# Patient Record
Sex: Female | Born: 1950
Health system: Southern US, Community
[De-identification: ages and names within clinical notes are randomized; demographics above are authoritative.]

## PROBLEM LIST (undated history)

## (undated) DIAGNOSIS — J302 Other seasonal allergic rhinitis: Secondary | ICD-10-CM

## (undated) DIAGNOSIS — H9190 Unspecified hearing loss, unspecified ear: Secondary | ICD-10-CM

## (undated) DIAGNOSIS — Z803 Family history of malignant neoplasm of breast: Secondary | ICD-10-CM

## (undated) DIAGNOSIS — K469 Unspecified abdominal hernia without obstruction or gangrene: Secondary | ICD-10-CM

## (undated) DIAGNOSIS — C50919 Malignant neoplasm of unspecified site of unspecified female breast: Secondary | ICD-10-CM

## (undated) DIAGNOSIS — T7840XA Allergy, unspecified, initial encounter: Secondary | ICD-10-CM

## (undated) DIAGNOSIS — C801 Malignant (primary) neoplasm, unspecified: Secondary | ICD-10-CM

## (undated) DIAGNOSIS — Z8619 Personal history of other infectious and parasitic diseases: Secondary | ICD-10-CM

## (undated) DIAGNOSIS — L570 Actinic keratosis: Secondary | ICD-10-CM

## (undated) DIAGNOSIS — Z801 Family history of malignant neoplasm of trachea, bronchus and lung: Secondary | ICD-10-CM

## (undated) HISTORY — PX: COLONOSCOPY: SHX174

## (undated) HISTORY — DX: Malignant neoplasm of unspecified site of unspecified female breast: C50.919

## (undated) HISTORY — PX: TUBAL LIGATION: SHX77

## (undated) HISTORY — DX: Allergy, unspecified, initial encounter: T78.40XA

## (undated) HISTORY — DX: Other seasonal allergic rhinitis: J30.2

## (undated) HISTORY — DX: Unspecified hearing loss, unspecified ear: H91.90

## (undated) HISTORY — DX: Family history of malignant neoplasm of breast: Z80.3

## (undated) HISTORY — DX: Family history of malignant neoplasm of trachea, bronchus and lung: Z80.1

## (undated) HISTORY — DX: Personal history of other infectious and parasitic diseases: Z86.19

## (undated) HISTORY — DX: Actinic keratosis: L57.0

## (undated) HISTORY — PX: EYE SURGERY: SHX253

---

## 1995-07-13 DIAGNOSIS — E669 Obesity, unspecified: Secondary | ICD-10-CM | POA: Insufficient documentation

## 1997-07-03 DIAGNOSIS — K219 Gastro-esophageal reflux disease without esophagitis: Secondary | ICD-10-CM | POA: Insufficient documentation

## 1999-08-04 DIAGNOSIS — E782 Mixed hyperlipidemia: Secondary | ICD-10-CM | POA: Insufficient documentation

## 2000-06-15 DIAGNOSIS — D539 Nutritional anemia, unspecified: Secondary | ICD-10-CM | POA: Insufficient documentation

## 2000-07-18 DIAGNOSIS — Z86010 Personal history of colonic polyps: Secondary | ICD-10-CM | POA: Insufficient documentation

## 2000-09-07 ENCOUNTER — Other Ambulatory Visit: Admission: RE | Admit: 2000-09-07 | Discharge: 2000-09-07 | Payer: Self-pay | Admitting: Family Medicine

## 2001-01-25 DIAGNOSIS — J302 Other seasonal allergic rhinitis: Secondary | ICD-10-CM | POA: Insufficient documentation

## 2003-06-07 LAB — HM DEXA SCAN: HM DEXA SCAN: NORMAL

## 2004-06-18 ENCOUNTER — Ambulatory Visit: Payer: Self-pay | Admitting: Family Medicine

## 2005-08-05 ENCOUNTER — Ambulatory Visit: Payer: Self-pay | Admitting: Family Medicine

## 2006-07-28 ENCOUNTER — Ambulatory Visit: Payer: Self-pay | Admitting: Gastroenterology

## 2006-08-17 ENCOUNTER — Ambulatory Visit: Payer: Self-pay | Admitting: Family Medicine

## 2006-08-22 ENCOUNTER — Ambulatory Visit: Payer: Self-pay | Admitting: Family Medicine

## 2007-08-23 ENCOUNTER — Ambulatory Visit: Payer: Self-pay | Admitting: Family Medicine

## 2007-09-20 DIAGNOSIS — G43909 Migraine, unspecified, not intractable, without status migrainosus: Secondary | ICD-10-CM | POA: Insufficient documentation

## 2007-10-02 ENCOUNTER — Emergency Department: Payer: Self-pay | Admitting: Unknown Physician Specialty

## 2007-10-02 ENCOUNTER — Other Ambulatory Visit: Payer: Self-pay

## 2008-09-03 DIAGNOSIS — R030 Elevated blood-pressure reading, without diagnosis of hypertension: Secondary | ICD-10-CM | POA: Insufficient documentation

## 2008-09-03 DIAGNOSIS — I10 Essential (primary) hypertension: Secondary | ICD-10-CM | POA: Insufficient documentation

## 2008-09-05 ENCOUNTER — Ambulatory Visit: Payer: Self-pay | Admitting: Family Medicine

## 2009-10-28 ENCOUNTER — Ambulatory Visit: Payer: Self-pay | Admitting: Family Medicine

## 2010-11-06 ENCOUNTER — Ambulatory Visit: Payer: Self-pay | Admitting: Family Medicine

## 2011-04-21 LAB — HM DEXA SCAN: HM DEXA SCAN: NORMAL

## 2011-11-23 ENCOUNTER — Ambulatory Visit: Payer: Self-pay | Admitting: Family Medicine

## 2012-11-13 ENCOUNTER — Ambulatory Visit: Payer: Self-pay | Admitting: Podiatry

## 2012-11-20 ENCOUNTER — Encounter: Payer: Self-pay | Admitting: Podiatry

## 2012-11-20 ENCOUNTER — Ambulatory Visit (INDEPENDENT_AMBULATORY_CARE_PROVIDER_SITE_OTHER): Payer: BC Managed Care – PPO | Admitting: Podiatry

## 2012-11-20 VITALS — BP 123/74 | HR 69 | Resp 16 | Ht 63.0 in | Wt 175.0 lb

## 2012-11-20 DIAGNOSIS — M722 Plantar fascial fibromatosis: Secondary | ICD-10-CM

## 2012-11-20 NOTE — Progress Notes (Signed)
Berdina presents today for followup of her plantar fasciitis right foot. She states that sometimes is good and sometimes as bad. She did fine walking at the fair but the next day she knew it and her feet were hurting.  Objective: Pulses are palpable bilateral. Pain on palpation medial calcaneal tubercle right.  Assessment: Plantar fasciitis right.  Plan: Skin her today for a pair orthotics she will continue her anti-inflammatories and all other conservative therapies followup with her once her orthotics come in.

## 2012-11-20 NOTE — Patient Instructions (Signed)
Plantar Fasciitis (Heel Spur Syndrome) with Rehab The plantar fascia is a fibrous, ligament-like, soft-tissue structure that spans the bottom of the foot. Plantar fasciitis is a condition that causes pain in the foot due to inflammation of the tissue. SYMPTOMS   Pain and tenderness on the underneath side of the foot.  Pain that worsens with standing or walking. CAUSES  Plantar fasciitis is caused by irritation and injury to the plantar fascia on the underneath side of the foot. Common mechanisms of injury include:  Direct trauma to bottom of the foot.  Damage to a small nerve that runs under the foot where the main fascia attaches to the heel bone.  Stress placed on the plantar fascia due to bone spurs. RISK INCREASES WITH:   Activities that place stress on the plantar fascia (running, jumping, pivoting, or cutting).  Poor strength and flexibility.  Improperly fitted shoes.  Tight calf muscles.  Flat feet.  Failure to warm-up properly before activity.  Obesity. PREVENTION  Warm up and stretch properly before activity.  Allow for adequate recovery between workouts.  Maintain physical fitness:  Strength, flexibility, and endurance.  Cardiovascular fitness.  Maintain a health body weight.  Avoid stress on the plantar fascia.  Wear properly fitted shoes, including arch supports for individuals who have flat feet. PROGNOSIS  If treated properly, then the symptoms of plantar fasciitis usually resolve without surgery. However, occasionally surgery is necessary. RELATED COMPLICATIONS   Recurrent symptoms that may result in a chronic condition.  Problems of the lower back that are caused by compensating for the injury, such as limping.  Pain or weakness of the foot during push-off following surgery.  Chronic inflammation, scarring, and partial or complete fascia tear, occurring more often from repeated injections. TREATMENT  Treatment initially involves the use of  ice and medication to help reduce pain and inflammation. The use of strengthening and stretching exercises may help reduce pain with activity, especially stretches of the Achilles tendon. These exercises may be performed at home or with a therapist. Your caregiver may recommend that you use heel cups of arch supports to help reduce stress on the plantar fascia. Occasionally, corticosteroid injections are given to reduce inflammation. If symptoms persist for greater than 6 months despite non-surgical (conservative), then surgery may be recommended.  MEDICATION   If pain medication is necessary, then nonsteroidal anti-inflammatory medications, such as aspirin and ibuprofen, or other minor pain relievers, such as acetaminophen, are often recommended.  Do not take pain medication within 7 days before surgery.  Prescription pain relievers may be given if deemed necessary by your caregiver. Use only as directed and only as much as you need.  Corticosteroid injections may be given by your caregiver. These injections should be reserved for the most serious cases, because they may only be given a certain number of times. HEAT AND COLD  Cold treatment (icing) relieves pain and reduces inflammation. Cold treatment should be applied for 10 to 15 minutes every 2 to 3 hours for inflammation and pain and immediately after any activity that aggravates your symptoms. Use ice packs or massage the area with a piece of ice (ice massage).  Heat treatment may be used prior to performing the stretching and strengthening activities prescribed by your caregiver, physical therapist, or athletic trainer. Use a heat pack or soak the injury in warm water. SEEK IMMEDIATE MEDICAL CARE IF:  Treatment seems to offer no benefit, or the condition worsens.  Any medications produce adverse side effects. EXERCISES RANGE   OF MOTION (ROM) AND STRETCHING EXERCISES - Plantar Fasciitis (Heel Spur Syndrome) These exercises may help you  when beginning to rehabilitate your injury. Your symptoms may resolve with or without further involvement from your physician, physical therapist or athletic trainer. While completing these exercises, remember:   Restoring tissue flexibility helps normal motion to return to the joints. This allows healthier, less painful movement and activity.  An effective stretch should be held for at least 30 seconds.  A stretch should never be painful. You should only feel a gentle lengthening or release in the stretched tissue. RANGE OF MOTION - Toe Extension, Flexion  Sit with your right / left leg crossed over your opposite knee.  Grasp your toes and gently pull them back toward the top of your foot. You should feel a stretch on the bottom of your toes and/or foot.  Hold this stretch for __________ seconds.  Now, gently pull your toes toward the bottom of your foot. You should feel a stretch on the top of your toes and or foot.  Hold this stretch for __________ seconds. Repeat __________ times. Complete this stretch __________ times per day.  RANGE OF MOTION - Ankle Dorsiflexion, Active Assisted  Remove shoes and sit on a chair that is preferably not on a carpeted surface.  Place right / left foot under knee. Extend your opposite leg for support.  Keeping your heel down, slide your right / left foot back toward the chair until you feel a stretch at your ankle or calf. If you do not feel a stretch, slide your bottom forward to the edge of the chair, while still keeping your heel down.  Hold this stretch for __________ seconds. Repeat __________ times. Complete this stretch __________ times per day.  STRETCH  Gastroc, Standing  Place hands on wall.  Extend right / left leg, keeping the front knee somewhat bent.  Slightly point your toes inward on your back foot.  Keeping your right / left heel on the floor and your knee straight, shift your weight toward the wall, not allowing your back to  arch.  You should feel a gentle stretch in the right / left calf. Hold this position for __________ seconds. Repeat __________ times. Complete this stretch __________ times per day. STRETCH  Soleus, Standing  Place hands on wall.  Extend right / left leg, keeping the other knee somewhat bent.  Slightly point your toes inward on your back foot.  Keep your right / left heel on the floor, bend your back knee, and slightly shift your weight over the back leg so that you feel a gentle stretch deep in your back calf.  Hold this position for __________ seconds. Repeat __________ times. Complete this stretch __________ times per day. STRETCH  Gastrocsoleus, Standing  Note: This exercise can place a lot of stress on your foot and ankle. Please complete this exercise only if specifically instructed by your caregiver.   Place the ball of your right / left foot on a step, keeping your other foot firmly on the same step.  Hold on to the wall or a rail for balance.  Slowly lift your other foot, allowing your body weight to press your heel down over the edge of the step.  You should feel a stretch in your right / left calf.  Hold this position for __________ seconds.  Repeat this exercise with a slight bend in your right / left knee. Repeat __________ times. Complete this stretch __________ times per day.    STRENGTHENING EXERCISES - Plantar Fasciitis (Heel Spur Syndrome)  These exercises may help you when beginning to rehabilitate your injury. They may resolve your symptoms with or without further involvement from your physician, physical therapist or athletic trainer. While completing these exercises, remember:   Muscles can gain both the endurance and the strength needed for everyday activities through controlled exercises.  Complete these exercises as instructed by your physician, physical therapist or athletic trainer. Progress the resistance and repetitions only as guided. STRENGTH - Towel  Curls  Sit in a chair positioned on a non-carpeted surface.  Place your foot on a towel, keeping your heel on the floor.  Pull the towel toward your heel by only curling your toes. Keep your heel on the floor.  If instructed by your physician, physical therapist or athletic trainer, add ____________________ at the end of the towel. Repeat __________ times. Complete this exercise __________ times per day. STRENGTH - Ankle Inversion  Secure one end of a rubber exercise band/tubing to a fixed object (table, pole). Loop the other end around your foot just before your toes.  Place your fists between your knees. This will focus your strengthening at your ankle.  Slowly, pull your big toe up and in, making sure the band/tubing is positioned to resist the entire motion.  Hold this position for __________ seconds.  Have your muscles resist the band/tubing as it slowly pulls your foot back to the starting position. Repeat __________ times. Complete this exercises __________ times per day.  Document Released: 01/04/2005 Document Revised: 03/29/2011 Document Reviewed: 04/18/2008 [employer] Patient Information 2014 [employer]. Plantar Fasciitis Plantar fasciitis is a common condition that causes foot pain. It is soreness (inflammation) of the band of tough fibrous tissue on the bottom of the foot that runs from the heel bone (calcaneus) to the ball of the foot. The cause of this soreness may be from excessive standing, poor fitting shoes, running on hard surfaces, being overweight, having an abnormal walk, or overuse (this is common in runners) of the painful foot or feet. It is also common in aerobic exercise dancers and ballet dancers. SYMPTOMS  Most people with plantar fasciitis complain of:  Severe pain in the morning on the bottom of their foot especially when taking the first steps out of bed. This pain recedes after a few minutes of walking.  Severe pain is experienced also during walking  following a long period of inactivity.  Pain is worse when walking barefoot or up stairs DIAGNOSIS   Your caregiver will diagnose this condition by examining and feeling your foot.  Special tests such as X-rays of your foot, are usually not needed. PREVENTION   Consult a sports medicine professional before beginning a new exercise program.  Walking programs offer a good workout. With walking there is a lower chance of overuse injuries common to runners. There is less impact and less jarring of the joints.  Begin all new exercise programs slowly. If problems or pain develop, decrease the amount of time or distance until you are at a comfortable level.  Wear good shoes and replace them regularly.  Stretch your foot and the heel cords at the back of the ankle (Achilles tendon) both before and after exercise.  Run or exercise on even surfaces that are not hard. For example, asphalt is better than pavement.  Do not run barefoot on hard surfaces.  If using a treadmill, vary the incline.  Do not continue to workout if you have foot or joint   problems. Seek professional help if they do not improve. HOME CARE INSTRUCTIONS   Avoid activities that cause you pain until you recover.  Use ice or cold packs on the problem or painful areas after working out.  Only take over-the-counter or prescription medicines for pain, discomfort, or fever as directed by your caregiver.  Soft shoe inserts or athletic shoes with air or gel sole cushions may be helpful.  If problems continue or become more severe, consult a sports medicine caregiver or your own health care provider. Cortisone is a potent anti-inflammatory medication that may be injected into the painful area. You can discuss this treatment with your caregiver. MAKE SURE YOU:   Understand these instructions.  Will watch your condition.  Will get help right away if you are not doing well or get worse. Document Released: 09/29/2000 Document  Revised: 03/29/2011 Document Reviewed: 11/29/2007 Exit[employer] Patient Information 2014 [employer].  

## 2012-11-23 ENCOUNTER — Ambulatory Visit: Payer: Self-pay | Admitting: Family Medicine

## 2012-12-18 ENCOUNTER — Ambulatory Visit (INDEPENDENT_AMBULATORY_CARE_PROVIDER_SITE_OTHER): Payer: BC Managed Care – PPO | Admitting: Podiatry

## 2012-12-18 DIAGNOSIS — M778 Other enthesopathies, not elsewhere classified: Secondary | ICD-10-CM

## 2012-12-18 DIAGNOSIS — M7750 Other enthesopathy of unspecified foot: Secondary | ICD-10-CM

## 2012-12-18 NOTE — Patient Instructions (Signed)

## 2012-12-18 NOTE — Progress Notes (Signed)
Pt presents for orthotic pick up , verbal and written instructions given 

## 2013-01-15 ENCOUNTER — Ambulatory Visit: Payer: BC Managed Care – PPO | Admitting: Podiatry

## 2014-01-29 LAB — LIPID PANEL
Cholesterol: 249 mg/dL — AB (ref 0–200)
HDL: 66 mg/dL (ref 35–70)
LDL Cholesterol: 155 mg/dL
LDl/HDL Ratio: 2.3
Triglycerides: 140 mg/dL (ref 40–160)

## 2014-01-29 LAB — CBC AND DIFFERENTIAL
HCT: 40 % (ref 36–46)
HEMOGLOBIN: 13.5 g/dL (ref 12.0–16.0)
NEUTROS ABS: 1 /uL
PLATELETS: 244 10*3/uL (ref 150–399)
WBC: 6.1 10^3/mL

## 2014-01-29 LAB — BASIC METABOLIC PANEL
BUN: 15 mg/dL (ref 4–21)
Creatinine: 0.7 mg/dL (ref 0.5–1.1)

## 2014-01-29 LAB — TSH: TSH: 0.9 u[IU]/mL (ref 0.41–5.90)

## 2014-01-29 LAB — HEPATIC FUNCTION PANEL
ALT: 23 U/L (ref 7–35)
AST: 21 U/L (ref 13–35)
Alkaline Phosphatase: 93 U/L (ref 25–125)
Bilirubin, Total: 0.4 mg/dL

## 2014-02-15 ENCOUNTER — Ambulatory Visit: Payer: Self-pay | Admitting: Family Medicine

## 2014-12-26 LAB — HM COLONOSCOPY

## 2015-01-27 ENCOUNTER — Encounter: Payer: Self-pay | Admitting: Family Medicine

## 2015-01-29 ENCOUNTER — Encounter: Payer: Self-pay | Admitting: Family Medicine

## 2015-01-29 ENCOUNTER — Ambulatory Visit (INDEPENDENT_AMBULATORY_CARE_PROVIDER_SITE_OTHER): Payer: BLUE CROSS/BLUE SHIELD | Admitting: Family Medicine

## 2015-01-29 VITALS — BP 110/62 | HR 62 | Temp 98.4°F | Resp 16 | Ht 62.5 in | Wt 165.0 lb

## 2015-01-29 DIAGNOSIS — Z1239 Encounter for other screening for malignant neoplasm of breast: Secondary | ICD-10-CM

## 2015-01-29 DIAGNOSIS — Z0000 Encounter for general adult medical examination without abnormal findings: Secondary | ICD-10-CM | POA: Diagnosis not present

## 2015-01-29 NOTE — Progress Notes (Signed)
Patient ID: Elizabeth Morgan, female   DOB: November 20, 1950, 65 y.o.   MRN: [id number]       Patient: Elizabeth Morgan, Female    DOB: 03/06/1950, 65 y.o.   MRN: [id number] Visit Date: 01/29/2015  Today's Provider: Wilhemena Durie, MD   Chief Complaint  Patient presents with  . Annual Exam   Subjective:    Annual physical exam Elizabeth Morgan is a 65 y.o. female who presents today for health maintenance and complete physical. She feels fairly well. She reports she is exercising when she can, she has been sick with a URI on and off since December. She reports she is sleeping well.  ----------------------------------------------------------------- Mammogram- 02/15/14- WNL BMD- 04/21/11-Normal Colonoscopy- 01/01/10  Pap- 04/13/11- Normal  Pt reports that she has been battling a URI since about Christmas. She is coughing up clear/yellow sputum. She has nasal congestion runny nose. She does not have any sinus pain for pressure right now but she has been having it. She is hoarse today and she says that comes and goes.  Review of Systems  Constitutional: Positive for fatigue.  HENT: Positive for congestion and rhinorrhea.   Eyes: Negative.   Respiratory: Positive for cough.   Cardiovascular: Negative.   Gastrointestinal: Negative.   Endocrine: Negative.   Genitourinary: Negative.   Musculoskeletal: Negative.   Skin: Negative.   Allergic/Immunologic: Negative.   Neurological: Negative.   Hematological: Negative.   Psychiatric/Behavioral: Negative.     Social History      She  reports that she has quit smoking. She has never used smokeless tobacco. She reports that she drinks alcohol. She reports that she does not use illicit drugs.       Social History   Social History  . Marital Status: Married    Spouse Name: N/A  . Number of Children: N/A  . Years of Education: N/A   Social History Main Topics  . Smoking status: Former Smoker -- 10 years  . Smokeless tobacco: Never  Used     Comment: Quit about 30 years ago  . Alcohol Use: Yes     Comment: occasionally  . Drug Use: No  . Sexual Activity: Not Asked   Other Topics Concern  . None   Social History Narrative    Past Medical History  Diagnosis Date  . Hearing loss   . Seasonal allergies      Patient Active Problem List   Diagnosis Date Noted  . Essential (primary) hypertension 09/03/2008  . Headache, migraine 09/20/2007  . Allergic rhinitis, seasonal 01/25/2001  . History of colon polyps 07/18/2000  . Deficiency anemia 06/15/2000  . Combined fat and carbohydrate induced hyperlipemia 08/04/1999  . Acid reflux 07/03/1997  . Adiposity 07/13/1995    Past Surgical History  Procedure Laterality Date  . Cesarean section    . Tubal ligation    . Eye surgery Bilateral     X 2    Family History        Family Status  Relation Status Death Age  . Mother Alive   . Father Deceased 59  . Sister Deceased   . Son Alive         Her family history includes Breast cancer in her sister; Dementia in her father and mother; Heart disease in her father; Hypertension in her father; Lung cancer in her father.    Allergies  Allergen Reactions  . Aleve [Naproxen Sodium] Other (See Comments)    BURN STOMACH  . Aspirin  Other (See Comments)    BURN STOMACH  . Chicken Protein   . Codeine Nausea And Vomiting    Previous Medications   CELECOXIB (CELEBREX) 200 MG CAPSULE    Take 200 mg by mouth daily. PRN   CETIRIZINE HCL (ZYRTEC ALLERGY PO)    Take by mouth daily.    Patient Care Team: Jerrol Banana., MD as PCP - General (Family Medicine)     Objective:   Vitals: BP 110/62 mmHg  Pulse 62  Temp(Src) 98.4 F (36.9 C) (Oral)  Resp 16  Ht 5' 2.5" (1.588 m)  Wt 165 lb (74.844 kg)  BMI 29.68 kg/m2  SpO2 97%   Physical Exam  Constitutional: She is oriented to person, place, and time. She appears well-developed and well-nourished.  HENT:  Head: Normocephalic and atraumatic.  Right  Ear: External ear normal.  Left Ear: External ear normal.  Nose: Nose normal.  Mouth/Throat: Oropharynx is clear and moist.  Eyes: Conjunctivae and EOM are normal. Pupils are equal, round, and reactive to light.  Neck: Normal range of motion. Neck supple.  Cardiovascular: Normal rate, regular rhythm, normal heart sounds and intact distal pulses.   Breasts are dense but no masses.  Pulmonary/Chest: Effort normal and breath sounds normal.  Abdominal: Soft. Bowel sounds are normal.  Genitourinary:  Deferred until next year. She had pap in 2013.   Musculoskeletal: Normal range of motion.  Neurological: She is alert and oriented to person, place, and time. She has normal reflexes.  Skin: Skin is warm and dry.  Psychiatric: She has a normal mood and affect. Her behavior is normal. Judgment and thought content normal.     Depression Screen No flowsheet data found.    Assessment & Plan:     Routine Health Maintenance and Physical Exam- Pt needs Tdap and Zoster vaccines, will wait on these until she gets over URI or in the spring.  Exercise Activities and Dietary recommendations Goals    None      Immunization History  Administered Date(s) Administered  . Td 05/27/2003    Health Maintenance  Topic Date Due  . Hepatitis C Screening  [birth year]  . HIV Screening  09/25/1967  . PAP SMEAR  09/24/1973  . MAMMOGRAM  09/25/2002  . COLONOSCOPY  09/25/2002  . ZOSTAVAX  09/24/2012  . TETANUS/TDAP  05/26/2013  . INFLUENZA VACCINE  12/29/2015 (Originally 08/19/2014)     colonoscopy December 2016 by Dr. Gustavo Lah. Repeat 2021 for  polyp Discussed health benefits of physical activity, and encouraged her to engage in regular exercise appropriate for her age and condition.   URI --since Christmas Z-Pak if she does not improve. I have done the exam and reviewed the above chart and it is accurate to the best of my  knowledge.  --------------------------------------------------------------------

## 2015-01-30 ENCOUNTER — Telehealth: Payer: Self-pay

## 2015-01-30 LAB — LIPID PANEL WITH LDL/HDL RATIO
Cholesterol, Total: 225 mg/dL — ABNORMAL HIGH (ref 100–199)
HDL: 61 mg/dL (ref >39)
LDL CALC: 143 mg/dL — AB (ref 0–99)
LDl/HDL Ratio: 2.3 ratio units (ref 0.0–3.2)
Triglycerides: 105 mg/dL (ref 0–149)
VLDL CHOLESTEROL CAL: 21 mg/dL (ref 5–40)

## 2015-01-30 LAB — CBC WITH DIFFERENTIAL/PLATELET
BASOS ABS: 0 10*3/uL (ref 0.0–0.2)
BASOS: 0 %
EOS (ABSOLUTE): 0.1 10*3/uL (ref 0.0–0.4)
Eos: 1 %
Hematocrit: 39.7 % (ref 34.0–46.6)
Hemoglobin: 13.5 g/dL (ref 11.1–15.9)
IMMATURE GRANS (ABS): 0 10*3/uL (ref 0.0–0.1)
Immature Granulocytes: 0 %
LYMPHS: 28 %
Lymphocytes Absolute: 2.1 10*3/uL (ref 0.7–3.1)
MCH: 30 pg (ref 26.6–33.0)
MCHC: 34 g/dL (ref 31.5–35.7)
MCV: 88 fL (ref 79–97)
Monocytes Absolute: 0.7 10*3/uL (ref 0.1–0.9)
Monocytes: 9 %
NEUTROS ABS: 4.7 10*3/uL (ref 1.4–7.0)
Neutrophils: 62 %
Platelets: 283 10*3/uL (ref 150–379)
RBC: 4.5 x10E6/uL (ref 3.77–5.28)
RDW: 12.3 % (ref 12.3–15.4)
WBC: 7.6 10*3/uL (ref 3.4–10.8)

## 2015-01-30 LAB — COMPREHENSIVE METABOLIC PANEL
ALBUMIN: 4.4 g/dL (ref 3.6–4.8)
ALK PHOS: 85 IU/L (ref 39–117)
ALT: 16 IU/L (ref 0–32)
AST: 16 IU/L (ref 0–40)
Albumin/Globulin Ratio: 1.8 (ref 1.1–2.5)
BUN / CREAT RATIO: 24 (ref 11–26)
BUN: 15 mg/dL (ref 8–27)
Bilirubin Total: 0.4 mg/dL (ref 0.0–1.2)
CO2: 23 mmol/L (ref 18–29)
CREATININE: 0.63 mg/dL (ref 0.57–1.00)
Calcium: 9.9 mg/dL (ref 8.7–10.3)
Chloride: 98 mmol/L (ref 96–106)
GFR calc Af Amer: 111 mL/min/{1.73_m2} (ref >59)
GFR calc non Af Amer: 96 mL/min/{1.73_m2} (ref >59)
GLUCOSE: 89 mg/dL (ref 65–99)
Globulin, Total: 2.5 g/dL (ref 1.5–4.5)
Potassium: 4.7 mmol/L (ref 3.5–5.2)
Sodium: 138 mmol/L (ref 134–144)
TOTAL PROTEIN: 6.9 g/dL (ref 6.0–8.5)

## 2015-01-30 LAB — TSH: TSH: 0.695 u[IU]/mL (ref 0.450–4.500)

## 2015-01-30 NOTE — Telephone Encounter (Signed)
lmcb-aa

## 2015-01-30 NOTE — Telephone Encounter (Signed)
-----   Message from Jerrol Banana., MD sent at 01/30/2015  7:57 AM EST ----- Labs stable. Diet and exercise for mild elevation of cholesterol

## 2015-01-30 NOTE — Telephone Encounter (Signed)
Pt advised-aa 

## 2015-02-17 ENCOUNTER — Ambulatory Visit: Payer: Self-pay

## 2015-02-17 ENCOUNTER — Ambulatory Visit
Admission: RE | Admit: 2015-02-17 | Discharge: 2015-02-17 | Disposition: A | Discharge status: Home / Self Care | Payer: BLUE CROSS/BLUE SHIELD | Source: Ambulatory Visit | Attending: Family Medicine | Admitting: Family Medicine

## 2015-02-17 DIAGNOSIS — Z1231 Encounter for screening mammogram for malignant neoplasm of breast: Secondary | ICD-10-CM | POA: Diagnosis present

## 2015-02-17 DIAGNOSIS — Z1239 Encounter for other screening for malignant neoplasm of breast: Secondary | ICD-10-CM

## 2015-05-29 ENCOUNTER — Ambulatory Visit: Payer: BLUE CROSS/BLUE SHIELD | Admitting: Family Medicine

## 2015-11-05 DIAGNOSIS — Z8619 Personal history of other infectious and parasitic diseases: Secondary | ICD-10-CM

## 2015-11-05 HISTORY — DX: Personal history of other infectious and parasitic diseases: Z86.19

## 2016-02-03 ENCOUNTER — Other Ambulatory Visit: Payer: Self-pay | Admitting: Family Medicine

## 2016-02-03 ENCOUNTER — Ambulatory Visit (INDEPENDENT_AMBULATORY_CARE_PROVIDER_SITE_OTHER): Payer: BLUE CROSS/BLUE SHIELD | Admitting: Family Medicine

## 2016-02-03 VITALS — BP 116/70 | HR 60 | Temp 98.1°F | Resp 16 | Ht 62.5 in | Wt 148.0 lb

## 2016-02-03 DIAGNOSIS — Z124 Encounter for screening for malignant neoplasm of cervix: Secondary | ICD-10-CM | POA: Diagnosis not present

## 2016-02-03 DIAGNOSIS — Z0000 Encounter for general adult medical examination without abnormal findings: Secondary | ICD-10-CM | POA: Diagnosis not present

## 2016-02-03 DIAGNOSIS — Z1211 Encounter for screening for malignant neoplasm of colon: Secondary | ICD-10-CM

## 2016-02-03 DIAGNOSIS — Z1231 Encounter for screening mammogram for malignant neoplasm of breast: Secondary | ICD-10-CM

## 2016-02-03 LAB — IFOBT (OCCULT BLOOD): IMMUNOLOGICAL FECAL OCCULT BLOOD TEST: NEGATIVE

## 2016-02-03 NOTE — Progress Notes (Signed)
Patient: Elizabeth Morgan, Female    DOB: 1950-03-23, 66 y.o.   MRN: [id number] Visit Date: 02/03/2016  Today's Provider: Wilhemena Durie, MD   Chief Complaint  Patient presents with  . Annual Exam   Subjective:  Elizabeth Morgan is a 66 y.o. female who presents today for health maintenance and complete physical. She feels well. She reports exercising three times per week. She reports she is sleeping well.  Immunization History  Administered Date(s) Administered  . Td 05/27/2003   02/17/15 Mamm 04/21/11 BMD 04/13/11 Pap w/HPV-normal 01/01/10 Colonoscoy-tubular adenoma, patient reports having one with Dr Gustavo Lah in [facility] 12/2014, she will try to obtain records for [place].   Review of Systems  Constitutional: Negative.   HENT: Negative.   Eyes: Negative.   Respiratory: Negative.   Cardiovascular: Negative.   Gastrointestinal: Negative.   Endocrine: Negative.   Genitourinary: Negative.   Musculoskeletal: Negative.   Skin: Negative.   Allergic/Immunologic: Negative.   Neurological: Negative.   Hematological: Negative.   Psychiatric/Behavioral: Negative.     Social History   Social History  . Marital status: Married    Spouse name: N/A  . Number of children: N/A  . Years of education: N/A   Occupational History  . Not on file.   Social History Main Topics  . Smoking status: Former Smoker    Years: 10.00  . Smokeless tobacco: Never Used     Comment: Quit about 30 years ago  . Alcohol use Yes     Comment: occasionally  . Drug use: No  . Sexual activity: Not on file   Other Topics Concern  . Not on file   Social History Narrative  . No narrative on file    Patient Active Problem List   Diagnosis Date Noted  . Essential (primary) hypertension 09/03/2008  . Headache, migraine 09/20/2007  . Allergic rhinitis, seasonal 01/25/2001  . History of colon polyps 07/18/2000  . Deficiency anemia 06/15/2000  . Combined fat and carbohydrate induced hyperlipemia  08/04/1999  . Acid reflux 07/03/1997  . Adiposity 07/13/1995    Past Surgical History:  Procedure Laterality Date  . CESAREAN SECTION    . EYE SURGERY Bilateral    X 2  . TUBAL LIGATION      Her family history includes Breast cancer (age of onset: 55) in her sister; Dementia in her father and mother; Heart disease in her father; Hypertension in her father; Lung cancer in her father.     Outpatient Encounter Prescriptions as of 02/03/2016  Medication Sig  . Cetirizine HCl (ZYRTEC ALLERGY PO) Take by mouth daily.  . [DISCONTINUED] celecoxib (CELEBREX) 200 MG capsule Take 200 mg by mouth daily. PRN   No facility-administered encounter medications on file as of 02/03/2016.     Patient Care Team: Jerrol Banana., MD as PCP - General (Family Medicine)      Objective:   Vitals:  Vitals:   02/03/16 0854  BP: 116/70  Pulse: 60  Resp: 16  Temp: 98.1 F (36.7 C)  TempSrc: Oral  Weight: 148 lb (67.1 kg)  Height: 5' 2.5" (1.588 m)    Physical Exam  Constitutional: She is oriented to person, place, and time. She appears well-developed and well-nourished.  HENT:  Head: Normocephalic and atraumatic.  Right Ear: External ear normal.  Left Ear: External ear normal.  Nose: Nose normal.  Mouth/Throat: Oropharynx is clear and moist.  Eyes: Conjunctivae and EOM are normal. Pupils are equal, round, and reactive to  light.  Neck: Normal range of motion. Neck supple.  Cardiovascular: Normal rate, regular rhythm, normal heart sounds and intact distal pulses.   Pulmonary/Chest: Effort normal and breath sounds normal.  Abdominal: Soft. Bowel sounds are normal.  Genitourinary: Vagina normal and uterus normal.  Musculoskeletal: Normal range of motion.  Neurological: She is alert and oriented to person, place, and time.  Skin: Skin is warm and dry.  Psychiatric: She has a normal mood and affect. Her behavior is normal. Judgment and thought content normal.     Depression Screen No  flowsheet data found.    Assessment & Plan:     Routine Health Maintenance and Physical Exam  Exercise Activities and Dietary recommendations Goals    None      Immunization History  Administered Date(s) Administered  . Td 05/27/2003    Health Maintenance  Topic Date Due  . HIV Screening  09/25/1967  . ZOSTAVAX  09/24/2012  . TETANUS/TDAP  05/26/2013  . PAP SMEAR  04/13/2014  . INFLUENZA VACCINE  08/19/2015  . MAMMOGRAM  02/16/2017  . COLONOSCOPY  01/02/2020  . Hepatitis C Screening  Completed     Discussed health benefits of physical activity, and encouraged her to engage in regular exercise appropriate for her age and condition.    1. Annual physical exam  - CBC with Differential/Platelet - Comprehensive metabolic panel - Lipid Panel With LDL/HDL Ratio - TSH  2. Cervical cancer screening  - Pap IG and HPV (high risk) DNA detection  3. Colon cancer screening  - IFOBT POC (occult bld, rslt in office)   HPI, Exam and A&P Transcribed under the direction and in the presence of Miguel Aschoff, Brooke Bonito., MD. Electronically Signed: Althea Charon, RMA I have done the exam and reviewed the chart and it is accurate to the best of my knowledge. Development worker, community has been used and  any errors in dictation or transcription are unintentional. Miguel Aschoff M.D. [facility]

## 2016-02-04 LAB — COMPREHENSIVE METABOLIC PANEL
ALBUMIN: 4.5 g/dL (ref 3.6–4.8)
ALK PHOS: 82 IU/L (ref 39–117)
ALT: 25 IU/L (ref 0–32)
AST: 28 IU/L (ref 0–40)
Albumin/Globulin Ratio: 2 (ref 1.2–2.2)
BUN / CREAT RATIO: 29 — AB (ref 12–28)
BUN: 19 mg/dL (ref 8–27)
Bilirubin Total: 0.4 mg/dL (ref 0.0–1.2)
CALCIUM: 9.6 mg/dL (ref 8.7–10.3)
CO2: 24 mmol/L (ref 18–29)
CREATININE: 0.66 mg/dL (ref 0.57–1.00)
Chloride: 99 mmol/L (ref 96–106)
GFR calc Af Amer: 109 mL/min/{1.73_m2} (ref >59)
GFR, EST NON AFRICAN AMERICAN: 94 mL/min/{1.73_m2} (ref >59)
GLOBULIN, TOTAL: 2.2 g/dL (ref 1.5–4.5)
GLUCOSE: 92 mg/dL (ref 65–99)
Potassium: 4.8 mmol/L (ref 3.5–5.2)
SODIUM: 141 mmol/L (ref 134–144)
Total Protein: 6.7 g/dL (ref 6.0–8.5)

## 2016-02-04 LAB — CBC WITH DIFFERENTIAL/PLATELET
BASOS ABS: 0 10*3/uL (ref 0.0–0.2)
Basos: 1 %
EOS (ABSOLUTE): 0.1 10*3/uL (ref 0.0–0.4)
EOS: 2 %
HEMATOCRIT: 40.7 % (ref 34.0–46.6)
HEMOGLOBIN: 13.7 g/dL (ref 11.1–15.9)
IMMATURE GRANULOCYTES: 0 %
Immature Grans (Abs): 0 10*3/uL (ref 0.0–0.1)
LYMPHS: 32 %
Lymphocytes Absolute: 2.1 10*3/uL (ref 0.7–3.1)
MCH: 30 pg (ref 26.6–33.0)
MCHC: 33.7 g/dL (ref 31.5–35.7)
MCV: 89 fL (ref 79–97)
MONOCYTES: 12 %
Monocytes Absolute: 0.8 10*3/uL (ref 0.1–0.9)
NEUTROS PCT: 53 %
Neutrophils Absolute: 3.4 10*3/uL (ref 1.4–7.0)
Platelets: 272 10*3/uL (ref 150–379)
RBC: 4.57 x10E6/uL (ref 3.77–5.28)
RDW: 12.8 % (ref 12.3–15.4)
WBC: 6.4 10*3/uL (ref 3.4–10.8)

## 2016-02-04 LAB — LIPID PANEL WITH LDL/HDL RATIO
CHOLESTEROL TOTAL: 225 mg/dL — AB (ref 100–199)
HDL: 76 mg/dL (ref >39)
LDL CALC: 135 mg/dL — AB (ref 0–99)
LDL/HDL RATIO: 1.8 ratio (ref 0.0–3.2)
TRIGLYCERIDES: 69 mg/dL (ref 0–149)
VLDL CHOLESTEROL CAL: 14 mg/dL (ref 5–40)

## 2016-02-04 LAB — TSH: TSH: 1.06 u[IU]/mL (ref 0.450–4.500)

## 2016-02-06 LAB — PAP IG AND HPV HIGH-RISK
HPV, high-risk: NEGATIVE
PAP SMEAR COMMENT: 0

## 2016-02-09 NOTE — Progress Notes (Signed)
Advised  ED 

## 2016-02-18 ENCOUNTER — Encounter: Payer: Self-pay | Admitting: Family Medicine

## 2016-02-27 ENCOUNTER — Ambulatory Visit
Admission: RE | Admit: 2016-02-27 | Discharge: 2016-02-27 | Disposition: A | Discharge status: Home / Self Care | Payer: BLUE CROSS/BLUE SHIELD | Source: Ambulatory Visit | Attending: Family Medicine | Admitting: Family Medicine

## 2016-02-27 DIAGNOSIS — Z1231 Encounter for screening mammogram for malignant neoplasm of breast: Secondary | ICD-10-CM | POA: Insufficient documentation

## 2016-04-20 ENCOUNTER — Other Ambulatory Visit: Payer: Self-pay | Admitting: Podiatry

## 2016-04-21 NOTE — Addendum Note (Signed)
Addended by: Perry Mount on: 04/21/2016 04:26 PM   Modules accepted: Orders

## 2016-05-06 ENCOUNTER — Ambulatory Visit (INDEPENDENT_AMBULATORY_CARE_PROVIDER_SITE_OTHER): Payer: BLUE CROSS/BLUE SHIELD | Admitting: Physician Assistant

## 2016-05-06 ENCOUNTER — Encounter: Payer: Self-pay | Admitting: Physician Assistant

## 2016-05-06 VITALS — BP 110/70 | HR 72 | Temp 98.3°F | Resp 16 | Wt 147.8 lb

## 2016-05-06 DIAGNOSIS — K4090 Unilateral inguinal hernia, without obstruction or gangrene, not specified as recurrent: Secondary | ICD-10-CM | POA: Diagnosis not present

## 2016-05-06 NOTE — Progress Notes (Signed)
       Patient: Elizabeth Morgan Female    DOB: 1950/08/31   66 y.o.   MRN: [id number] Visit Date: 05/06/2016  Today's Provider: Mar Daring, PA-C   Chief Complaint  Patient presents with  . Cyst   Subjective:    HPI Patient here today C/O of "knot" in lower abdominal area pt reports she noticed knot 2 nights ago. Patient denies pain, redness or discharge. Patient reports that when she lays down the knot disappears. Patient reports that she has not tried any OTC medication for this.  She has lost 42 pounds recently with weight watchers.    Allergies  Allergen Reactions  . Aleve [Naproxen Sodium] Other (See Comments)    BURN STOMACH  . Aspirin Other (See Comments)    BURN STOMACH  . Chicken Protein   . Codeine Nausea And Vomiting     Current Outpatient Prescriptions:  .  Cetirizine HCl (ZYRTEC ALLERGY PO), Take by mouth daily., Disp: , Rfl:   Review of Systems  Constitutional: Negative.   Respiratory: Negative.   Cardiovascular: Negative.   Gastrointestinal: Negative.        Lump in left lower abdomen  Musculoskeletal: Negative.     Social History  Substance Use Topics  . Smoking status: Former Smoker    Years: 10.00  . Smokeless tobacco: Never Used     Comment: Quit about 30 years ago  . Alcohol use Yes     Comment: occasionally   Objective:   BP 110/70 (BP Location: Left Arm, Patient Position: Sitting, Cuff Size: Large)   Pulse 72   Temp 98.3 F (36.8 C) (Oral)   Resp 16   Wt 147 lb 12.8 oz (67 kg)   SpO2 98%   BMI 26.60 kg/m  Vitals:   05/06/16 1336  BP: 110/70  Pulse: 72  Resp: 16  Temp: 98.3 F (36.8 C)  TempSrc: Oral  SpO2: 98%  Weight: 147 lb 12.8 oz (67 kg)     Physical Exam  Constitutional: She appears well-developed and well-nourished. No distress.  Cardiovascular: Normal rate, regular rhythm and normal heart sounds.  Exam reveals no gallop and no friction rub.   No murmur heard. Pulmonary/Chest: Effort normal and breath  sounds normal. No respiratory distress. She has no wheezes. She has no rales.  Abdominal: Soft. Bowel sounds are normal. She exhibits no distension. There is no tenderness. There is no rebound and no guarding. A hernia is present. Hernia confirmed positive in the left inguinal area.  Skin: She is not diaphoretic.  Vitals reviewed.      Assessment & Plan:     1. Unilateral inguinal hernia without obstruction or gangrene, recurrence not specified Patient is not having any pain and hernia is reducible. Discussed may just be more noticeable now since she has lost weight. Discussed trying to avoid heavy lifting and straining. Will watch and she is to call if hernia does not reduce or causes pain.        Mar Daring, PA-C  [facility]

## 2016-05-06 NOTE — Patient Instructions (Signed)
Hernia A hernia happens when an organ or tissue inside your body pushes out through a weak spot in the belly (abdomen). Follow these instructions at home:  Avoid stretching or overusing (straining) the muscles near the hernia.  Do not lift anything heavier than 10 lb (4.5 kg).  Use the muscles in your leg when you lift something up. Do not use the muscles in your back.  When you cough, try to cough gently.  Eat a diet that has a lot of fiber. Eat lots of fruits and vegetables.  Drink enough fluids to keep your pee (urine) clear or pale yellow. Try to drink 6-8 glasses of water a day.  Take medicines to make your poop soft (stool softeners) as told by your doctor.  Lose weight, if you are overweight.  Do not use any tobacco products, including cigarettes, chewing tobacco, or electronic cigarettes. If you need help quitting, ask your doctor.  Keep all follow-up visits as told by your doctor. This is important. Contact a doctor if:  The skin by the hernia gets puffy (swollen) or red.  The hernia is painful. Get help right away if:  You have a fever.  You have belly pain that is getting worse.  You feel sick to your stomach (nauseous) or you throw up (vomit).  You cannot push the hernia back in place by gently pressing on it while you are lying down.  The hernia:  Changes in shape or size.  Is stuck outside your belly.  Changes color.  Feels hard or tender. This information is not intended to replace advice given to you by your health care provider. Make sure you discuss any questions you have with your health care provider. Document Released: 06/24/2009 Document Revised: 06/12/2015 Document Reviewed: 11/14/2013 Elsevier Interactive Patient Education  2017 [employer].

## 2016-05-26 ENCOUNTER — Encounter: Payer: Self-pay | Admitting: *Deleted

## 2016-06-03 ENCOUNTER — Ambulatory Visit: Payer: BLUE CROSS/BLUE SHIELD | Admitting: Anesthesiology

## 2016-06-03 ENCOUNTER — Ambulatory Visit
Admission: RE | Admit: 2016-06-03 | Discharge: 2016-06-03 | Disposition: A | Discharge status: Home / Self Care | Payer: BLUE CROSS/BLUE SHIELD | Source: Ambulatory Visit | Attending: Podiatry | Admitting: Podiatry

## 2016-06-03 ENCOUNTER — Encounter
Admission: RE | Disposition: A | Discharge status: Home / Self Care | Payer: Self-pay | Source: Ambulatory Visit | Attending: Podiatry

## 2016-06-03 DIAGNOSIS — Z87891 Personal history of nicotine dependence: Secondary | ICD-10-CM | POA: Diagnosis not present

## 2016-06-03 DIAGNOSIS — M2022 Hallux rigidus, left foot: Secondary | ICD-10-CM | POA: Diagnosis not present

## 2016-06-03 DIAGNOSIS — M2012 Hallux valgus (acquired), left foot: Secondary | ICD-10-CM | POA: Insufficient documentation

## 2016-06-03 DIAGNOSIS — M2042 Other hammer toe(s) (acquired), left foot: Secondary | ICD-10-CM | POA: Diagnosis not present

## 2016-06-03 DIAGNOSIS — I10 Essential (primary) hypertension: Secondary | ICD-10-CM | POA: Diagnosis not present

## 2016-06-03 HISTORY — DX: Unspecified abdominal hernia without obstruction or gangrene: K46.9

## 2016-06-03 HISTORY — PX: WEIL OSTEOTOMY: SHX5044

## 2016-06-03 HISTORY — PX: ARTHRODESIS METATARSALPHALANGEAL JOINT (MTPJ): SHX6566

## 2016-06-03 HISTORY — PX: HAMMER TOE SURGERY: SHX385

## 2016-06-03 SURGERY — FUSION, JOINT, GREAT TOE
Anesthesia: General | Site: Foot | Laterality: Left | Wound class: Clean

## 2016-06-03 MED ORDER — FENTANYL CITRATE (PF) 100 MCG/2ML IJ SOLN
INTRAMUSCULAR | Status: DC | PRN
  Administered 2016-06-03: 25 ug via INTRAVENOUS
  Administered 2016-06-03: 50 ug via INTRAVENOUS

## 2016-06-03 MED ORDER — LIDOCAINE HCL (CARDIAC) 20 MG/ML IV SOLN
INTRAVENOUS | Status: DC | PRN
  Administered 2016-06-03: 50 mg via INTRATRACHEAL

## 2016-06-03 MED ORDER — BUPIVACAINE LIPOSOME 1.3 % IJ SUSP
INTRAMUSCULAR | Status: DC | PRN
  Administered 2016-06-03: 10 mL

## 2016-06-03 MED ORDER — PROMETHAZINE HCL 12.5 MG PO TABS: 12.5000 mg | ORAL_TABLET | Freq: Four times a day (QID) | ORAL | 0 refills | Status: DC | PRN

## 2016-06-03 MED ORDER — OXYCODONE HCL 5 MG/5ML PO SOLN
5.0000 mg | Freq: Once | ORAL | Status: DC | PRN
Start: 2016-06-03 — End: 2016-06-03

## 2016-06-03 MED ORDER — LACTATED RINGERS IV SOLN
INTRAVENOUS | Status: DC
  Administered 2016-06-03: 08:00:00 via INTRAVENOUS

## 2016-06-03 MED ORDER — OXYCODONE HCL 5 MG PO TABS: 5.0000 mg | ORAL_TABLET | Freq: Once | ORAL | Status: DC | PRN

## 2016-06-03 MED ORDER — PROPOFOL 10 MG/ML IV BOLUS
INTRAVENOUS | Status: DC | PRN
Start: 2016-06-03 — End: 2016-06-03
  Administered 2016-06-03: 110 mg via INTRAVENOUS

## 2016-06-03 MED ORDER — BUPIVACAINE HCL (PF) 0.5 % IJ SOLN
INTRAMUSCULAR | Status: DC | PRN
  Administered 2016-06-03: 5 mL

## 2016-06-03 MED ORDER — HYDROCODONE-ACETAMINOPHEN 7.5-325 MG PO TABS: 1.0000 | ORAL_TABLET | Freq: Four times a day (QID) | ORAL | 0 refills | Status: DC | PRN

## 2016-06-03 MED ORDER — LIDOCAINE HCL (PF) 1 % IJ SOLN
INTRAMUSCULAR | Status: DC | PRN
  Administered 2016-06-03: 5 mL

## 2016-06-03 MED ORDER — ONDANSETRON HCL 4 MG/2ML IJ SOLN
INTRAMUSCULAR | Status: DC | PRN
  Administered 2016-06-03: 4 mg via INTRAVENOUS

## 2016-06-03 MED ORDER — GLYCOPYRROLATE 0.2 MG/ML IJ SOLN
INTRAMUSCULAR | Status: DC | PRN
  Administered 2016-06-03: 0.1 mg via INTRAVENOUS

## 2016-06-03 MED ORDER — CEFAZOLIN SODIUM-DEXTROSE 2-4 GM/100ML-% IV SOLN
2.0000 g | INTRAVENOUS | Status: AC
  Administered 2016-06-03: 2 g via INTRAVENOUS

## 2016-06-03 MED ORDER — MIDAZOLAM HCL 5 MG/5ML IJ SOLN
INTRAMUSCULAR | Status: DC | PRN
  Administered 2016-06-03: 2 mg via INTRAVENOUS

## 2016-06-03 MED ORDER — EPHEDRINE SULFATE 50 MG/ML IJ SOLN
INTRAMUSCULAR | Status: DC | PRN
  Administered 2016-06-03 (×4): 5 mg via INTRAVENOUS

## 2016-06-03 MED ORDER — FENTANYL CITRATE (PF) 100 MCG/2ML IJ SOLN: 25.0000 ug | INTRAMUSCULAR | Status: DC | PRN

## 2016-06-03 MED ORDER — DEXAMETHASONE SODIUM PHOSPHATE 4 MG/ML IJ SOLN
INTRAMUSCULAR | Status: DC | PRN
  Administered 2016-06-03: 4 mg via INTRAVENOUS

## 2016-06-03 MED ORDER — CHLORHEXIDINE GLUCONATE 4 % EX LIQD
60.0000 mL | Freq: Once | CUTANEOUS | Status: AC
  Administered 2016-06-03: 4 via TOPICAL

## 2016-06-03 SURGICAL SUPPLY — 89 items
3.0 CANNULATED TWIST DRILL 1.8 ×1 IMPLANT
APL SKNCLS STERI-STRIP NONHPOA (GAUZE/BANDAGES/DRESSINGS) ×1
BANDAGE ELASTIC 4 LF NS (GAUZE/BANDAGES/DRESSINGS) ×2 IMPLANT
BENZOIN TINCTURE PRP APPL 2/3 (GAUZE/BANDAGES/DRESSINGS) ×2 IMPLANT
BIT DRILL 2 FENESTRATED (MISCELLANEOUS) IMPLANT
BIT DRILL CANNULTD 2.6 X 130MM (MISCELLANEOUS) IMPLANT
BIT DRILL SOLID 2.0 X 110MM (DRILL) IMPLANT
BIT DRILLL 2 FENESTRATED (MISCELLANEOUS) ×1
BLADE MED AGGRESSIVE (BLADE) ×1 IMPLANT
BLADE MINI RND TIP GREEN BEAV (BLADE) ×1 IMPLANT
BLADE OSC/SAGITTAL MD 5.5X18 (BLADE) ×1 IMPLANT
BNDG CMPR 75X41 PLY HI ABS (GAUZE/BANDAGES/DRESSINGS) ×1
BNDG CMPR MED 5X4 ELC HKLP NS (GAUZE/BANDAGES/DRESSINGS) ×1
BNDG ESMARK 4X12 TAN STRL LF (GAUZE/BANDAGES/DRESSINGS) ×2 IMPLANT
BNDG GAUZE 4.5X4.1 6PLY STRL (MISCELLANEOUS) ×2 IMPLANT
BNDG STRETCH 4X75 STRL LF (GAUZE/BANDAGES/DRESSINGS) ×2 IMPLANT
BUR SURG 4X8 MED (BURR) IMPLANT
BURR SURG 4X8 MED (BURR) ×2
CANISTER SUCT 1200ML W/VALVE (MISCELLANEOUS) ×2 IMPLANT
CAST PADDING 3X4FT ST 30246 (SOFTGOODS) ×2
CONE REAMER 21 (MISCELLANEOUS) ×1 IMPLANT
COUNTERSICK 4.0 HEADED (MISCELLANEOUS) ×2
COVER LIGHT HANDLE UNIVERSAL (MISCELLANEOUS) ×4 IMPLANT
COVER PIN YLW 0.028-062 (MISCELLANEOUS) ×1 IMPLANT
CUFF TOURN SGL QUICK 18 (TOURNIQUET CUFF) IMPLANT
CUP REAMER 21 (MISCELLANEOUS) ×1 IMPLANT
DRAPE FLUOR MINI C-ARM 54X84 (DRAPES) ×2 IMPLANT
DRILL CANNULATED 2.6 X 130MM (MISCELLANEOUS) ×2
DRILL SOLID 2.0 X 110MM (DRILL) ×2
DRILL WIRE PASS (DRILL) IMPLANT
DURAPREP 26ML APPLICATOR (WOUND CARE) ×2 IMPLANT
GAUZE PETRO XEROFOAM 1X8 (MISCELLANEOUS) ×2 IMPLANT
GAUZE SPONGE 4X4 12PLY STRL (GAUZE/BANDAGES/DRESSINGS) ×2 IMPLANT
GLOVE BIO SURGEON STRL SZ8 (GLOVE) ×3 IMPLANT
GOWN STRL REUS W/ TWL LRG LVL3 (GOWN DISPOSABLE) ×1 IMPLANT
GOWN STRL REUS W/ TWL XL LVL3 (GOWN DISPOSABLE) ×1 IMPLANT
GOWN STRL REUS W/TWL LRG LVL3 (GOWN DISPOSABLE) ×2
GOWN STRL REUS W/TWL XL LVL3 (GOWN DISPOSABLE) ×2
K-WIRE DBL END TROCAR 6X.045 (WIRE)
K-WIRE DBL END TROCAR 6X.062 (WIRE)
K-WIRE DBL TROCAR .045X4 ×2 IMPLANT
K-WIRE SMOOTH 1.6X150MM (WIRE) ×4
K-WIRE SNGL END 1.2X150 (MISCELLANEOUS) ×4
KIT ROOM TURNOVER OR (KITS) ×2 IMPLANT
KWIRE DBL END TROCAR 6X.045 (WIRE) IMPLANT
KWIRE DBL END TROCAR 6X.062 (WIRE) IMPLANT
KWIRE DBL TROCAR .045X4 IMPLANT
KWIRE SMOOTH 1.6X150MM (WIRE) IMPLANT
KWIRE SNGL END 1.2X150 (MISCELLANEOUS) IMPLANT
MEDARTIS K-WIRE ×2 IMPLANT
NDL HYPO 18GX1.5 BLUNT FILL (NEEDLE) IMPLANT
NDL HYPO 25GX1X1/2 BEV (NEEDLE) IMPLANT
NEEDLE HYPO 18GX1.5 BLUNT FILL (NEEDLE) ×2 IMPLANT
NEEDLE HYPO 25GX1X1/2 BEV (NEEDLE) ×2 IMPLANT
NS IRRIG 500ML POUR BTL (IV SOLUTION) ×2 IMPLANT
PACK EXTREMITY ARMC (MISCELLANEOUS) ×2 IMPLANT
PAD CAST CTTN 3X4 STRL (SOFTGOODS) IMPLANT
PAD GROUND ADULT SPLIT (MISCELLANEOUS) ×2 IMPLANT
PADDING CAST COTTON 3X4 STRL (SOFTGOODS) ×2
PLATE MTP MED L AERO DEGREE (Plate) ×1 IMPLANT
RASP SM TEAR CROSS CUT (RASP) ×1 IMPLANT
SCREW 2.7 X 15 NON LOCK (Screw) ×1 IMPLANT
SCREW 4.0X22ST (Screw) ×1 IMPLANT
SCREW CANN 2.2X12 (Screw) ×2 IMPLANT
SCREW COUNTERSINK 4.0 HEADED (MISCELLANEOUS) IMPLANT
SCREW LOCK PLATE R3 2.7X11 (Screw) ×1 IMPLANT
SCREW LOCK PLATE R3 2.7X12 (Screw) ×2 IMPLANT
SCREW LOCK PLATE R3 2.7X16 ×2 IMPLANT
SPLINT CAST 1 STEP 4X30 (MISCELLANEOUS) ×2 IMPLANT
SPLINT FAST PLASTER 5X30 (CAST SUPPLIES) ×1
SPLINT PLASTER CAST FAST 5X30 (CAST SUPPLIES) IMPLANT
STOCKINETTE STRL 6IN 960660 (GAUZE/BANDAGES/DRESSINGS) ×2 IMPLANT
STRAP BODY AND KNEE 60X3 (MISCELLANEOUS) ×2 IMPLANT
STRIP CLOSURE SKIN 1/4X4 (GAUZE/BANDAGES/DRESSINGS) ×2 IMPLANT
SUT ETHILON 4-0 (SUTURE)
SUT ETHILON 4-0 FS2 18XMFL BLK (SUTURE)
SUT ETHILON 5-0 FS-2 18 BLK (SUTURE) ×1 IMPLANT
SUT VIC AB 1 CT1 36 (SUTURE) IMPLANT
SUT VIC AB 2-0 CT1 27 (SUTURE)
SUT VIC AB 2-0 CT1 TAPERPNT 27 (SUTURE) IMPLANT
SUT VIC AB 2-0 SH 27 (SUTURE)
SUT VIC AB 2-0 SH 27XBRD (SUTURE) IMPLANT
SUT VIC AB 3-0 SH 27 (SUTURE)
SUT VIC AB 3-0 SH 27X BRD (SUTURE) IMPLANT
SUT VIC AB 4-0 FS2 27 (SUTURE) ×2 IMPLANT
SUT VICRYL AB 3-0 FS1 BRD 27IN (SUTURE) IMPLANT
SUTURE ETHLN 4-0 FS2 18XMF BLK (SUTURE) IMPLANT
SYRINGE 10CC LL (SYRINGE) ×1 IMPLANT
WIRE OLIVE SMOOTH 1.4MMX60MM (WIRE) ×2 IMPLANT

## 2016-06-03 NOTE — Anesthesia Procedure Notes (Addendum)
Procedure Name: LMA Insertion Date/Time: 06/03/2016 10:18 AM Performed by: Mayme Genta Pre-anesthesia Checklist: Patient identified, Emergency Drugs available, Suction available, Timeout performed and Patient being monitored Patient Re-evaluated:Patient Re-evaluated prior to inductionOxygen Delivery Method: Circle system utilized Preoxygenation: Pre-oxygenation with 100% oxygen Intubation Type: IV induction LMA: LMA inserted LMA Size: 4.0 Number of attempts: 1 Placement Confirmation: positive ETCO2 and breath sounds checked- equal and bilateral Tube secured with: Tape

## 2016-06-03 NOTE — Anesthesia Preprocedure Evaluation (Signed)
Anesthesia Evaluation  Patient identified by MRN, date of birth, ID band Patient awake    Reviewed: Allergy & Precautions, H&P , NPO status , Patient's Chart, lab work & pertinent test results  Airway Mallampati: II  TM Distance: >3 FB Neck ROM: full    Dental no notable dental hx.    Pulmonary former smoker,    Pulmonary exam normal        Cardiovascular hypertension, Normal cardiovascular exam     Neuro/Psych    GI/Hepatic Neg liver ROS, Medicated,  Endo/Other  negative endocrine ROS  Renal/GU negative Renal ROS     Musculoskeletal   Abdominal   Peds  Hematology negative hematology ROS (+)   Anesthesia Other Findings   Reproductive/Obstetrics                             Anesthesia Physical Anesthesia Plan  ASA: II  Anesthesia Plan: General LMA   Post-op Pain Management:    Induction:   Airway Management Planned:   Additional Equipment:   Intra-op Plan:   Post-operative Plan:   Informed Consent: I have reviewed the patients History and Physical, chart, labs and discussed the procedure including the risks, benefits and alternatives for the proposed anesthesia with the patient or authorized representative who has indicated his/her understanding and acceptance.     Plan Discussed with:   Anesthesia Plan Comments:         Anesthesia Quick Evaluation

## 2016-06-03 NOTE — Op Note (Signed)
Operative note   Surgeon: Dr. Albertine Patricia, DPM.    Assistant: None    Preop diagnosis: 1 hallux abductovalgus left foot . #2 hallux rigidus left foot. #3 plantar displaced second metatarsal left foot #4 hammertoe deformity second toe left foot    Postop diagnosis: Same    Procedure:   1. Arthrodesis first metatarsal phalangeal joint left foot   2. Second metatarsal osteotomy left foot   3. Hammertoe correction with K wire fixation second toe left foot     EBL: Less than 10 cc    Anesthesia:general delivered by the anesthesia team and local anesthetic preoperatively delivered by me consisting of 10 cc of 0.5% Marcaine plain and 1% lidocaine plain. The end of the case Exprell was injected for longer-lasting anesthetic 10 cc of this was used.    Hemostasis: Ankle tourniquet 225 a little mercury pressure    Specimen: None    Complications: None    Operative indications: Chronic pain and deformity to the left foot resistant to conservative care    Procedure:  Patient was brought into the OR and placed on the operating table in thesupine position. After anesthesia was obtained theleft lower extremity was prepped and draped in usual sterile fashion.  Operative Report: At this time attention was directed to the dorsum of the right foot where a 6 cm layers incision was made over the dorsal aspect of the first MTP joint. Incision was deepened sharp blunt dissection bleeders clamped and bovied as required. Tissue was then 5 incised longitudinally and reflected away from the medial dorsal and dorsal lateral aspect of the metatarsal metatarsophalangeal joint clearly the proximal phalanx. This time a medial and dorsal medial prominence of bone was removed from the joint. The first metatarsal head was identified and a K wire was drilled through the central portion of serve as a guide for the cup and cone system for denuding the articular cartilage. The cup was used first and this was on the  metatarsal head was used to do a nice job of denuding the cartilage and removing in toto down to subchondral bleeding bone. The cup portion was then used on the proximal phalanx accomplished the same goal. Once cartilage was adequately removed the areas almost of the joint were fenestrated with a 1.5 drill bit. Once this was done multiple areas the toe was positioned and placed in a good corrected position for functionality with the arthrodesis. Once those in good position and a K wire was used across the region in accordance with the [employer] first MTPJ fusion plate. The areas checked FluoroScan plate was stabilized with 2 olive pins. Once this was complete and in good position and checked with FluoroScan the screw was then run across the MTPJ fusion site from distal medial to proximal lateral. A good fixation was noted. Good compression was noted across areas well. This point a 6 hole plate was placed across the area and fixated with 6 screws the distal 5 screws were locking screws the proximal screw was nonlocking screw. There is checked FluoroScan good position and correction were noted the screws and plate seen to be a good position the great toe was in excellent alignment with the other digits and good reduction of the structural deformity was noted. Prior to cartilage removal of is noted there was a large swath of articular cartilage damage to the first metatarsal head where at Central portion was completely denuded of articular cartilage. At this point there was copiously irrigated and capsule  tissue was enclosed with 4 Vicryl in continuous stitch and this was also used to close deep then superficial fascial layers. Skin was closed with 4 Vicryl subcuticular stitch.  Procedure #2 this time to his directed the second metatarsal phalangeal joint region where a linear incision made over this region. It was extended over the PIP joint of the toe also but initial concentration was at the metatarsal head and neck  area. A and extensor hood tendon were hood release was performed and the tendon was retracted laterally periosteum Tissue identified and incised longitudinally freeing the metatarsal head and distal shaft medially laterally and dorsally. An osteotomy was then performed at the osteochondral junction distally and cut was made from dorsal distal plantar proximal in oblique fashion. Once is through and through cut second cut was made to allow for elevation of the metatarsal. This was a little portion was removed and the metatarsal head was placed in a corrected position fixated with 2 of the small wires from the met Artis screw set. This checked FluoroScan good position correction and metatarsal parabola were noted. 2 screws were placed across the area holding the osteotomy stable. There is checked FluoroScan good position correction were noted. This point there was copiously irrigated at this point.  Procedure #3 this time to his directed the PIP joint of the second toe with extension was identified and incised transversely reflected proximally and distally off of the proximal phalanx head and middle phalanx base the articular cartilage and resected with power equipment. A 0.5 K wire was drilled through the middle distal phalanges G's distally and then retrograded in the proximal phalanx while holding the toe in a corrected downward position the pin was then run across the metatarsal phalangeal joint and up the metatarsal shaft. There is checked FluoroScan good position and correction were noted. This point there was copiously irrigated and capsular tissue was closed with 4 Vicryl continuous stitch as were deep superficial fascial layers skin closed with 4 Vicryl with a continuous stitch. At the toe the extensor tendon was sutured with 4-0 Vicryl simple interrupted sutures. 3 separate sutures were utilized. Skin was closed with 5-0 nylon horizontal mattress suture. This 0.10 cc of Exprell was injected at the base the  operative sites. Sterile compressive dressings in placed across wound consisting of Steri-Strips Xeroform gauze 4 x 4's Kling Kerlix the tourniquet was released and prompt complete vascularity seen to return all digits of the left foot. A posterior splint was placed on left foot leg in the operating room. Patient is remain nonweightbearing with this.    Patient tolerated the procedure and anesthesia well.  Was transported from the OR to the PACU with all vital signs stable and vascular status intact. To be discharged per routine protocol.  Will follow up in approximately 1 week in the outpatient clinic.

## 2016-06-03 NOTE — Anesthesia Postprocedure Evaluation (Signed)
Anesthesia Post Note  Patient: Elizabeth Morgan  Procedure(s) Performed: Procedure(s) (LRB): ARTHRODESIS METATARSALPHALANGEAL JOINT (MTPJ) (Left) HAMMER TOE CORRECTION - 2nd left toe (Left) WEIL OSTEOTOMY  SECOND TOE (Left)  Patient location during evaluation: PACU Anesthesia Type: General Level of consciousness: awake and alert Pain management: pain level controlled Vital Signs Assessment: post-procedure vital signs reviewed and stable Respiratory status: spontaneous breathing Cardiovascular status: blood pressure returned to baseline Postop Assessment: no headache Anesthetic complications: no    [name], III,  [name]

## 2016-06-03 NOTE — H&P (Signed)
H and P has been reviewed and no changes are noted.  

## 2016-06-03 NOTE — Discharge Instructions (Signed)
[address] [address] [place]  Please remain nonweightbearing on this foot until seen in the office next week. 1. Take your medication as prescribed.  Pain medication should be taken only as needed.  2. Keep the dressing clean, dry and intact.  3. Keep your foot elevated above the heart level for the first 48 hours.  4. Walking to the bathroom and brief periods of walking are acceptable, unless we have instructed you to be non-weight bearing.  5. Always wear your post-op shoe when walking.  Always use your crutches if you are to be non-weight bearing.  6. Do not take a shower. Baths are permissible as long as the foot is kept out of the water.   7. Every hour you are awake:  - Bend your knee 15 times. - Flex foot 15 times - Massage calf 15 times  8. Call [facility] [phone]) if any of the following problems occur: - You develop a temperature or fever. - The bandage becomes saturated with blood. - Medication does not stop your pain. - Injury of the foot occurs. - Any symptoms of infection including redness, odor, or red streaks running from wound.    General Anesthesia, Adult, Care After These instructions provide you with information about caring for yourself after your procedure. Your health care provider may also give you more specific instructions. Your treatment has been planned according to current medical practices, but problems sometimes occur. Call your health care provider if you have any problems or questions after your procedure. What can I expect after the procedure? After the procedure, it is common to have:  Vomiting.  A sore throat.  Mental slowness. It is common to feel:  Nauseous.  Cold or shivery.  Sleepy.  Tired.  Sore or achy, even in parts of your body where you did not have surgery. Follow these instructions at  home: For at least 24 hours after the procedure:   Do not:  Participate in activities where you could fall or become injured.  Drive.  Use heavy machinery.  Drink alcohol.  Take sleeping pills or medicines that cause drowsiness.  Make important decisions or sign legal documents.  Take care of children on your own.  Rest. Eating and drinking   If you vomit, drink water, juice, or soup when you can drink without vomiting.  Drink enough fluid to keep your urine clear or pale yellow.  Make sure you have little or no nausea before eating solid foods.  Follow the diet recommended by your health care provider. General instructions   Have a responsible adult stay with you until you are awake and alert.  Return to your normal activities as told by your health care provider. Ask your health care provider what activities are safe for you.  Take over-the-counter and prescription medicines only as told by your health care provider.  If you smoke, do not smoke without supervision.  Keep all follow-up visits as told by your health care provider. This is important. Contact a health care provider if:  You continue to have nausea or vomiting at home, and medicines are not helpful.  You cannot drink fluids or start eating again.  You cannot urinate after 8-12 hours.  You develop a skin rash.  You have fever.  You have increasing redness at the site of your procedure. Get help right away if:  You have difficulty breathing.  You have chest pain.  You have unexpected bleeding.  You feel that you are having a life-threatening or urgent problem. This information is not intended to replace advice given to you by your health care provider. Make sure you discuss any questions you have with your health care provider. Document Released: 04/12/2000 Document Revised: 06/09/2015 Document Reviewed: 12/19/2014 Elsevier Interactive Patient Education  2017 [employer].

## 2016-06-03 NOTE — Transfer of Care (Signed)
Immediate Anesthesia Transfer of Care Note  Patient: Elizabeth Morgan  Procedure(s) Performed: Procedure(s): ARTHRODESIS METATARSALPHALANGEAL JOINT (MTPJ) (Left) HAMMER TOE CORRECTION - 2nd left toe (Left) WEIL OSTEOTOMY  SECOND TOE (Left)  Patient Location: PACU  Anesthesia Type: General LMA  Level of Consciousness: awake, alert  and patient cooperative  Airway and Oxygen Therapy: Patient Spontanous Breathing and Patient connected to supplemental oxygen  Post-op Assessment: Post-op Vital signs reviewed, Patient's Cardiovascular Status Stable, Respiratory Function Stable, Patent Airway and No signs of Nausea or vomiting  Post-op Vital Signs: Reviewed and stable  Complications: No apparent anesthesia complications

## 2016-06-04 ENCOUNTER — Encounter: Payer: Self-pay | Admitting: Podiatry

## 2017-02-03 ENCOUNTER — Ambulatory Visit (INDEPENDENT_AMBULATORY_CARE_PROVIDER_SITE_OTHER): Payer: BLUE CROSS/BLUE SHIELD | Admitting: Family Medicine

## 2017-02-03 ENCOUNTER — Other Ambulatory Visit: Payer: Self-pay

## 2017-02-03 ENCOUNTER — Encounter: Payer: Self-pay | Admitting: Family Medicine

## 2017-02-03 VITALS — BP 118/72 | HR 58 | Temp 98.4°F | Resp 12 | Ht 62.0 in | Wt 153.0 lb

## 2017-02-03 DIAGNOSIS — Z0000 Encounter for general adult medical examination without abnormal findings: Secondary | ICD-10-CM

## 2017-02-03 DIAGNOSIS — Z23 Encounter for immunization: Secondary | ICD-10-CM | POA: Diagnosis not present

## 2017-02-03 DIAGNOSIS — Z1211 Encounter for screening for malignant neoplasm of colon: Secondary | ICD-10-CM

## 2017-02-03 NOTE — Progress Notes (Signed)
Patient: Elizabeth Morgan, Female    DOB: 03/03/1950, 67 y.o.   MRN: [id number] Visit Date: 02/03/2017  Today's Provider: Wilhemena Durie, MD   Chief Complaint  Patient presents with  . Annual Exam   Subjective:  Elizabeth Morgan is a 67 y.o. female who presents today for health maintenance and complete physical. She feels well. She reports exercising -deep water aerobics to 2 to 3 times a week, and walking. She reports she is sleeping well.  Last colonoscopy 12/26/14 showed diverticulosis, colonic mucosa with focal glandular hypertrophy-Dr Gustavo Lah. Repeat in 2021. Mammogram 03/01/16 negative. Pap 02/03/16-normal, HPV negative-no vaginal issues today Routine lab work done on 02/03/16 BMD 04/21/11 normal-care everywhere section.  Review of Systems  Constitutional: Negative.   HENT: Negative.   Eyes: Negative.   Respiratory: Negative.   Cardiovascular: Negative.   Gastrointestinal: Negative.   Endocrine: Negative.   Genitourinary: Negative.   Musculoskeletal: Negative.   Skin: Negative.   Allergic/Immunologic: Positive for food allergies.  Neurological: Negative.   Hematological: Negative.   Psychiatric/Behavioral: Negative.     Social History   Socioeconomic History  . Marital status: Married    Spouse name: Not on file  . Number of children: Not on file  . Years of education: Not on file  . Highest education level: Not on file  Social Needs  . Financial resource strain: Not on file  . Food insecurity - worry: Not on file  . Food insecurity - inability: Not on file  . Transportation needs - medical: Not on file  . Transportation needs - non-medical: Not on file  Occupational History  . Not on file  Tobacco Use  . Smoking status: Former Smoker    Years: 10.00    Last attempt to quit: 1998    Years since quitting: 21.0  . Smokeless tobacco: Never Used  . Tobacco comment: Quit about 30 years ago  Substance and Sexual Activity  . Alcohol use: Yes    Comment:  occasionally, 1x/month  . Drug use: No  . Sexual activity: No  Other Topics Concern  . Not on file  Social History Narrative  . Not on file    Patient Active Problem List   Diagnosis Date Noted  . Essential (primary) hypertension 09/03/2008  . Headache, migraine 09/20/2007  . Allergic rhinitis, seasonal 01/25/2001  . History of colon polyps 07/18/2000  . Deficiency anemia 06/15/2000  . Combined fat and carbohydrate induced hyperlipemia 08/04/1999  . Acid reflux 07/03/1997  . Adiposity 07/13/1995    Past Surgical History:  Procedure Laterality Date  . ARTHRODESIS METATARSALPHALANGEAL JOINT (MTPJ) Left 06/03/2016   Procedure: ARTHRODESIS METATARSALPHALANGEAL JOINT (MTPJ);  Surgeon: Albertine Patricia, DPM;  Location: [place];  Service: Podiatry;  Laterality: Left;  . CESAREAN SECTION    . COLONOSCOPY    . EYE SURGERY Bilateral    X 2  . HAMMER TOE SURGERY Left 06/03/2016   Procedure: HAMMER TOE CORRECTION - 2nd left toe;  Surgeon: Albertine Patricia, DPM;  Location: [place] [facility];  Service: Podiatry;  Laterality: Left;  . TUBAL LIGATION    . WEIL OSTEOTOMY Left 06/03/2016   Procedure: WEIL OSTEOTOMY  SECOND TOE;  Surgeon: Albertine Patricia, DPM;  Location: [place];  Service: Podiatry;  Laterality: Left;    Her family history includes Breast cancer (age of onset: 52) in her sister; Dementia in her father and mother; Heart disease in her father; Hypertension in her father; Lung cancer in her father.  Outpatient Encounter Medications as of 02/03/2017  Medication Sig  . Cetirizine HCl (ZYRTEC ALLERGY PO) Take by mouth daily.  . [DISCONTINUED] HYDROcodone-acetaminophen (NORCO) 7.5-325 MG tablet Take 1 tablet by mouth every 6 (six) hours as needed for moderate pain.  . [DISCONTINUED] promethazine (PHENERGAN) 12.5 MG tablet Take 1 tablet (12.5 mg total) by mouth every 6 (six) hours as needed for nausea or vomiting.   No facility-administered encounter  medications on file as of 02/03/2017.     Patient Care Team: Jerrol Banana., MD as PCP - General (Family Medicine)      Objective:   Vitals:  Vitals:   02/03/17 0847  BP: 118/72  Pulse: (!) 58  Resp: 12  Temp: 98.4 F (36.9 C)  Weight: 153 lb (69.4 kg)  Height: 5\' 2"  (1.575 m)    Physical Exam  Constitutional: She is oriented to person, place, and time. She appears well-developed and well-nourished.  HENT:  Head: Normocephalic and atraumatic.  Right Ear: External ear normal.  Left Ear: External ear normal.  Nose: Nose normal.  Mouth/Throat: Oropharynx is clear and moist.  Eyes: Conjunctivae and EOM are normal. Pupils are equal, round, and reactive to light.  Neck: Normal range of motion. Neck supple.  Cardiovascular: Normal rate, regular rhythm, normal heart sounds and intact distal pulses.  Pulmonary/Chest: Effort normal and breath sounds normal.  Abdominal: Soft. Bowel sounds are normal.  Normal exam today. Left inguinal hernia   Musculoskeletal: Normal range of motion.  Neurological: She is alert and oriented to person, place, and time. She has normal reflexes.  Skin: Skin is warm and dry.  Psychiatric: She has a normal mood and affect. Her behavior is normal. Judgment and thought content normal.     Depression Screen PHQ 2/9 Scores 02/03/2017 02/03/2016  PHQ - 2 Score 0 0  PHQ- 9 Score 0 -      Assessment & Plan:   1. Annual physical exam  - CBC with Differential/Platelet - Lipid panel - TSH - Comprehensive metabolic panel  2. Screening for colon cancer  - IFOBT POC (occult bld, rslt in office); Future  3. Need for Tdap vaccination 4.Left Inguinal hernia vs incisional hernia

## 2017-02-04 LAB — TSH: TSH: 1.27 u[IU]/mL (ref 0.450–4.500)

## 2017-02-04 LAB — COMPREHENSIVE METABOLIC PANEL
ALBUMIN: 4.8 g/dL (ref 3.6–4.8)
ALT: 19 IU/L (ref 0–32)
AST: 18 IU/L (ref 0–40)
Albumin/Globulin Ratio: 2.1 (ref 1.2–2.2)
Alkaline Phosphatase: 77 IU/L (ref 39–117)
BUN / CREAT RATIO: 24 (ref 12–28)
BUN: 16 mg/dL (ref 8–27)
Bilirubin Total: 0.4 mg/dL (ref 0.0–1.2)
CALCIUM: 9.7 mg/dL (ref 8.7–10.3)
CO2: 24 mmol/L (ref 20–29)
CREATININE: 0.66 mg/dL (ref 0.57–1.00)
Chloride: 104 mmol/L (ref 96–106)
GFR calc Af Amer: 108 mL/min/{1.73_m2} (ref >59)
GFR, EST NON AFRICAN AMERICAN: 94 mL/min/{1.73_m2} (ref >59)
GLOBULIN, TOTAL: 2.3 g/dL (ref 1.5–4.5)
Glucose: 91 mg/dL (ref 65–99)
Potassium: 4.4 mmol/L (ref 3.5–5.2)
SODIUM: 143 mmol/L (ref 134–144)
TOTAL PROTEIN: 7.1 g/dL (ref 6.0–8.5)

## 2017-02-04 LAB — CBC WITH DIFFERENTIAL/PLATELET
Basophils Absolute: 0 10*3/uL (ref 0.0–0.2)
Basos: 1 %
EOS (ABSOLUTE): 0.1 10*3/uL (ref 0.0–0.4)
Eos: 2 %
HEMOGLOBIN: 13.2 g/dL (ref 11.1–15.9)
Hematocrit: 38.4 % (ref 34.0–46.6)
Immature Grans (Abs): 0 10*3/uL (ref 0.0–0.1)
Immature Granulocytes: 0 %
LYMPHS ABS: 1.6 10*3/uL (ref 0.7–3.1)
Lymphs: 28 %
MCH: 30.4 pg (ref 26.6–33.0)
MCHC: 34.4 g/dL (ref 31.5–35.7)
MCV: 89 fL (ref 79–97)
MONOCYTES: 12 %
Monocytes Absolute: 0.7 10*3/uL (ref 0.1–0.9)
Neutrophils Absolute: 3.4 10*3/uL (ref 1.4–7.0)
Neutrophils: 57 %
Platelets: 239 10*3/uL (ref 150–379)
RBC: 4.34 x10E6/uL (ref 3.77–5.28)
RDW: 13.2 % (ref 12.3–15.4)
WBC: 5.9 10*3/uL (ref 3.4–10.8)

## 2017-02-04 LAB — LIPID PANEL
CHOLESTEROL TOTAL: 237 mg/dL — AB (ref 100–199)
Chol/HDL Ratio: 3 ratio (ref 0.0–4.4)
HDL: 78 mg/dL (ref >39)
LDL CALC: 136 mg/dL — AB (ref 0–99)
Triglycerides: 114 mg/dL (ref 0–149)
VLDL Cholesterol Cal: 23 mg/dL (ref 5–40)

## 2017-02-07 NOTE — Progress Notes (Signed)
Advised  ED 

## 2017-02-15 ENCOUNTER — Other Ambulatory Visit: Payer: Self-pay | Admitting: Family Medicine

## 2017-02-15 DIAGNOSIS — Z1231 Encounter for screening mammogram for malignant neoplasm of breast: Secondary | ICD-10-CM

## 2017-03-04 ENCOUNTER — Ambulatory Visit
Admission: RE | Admit: 2017-03-04 | Discharge: 2017-03-04 | Disposition: A | Discharge status: Home / Self Care | Payer: BLUE CROSS/BLUE SHIELD | Source: Ambulatory Visit | Attending: Family Medicine | Admitting: Family Medicine

## 2017-03-04 DIAGNOSIS — Z1231 Encounter for screening mammogram for malignant neoplasm of breast: Secondary | ICD-10-CM | POA: Diagnosis not present

## 2017-08-14 IMAGING — MG MM DIGITAL SCREENING BILAT W/ CAD
4 series · 4 of 4 positions shown · non-contrast
Comparison: Previous exam(s).

CLINICAL DATA: Screening.

EXAM:
DIGITAL SCREENING BILATERAL MAMMOGRAM WITH CAD

[L MLO]
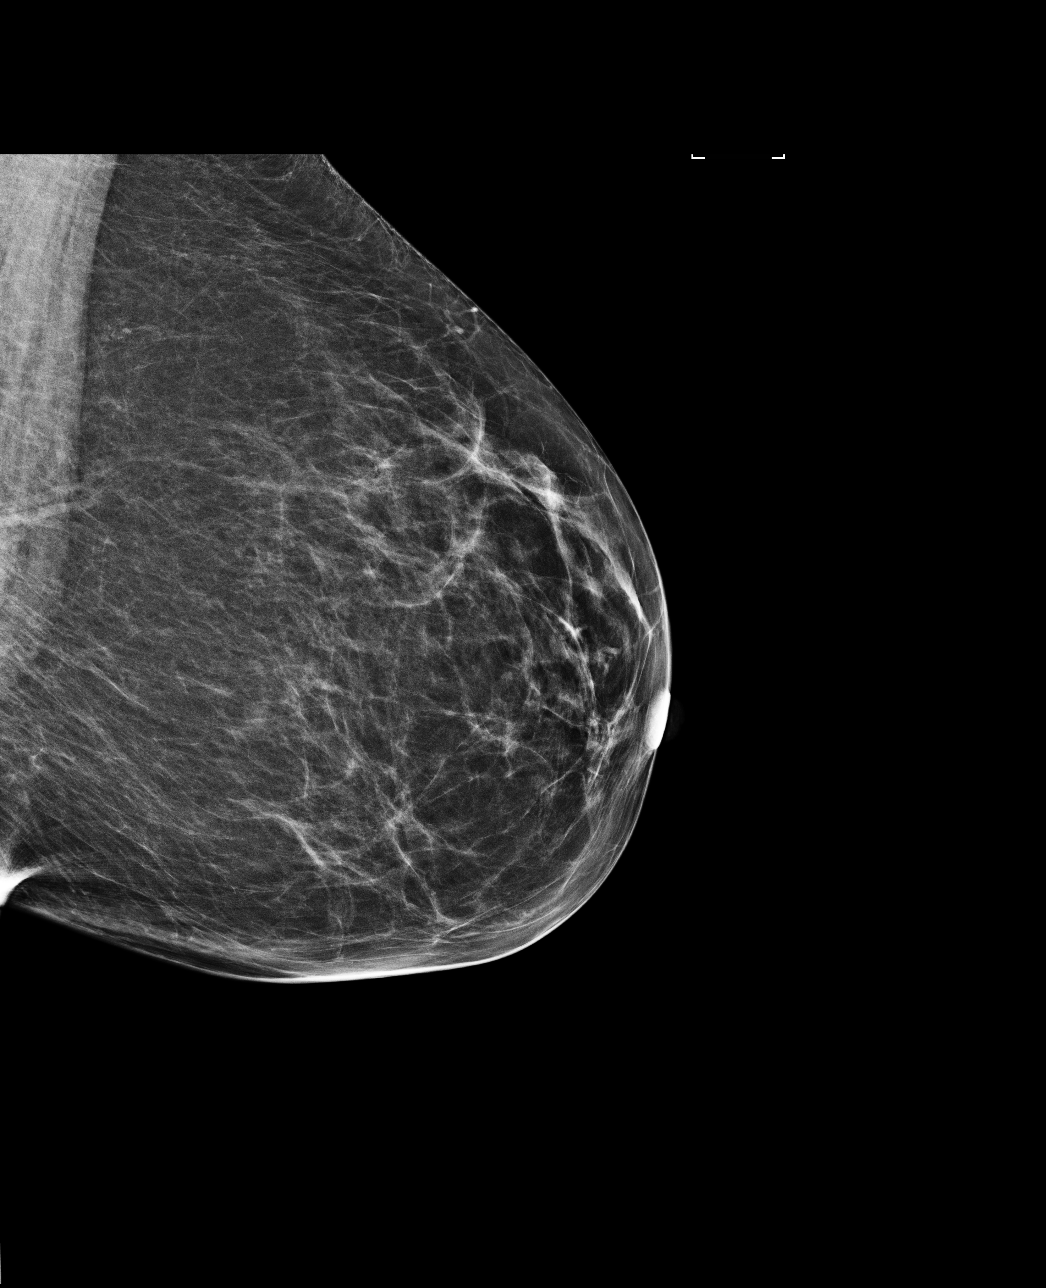

[L CC]
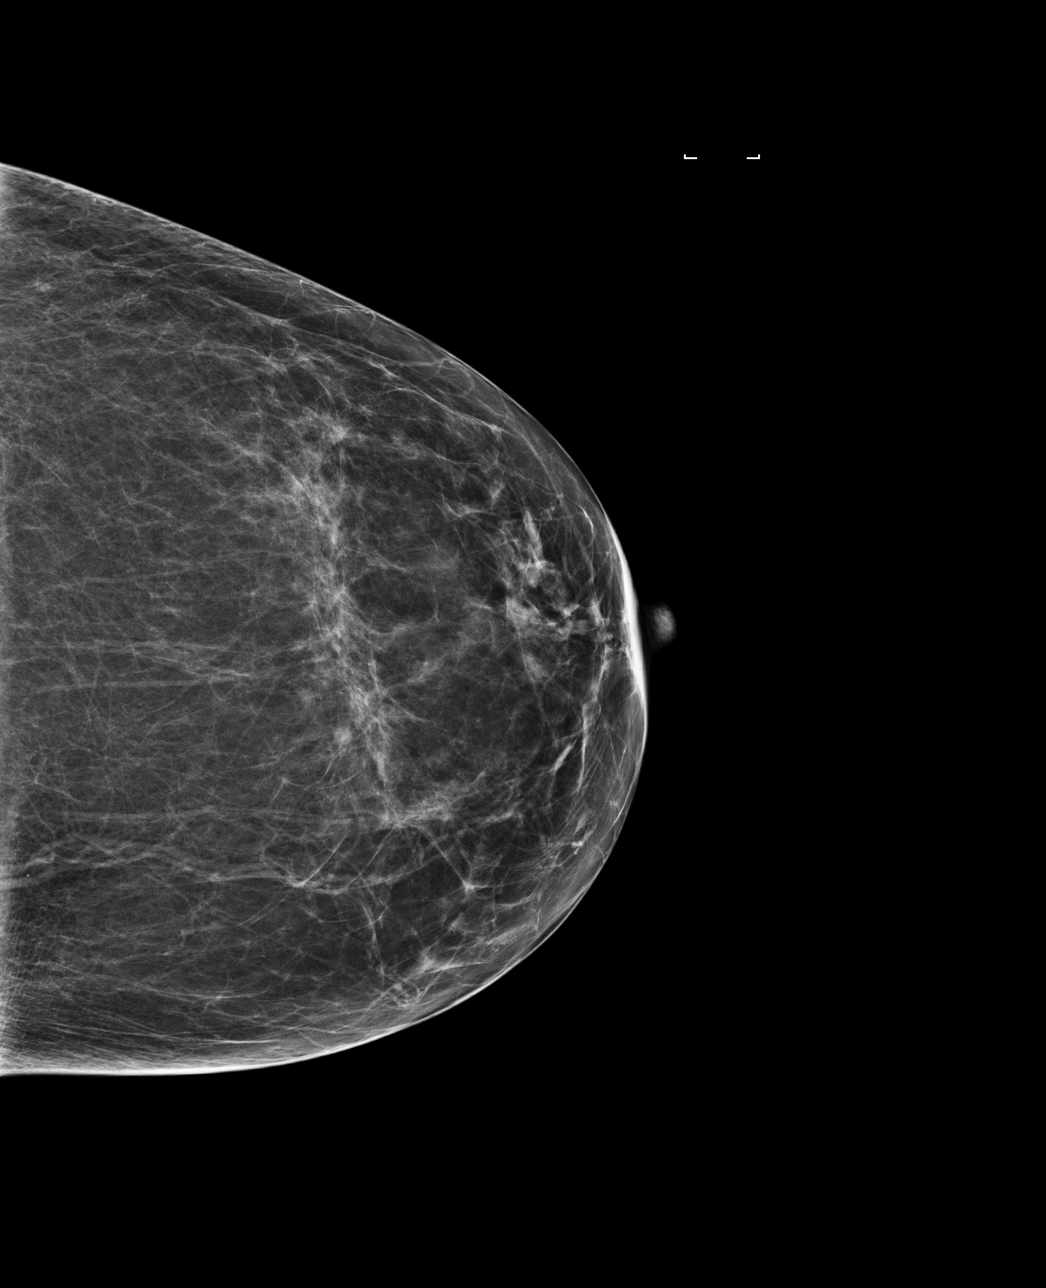

[R MLO]
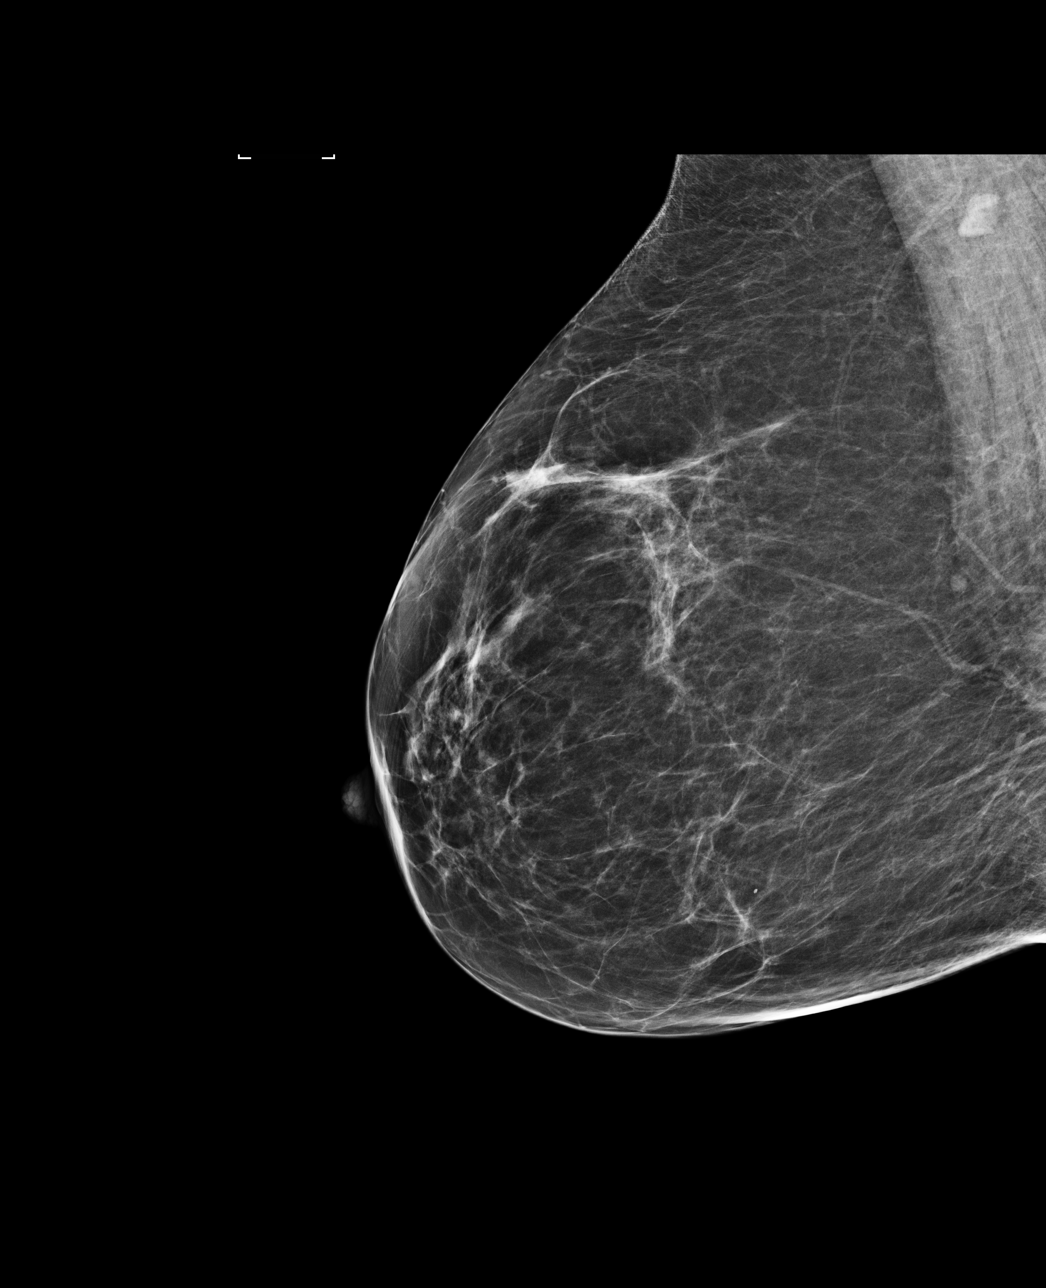

[R CC]
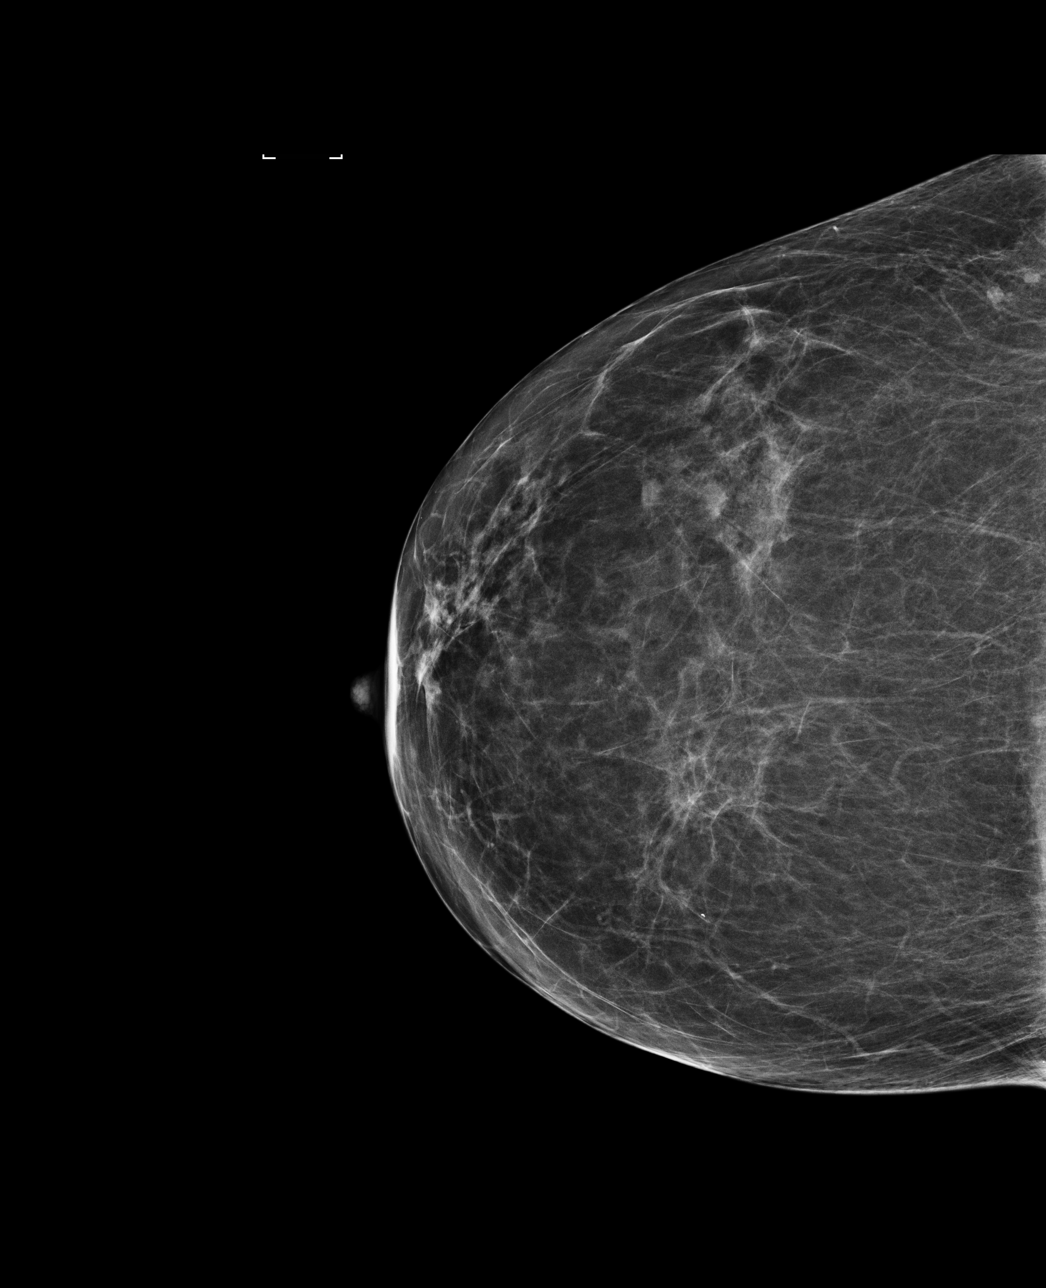

[4 of 4 positions shown; findings below may reference images not displayed]

ACR Breast Density Category b: There are scattered areas of
fibroglandular density.
FINDINGS: There are no findings suspicious for malignancy. Images were
processed with CAD.
IMPRESSION: No mammographic evidence of malignancy. A result letter of this
screening mammogram will be mailed directly to the patient.

RECOMMENDATION:
Screening mammogram in one year. (Code:AS-G-LCT)

BI-RADS CATEGORY  1: Negative.

## 2017-10-13 DIAGNOSIS — L821 Other seborrheic keratosis: Secondary | ICD-10-CM | POA: Diagnosis not present

## 2017-10-13 DIAGNOSIS — L814 Other melanin hyperpigmentation: Secondary | ICD-10-CM | POA: Diagnosis not present

## 2017-10-13 DIAGNOSIS — D229 Melanocytic nevi, unspecified: Secondary | ICD-10-CM | POA: Diagnosis not present

## 2017-10-13 DIAGNOSIS — D1800 Hemangioma unspecified site: Secondary | ICD-10-CM | POA: Diagnosis not present

## 2017-10-13 DIAGNOSIS — Z1283 Encounter for screening for malignant neoplasm of skin: Secondary | ICD-10-CM | POA: Diagnosis not present

## 2018-02-07 ENCOUNTER — Ambulatory Visit (INDEPENDENT_AMBULATORY_CARE_PROVIDER_SITE_OTHER): Payer: Medicare Other | Admitting: Family Medicine

## 2018-02-07 ENCOUNTER — Other Ambulatory Visit: Payer: Self-pay | Admitting: Family Medicine

## 2018-02-07 ENCOUNTER — Encounter: Payer: Self-pay | Admitting: Family Medicine

## 2018-02-07 VITALS — BP 140/84 | HR 62 | Temp 97.8°F | Wt 156.0 lb

## 2018-02-07 DIAGNOSIS — Z0000 Encounter for general adult medical examination without abnormal findings: Secondary | ICD-10-CM | POA: Diagnosis not present

## 2018-02-07 DIAGNOSIS — Z1231 Encounter for screening mammogram for malignant neoplasm of breast: Secondary | ICD-10-CM

## 2018-02-07 DIAGNOSIS — Z1239 Encounter for other screening for malignant neoplasm of breast: Secondary | ICD-10-CM

## 2018-02-07 NOTE — Progress Notes (Signed)
Patient: Elizabeth Morgan, Female    DOB: 03/07/1950, 68 y.o.   MRN: [id number] Visit Date: 02/07/2018  Today's Provider: Wilhemena Durie, MD   Chief Complaint  Patient presents with  . Annual Exam   Subjective:   Initial preventative physical exam Elizabeth Morgan is a 68 y.o. female who presents today for her Initial Preventative Physical Exam. She feels well. She reports exercising includes deep water aerobicsand walking. She reports she is sleeping well.   Review of Systems  Constitutional: Negative.   HENT: Positive for tinnitus.   Eyes: Negative.   Respiratory: Negative.   Cardiovascular: Negative.   Gastrointestinal: Negative.   Endocrine: Negative.   Genitourinary: Negative.   Musculoskeletal: Negative.   Skin: Negative.   Allergic/Immunologic: Positive for food allergies.  Neurological: Negative.   Hematological: Negative.   Psychiatric/Behavioral: Negative.     Social History   Socioeconomic History  . Marital status: Married    Spouse name: Not on file  . Number of children: Not on file  . Years of education: Not on file  . Highest education level: Not on file  Occupational History  . Not on file  Social Needs  . Financial resource strain: Not on file  . Food insecurity:    Worry: Not on file    Inability: Not on file  . Transportation needs:    Medical: Not on file    Non-medical: Not on file  Tobacco Use  . Smoking status: Former Smoker    Years: 10.00    Last attempt to quit: 1998    Years since quitting: 22.0  . Smokeless tobacco: Never Used  . Tobacco comment: Quit about 30 years ago  Substance and Sexual Activity  . Alcohol use: Yes    Comment: occasionally, 1x/month  . Drug use: No  . Sexual activity: Never  Lifestyle  . Physical activity:    Days per week: Not on file    Minutes per session: Not on file  . Stress: Not on file  Relationships  . Social connections:    Talks on phone: Not on file    Gets together:  Not on file    Attends religious service: Not on file    Active member of club or organization: Not on file    Attends meetings of clubs or organizations: Not on file    Relationship status: Not on file  . Intimate partner violence:    Fear of current or ex partner: Not on file    Emotionally abused: Not on file    Physically abused: Not on file    Forced sexual activity: Not on file  Other Topics Concern  . Not on file  Social History Narrative  . Not on file    Past Medical History:  Diagnosis Date  . Hearing loss   . Hernia, abdominal    showed up end of April 2018  . Seasonal allergies      Patient Active Problem List   Diagnosis Date Noted  . Essential (primary) hypertension 09/03/2008  . Headache, migraine 09/20/2007  . Allergic rhinitis, seasonal 01/25/2001  . History of colon polyps 07/18/2000  . Deficiency anemia 06/15/2000  . Combined fat and carbohydrate induced hyperlipemia 08/04/1999  . Acid reflux 07/03/1997  . Adiposity 07/13/1995    Past Surgical History:  Procedure Laterality Date  . ARTHRODESIS METATARSALPHALANGEAL JOINT (MTPJ) Left 06/03/2016   Procedure: ARTHRODESIS METATARSALPHALANGEAL JOINT (MTPJ);  Surgeon: Albertine Patricia, DPM;  Location: [place];  Service: Podiatry;  Laterality: Left;  . CESAREAN SECTION    . COLONOSCOPY    . EYE SURGERY Bilateral    X 2  . HAMMER TOE SURGERY Left 06/03/2016   Procedure: HAMMER TOE CORRECTION - 2nd left toe;  Surgeon: Albertine Patricia, DPM;  Location: [place];  Service: Podiatry;  Laterality: Left;  . TUBAL LIGATION    . WEIL OSTEOTOMY Left 06/03/2016   Procedure: WEIL OSTEOTOMY  SECOND TOE;  Surgeon: Albertine Patricia, DPM;  Location: [place];  Service: Podiatry;  Laterality: Left;    Her family history includes Breast cancer (age of onset: 38) in her sister; Dementia in her father and mother; Heart disease in her father; Hypertension in her father; Lung cancer in her  father.      Current Outpatient Medications:  .  Cetirizine HCl (ZYRTEC ALLERGY PO), Take by mouth daily., Disp: , Rfl:    Patient Care Team: Jerrol Banana., MD as PCP - General (Family Medicine)      Objective:   Vitals: BP 140/84 (BP Location: Left Arm, Patient Position: Sitting, Cuff Size: Normal)   Pulse 62   Temp 97.8 F (36.6 C) (Oral)   Wt 156 lb (70.8 kg)   SpO2 100%   BMI 28.53 kg/m   Physical Exam Constitutional:      Appearance: She is well-developed.  HENT:     Head: Normocephalic and atraumatic.     Right Ear: External ear normal.     Left Ear: External ear normal.     Nose: Nose normal.  Eyes:     Conjunctiva/sclera: Conjunctivae normal.     Pupils: Pupils are equal, round, and reactive to light.  Neck:     Musculoskeletal: Normal range of motion and neck supple.  Cardiovascular:     Rate and Rhythm: Normal rate and regular rhythm.     Heart sounds: Normal heart sounds.  Pulmonary:     Effort: Pulmonary effort is normal.     Breath sounds: Normal breath sounds.  Abdominal:     General: Bowel sounds are normal.     Palpations: Abdomen is soft.  Musculoskeletal: Normal range of motion.  Skin:    General: Skin is warm and dry.  Neurological:     Mental Status: She is alert and oriented to person, place, and time.     Deep Tendon Reflexes: Reflexes are normal and symmetric.  Psychiatric:        Behavior: Behavior normal.        Thought Content: Thought content normal.        Judgment: Judgment normal.      No exam data present  Activities of Daily Living In your present state of health, do you have any difficulty performing the following activities: 02/07/2018  Hearing? N  Vision? N  Difficulty concentrating or making decisions? N  Walking or climbing stairs? N  Dressing or bathing? N  Doing errands, shopping? N  Some recent data might be hidden    Fall Risk Assessment Fall Risk  02/07/2018 02/03/2016  Falls in the past year? 0  Yes  Number falls in past yr: 0 1  Injury with Fall? 0 Yes  Comment - slight cut on chin     Depression Screen PHQ 2/9 Scores 02/07/2018 02/03/2017 02/03/2016  PHQ - 2 Score 0 0 0  PHQ- 9 Score 0 0 -    No flowsheet data found.    Assessment &  Plan:     Initial Preventative Physical Exam  Reviewed patient's Family Medical History Reviewed and updated list of patient's medical providers Assessment of cognitive impairment was done Assessed patient's functional ability Established a written schedule for health screening [place] Completed and Reviewed  Exercise Activities and Dietary recommendations Goals   None     Immunization History  Administered Date(s) Administered  . Td 05/27/2003  . Tdap 02/03/2017    Health Maintenance  Topic Date Due  . HIV Screening  09/25/1967  . INFLUENZA VACCINE  08/18/2017  . PNA vac Low Risk Adult (1 of 2 - PCV13) 09/24/2017  . PAP SMEAR-Modifier  02/03/2019  . MAMMOGRAM  03/05/2019  . COLONOSCOPY  12/26/2019  . TETANUS/TDAP  02/04/2027  . DEXA SCAN  Completed  . Hepatitis C Screening  Completed     Discussed health benefits of physical activity, and encouraged her to engage in regular exercise appropriate for her age and condition.  GERD   ------------------------------------------------------------------------------------------------------------   I have done the exam and reviewed the above chart and it is accurate to the best of my knowledge. Development worker, community has been used in this note in any air is in the dictation or transcription are unintentional.  Wilhemena Durie, MD  [facility]

## 2018-03-07 ENCOUNTER — Ambulatory Visit
Admission: RE | Admit: 2018-03-07 | Discharge: 2018-03-07 | Disposition: A | Discharge status: Home / Self Care | Payer: Medicare Other | Source: Ambulatory Visit | Attending: Family Medicine | Admitting: Family Medicine

## 2018-03-07 DIAGNOSIS — Z1231 Encounter for screening mammogram for malignant neoplasm of breast: Secondary | ICD-10-CM | POA: Insufficient documentation

## 2018-05-04 DIAGNOSIS — R21 Rash and other nonspecific skin eruption: Secondary | ICD-10-CM | POA: Diagnosis not present

## 2018-05-04 DIAGNOSIS — L509 Urticaria, unspecified: Secondary | ICD-10-CM | POA: Diagnosis not present

## 2018-05-04 DIAGNOSIS — L299 Pruritus, unspecified: Secondary | ICD-10-CM | POA: Diagnosis not present

## 2018-05-04 DIAGNOSIS — L503 Dermatographic urticaria: Secondary | ICD-10-CM | POA: Diagnosis not present

## 2018-05-04 DIAGNOSIS — L309 Dermatitis, unspecified: Secondary | ICD-10-CM | POA: Diagnosis not present

## 2018-06-07 ENCOUNTER — Ambulatory Visit: Payer: Self-pay | Admitting: Family Medicine

## 2018-07-19 ENCOUNTER — Encounter: Payer: Self-pay | Admitting: Family Medicine

## 2018-07-19 ENCOUNTER — Other Ambulatory Visit: Payer: Self-pay

## 2018-07-19 ENCOUNTER — Ambulatory Visit (INDEPENDENT_AMBULATORY_CARE_PROVIDER_SITE_OTHER): Payer: Medicare Other | Admitting: Family Medicine

## 2018-07-19 VITALS — BP 130/72 | HR 63 | Temp 98.0°F | Resp 16 | Wt 158.0 lb

## 2018-07-19 DIAGNOSIS — G43909 Migraine, unspecified, not intractable, without status migrainosus: Secondary | ICD-10-CM

## 2018-07-19 DIAGNOSIS — E785 Hyperlipidemia, unspecified: Secondary | ICD-10-CM

## 2018-07-19 DIAGNOSIS — J302 Other seasonal allergic rhinitis: Secondary | ICD-10-CM

## 2018-07-19 DIAGNOSIS — D539 Nutritional anemia, unspecified: Secondary | ICD-10-CM

## 2018-07-19 DIAGNOSIS — I10 Essential (primary) hypertension: Secondary | ICD-10-CM | POA: Diagnosis not present

## 2018-07-19 DIAGNOSIS — E1169 Type 2 diabetes mellitus with other specified complication: Secondary | ICD-10-CM | POA: Diagnosis not present

## 2018-07-19 NOTE — Progress Notes (Signed)
Patient: Elizabeth Morgan Female    DOB: January 11, 1951   68 y.o.   MRN: [id number] Visit Date: 07/19/2018  Today's Provider: Wilhemena Durie, MD   Chief Complaint  Patient presents with  . Follow-up   Subjective:     HPI  4 Month Follow Up. She has no complaints. She feels well.  Allergies  Allergen Reactions  . Aleve [Naproxen Sodium] Other (See Comments)    BURN STOMACH  . Aspirin Other (See Comments)    BURN STOMACH  . Chicken Protein     Dizziness  . Codeine Nausea And Vomiting     Current Outpatient Medications:  .  Cetirizine HCl (ZYRTEC ALLERGY PO), Take by mouth daily., Disp: , Rfl:  .  fexofenadine (ALLEGRA) 180 MG tablet, Take 180 mg by mouth daily., Disp: , Rfl:   Review of Systems  Constitutional: Negative.   HENT: Negative.   Eyes: Negative.   Respiratory: Negative.   Cardiovascular: Negative.   Gastrointestinal: Negative.   Endocrine: Negative.   Genitourinary: Negative.   Musculoskeletal: Negative.   Skin: Negative.   Allergic/Immunologic: Negative.   Neurological: Negative.   Hematological: Negative.   Psychiatric/Behavioral: Negative.   All other systems reviewed and are negative.   Social History   Tobacco Use  . Smoking status: Former Smoker    Years: 10.00    Quit date: 1998    Years since quitting: 22.5  . Smokeless tobacco: Never Used  . Tobacco comment: Quit about 30 years ago  Substance Use Topics  . Alcohol use: Yes    Comment: occasionally, 1x/month      Objective:   BP 130/72 (BP Location: Left Arm, Patient Position: Sitting, Cuff Size: Normal)   Pulse 63   Temp 98 F (36.7 C) (Oral)   Resp 16   Wt 158 lb (71.7 kg)   SpO2 97%   BMI 28.90 kg/m  Vitals:   07/19/18 0813  BP: 130/72  Pulse: 63  Resp: 16  Temp: 98 F (36.7 C)  TempSrc: Oral  SpO2: 97%  Weight: 158 lb (71.7 kg)     Physical Exam Vitals signs reviewed.  Constitutional:      Appearance: She is well-developed.  HENT:     Head:  Normocephalic and atraumatic.     Right Ear: External ear normal.     Left Ear: External ear normal.     Nose: Nose normal.  Eyes:     Conjunctiva/sclera: Conjunctivae normal.     Pupils: Pupils are equal, round, and reactive to light.  Neck:     Musculoskeletal: Normal range of motion and neck supple.  Cardiovascular:     Rate and Rhythm: Normal rate and regular rhythm.     Heart sounds: Normal heart sounds.  Pulmonary:     Effort: Pulmonary effort is normal.     Breath sounds: Normal breath sounds.  Abdominal:     General: Bowel sounds are normal.     Palpations: Abdomen is soft.  Musculoskeletal: Normal range of motion.  Skin:    General: Skin is warm and dry.  Neurological:     Mental Status: She is alert and oriented to person, place, and time.     Deep Tendon Reflexes: Reflexes are normal and symmetric.  Psychiatric:        Behavior: Behavior normal.        Thought Content: Thought content normal.        Judgment: Judgment normal.  No results found for any visits on 07/19/18.     Assessment & Plan    1. Deficiency anemia  - CBC w/Diff/Platelet  2. Hyperlipidemia associated with type 2 diabetes mellitus ([place])  - Comp. Metabolic Panel (12) - Lipid Profile  3. Seasonal allergies   4. Migraine without status migrainosus, not intractable, unspecified migraine type   5. Hypertension, unspecified type  - TSH      Cranford Mon, MD  [facility]

## 2018-07-20 ENCOUNTER — Telehealth: Payer: Self-pay

## 2018-07-20 LAB — COMP. METABOLIC PANEL (12)
AST: 23 IU/L (ref 0–40)
Albumin/Globulin Ratio: 2.1 (ref 1.2–2.2)
Albumin: 4.8 g/dL (ref 3.8–4.8)
Alkaline Phosphatase: 81 IU/L (ref 39–117)
BUN/Creatinine Ratio: 25 (ref 12–28)
BUN: 18 mg/dL (ref 8–27)
Bilirubin Total: 0.4 mg/dL (ref 0.0–1.2)
Calcium: 9.9 mg/dL (ref 8.7–10.3)
Chloride: 100 mmol/L (ref 96–106)
Creatinine, Ser: 0.71 mg/dL (ref 0.57–1.00)
GFR calc Af Amer: 103 mL/min/{1.73_m2} (ref >59)
GFR calc non Af Amer: 90 mL/min/{1.73_m2} (ref >59)
Globulin, Total: 2.3 g/dL (ref 1.5–4.5)
Glucose: 86 mg/dL (ref 65–99)
Potassium: 4.4 mmol/L (ref 3.5–5.2)
Sodium: 136 mmol/L (ref 134–144)
Total Protein: 7.1 g/dL (ref 6.0–8.5)

## 2018-07-20 LAB — CBC WITH DIFFERENTIAL/PLATELET
Basophils Absolute: 0 10*3/uL (ref 0.0–0.2)
Basos: 1 %
EOS (ABSOLUTE): 0.2 10*3/uL (ref 0.0–0.4)
Eos: 3 %
Hematocrit: 40.9 % (ref 34.0–46.6)
Hemoglobin: 13.8 g/dL (ref 11.1–15.9)
Immature Grans (Abs): 0 10*3/uL (ref 0.0–0.1)
Immature Granulocytes: 0 %
Lymphocytes Absolute: 2 10*3/uL (ref 0.7–3.1)
Lymphs: 30 %
MCH: 30.4 pg (ref 26.6–33.0)
MCHC: 33.7 g/dL (ref 31.5–35.7)
MCV: 90 fL (ref 79–97)
Monocytes Absolute: 0.6 10*3/uL (ref 0.1–0.9)
Monocytes: 10 %
Neutrophils Absolute: 3.8 10*3/uL (ref 1.4–7.0)
Neutrophils: 56 %
Platelets: 242 10*3/uL (ref 150–450)
RBC: 4.54 x10E6/uL (ref 3.77–5.28)
RDW: 12 % (ref 11.7–15.4)
WBC: 6.7 10*3/uL (ref 3.4–10.8)

## 2018-07-20 LAB — TSH: TSH: 1.24 u[IU]/mL (ref 0.450–4.500)

## 2018-07-20 LAB — LIPID PANEL
Chol/HDL Ratio: 3.3 ratio (ref 0.0–4.4)
Cholesterol, Total: 246 mg/dL — ABNORMAL HIGH (ref 100–199)
HDL: 74 mg/dL (ref >39)
LDL Calculated: 148 mg/dL — ABNORMAL HIGH (ref 0–99)
Triglycerides: 121 mg/dL (ref 0–149)
VLDL Cholesterol Cal: 24 mg/dL (ref 5–40)

## 2018-07-20 NOTE — Telephone Encounter (Signed)
Patient notified about lab results.

## 2018-07-20 NOTE — Telephone Encounter (Signed)
-----   Message from Jerrol Banana., MD sent at 07/20/2018  3:01 PM EDT ----- Labs OK

## 2018-11-29 DIAGNOSIS — L570 Actinic keratosis: Secondary | ICD-10-CM | POA: Diagnosis not present

## 2018-11-29 DIAGNOSIS — Z1283 Encounter for screening for malignant neoplasm of skin: Secondary | ICD-10-CM | POA: Diagnosis not present

## 2018-11-29 DIAGNOSIS — D2230 Melanocytic nevi of unspecified part of face: Secondary | ICD-10-CM | POA: Diagnosis not present

## 2018-11-29 DIAGNOSIS — L814 Other melanin hyperpigmentation: Secondary | ICD-10-CM | POA: Diagnosis not present

## 2018-11-29 DIAGNOSIS — D1800 Hemangioma unspecified site: Secondary | ICD-10-CM | POA: Diagnosis not present

## 2018-11-29 DIAGNOSIS — D225 Melanocytic nevi of trunk: Secondary | ICD-10-CM | POA: Diagnosis not present

## 2019-01-31 DIAGNOSIS — L814 Other melanin hyperpigmentation: Secondary | ICD-10-CM | POA: Diagnosis not present

## 2019-01-31 DIAGNOSIS — L570 Actinic keratosis: Secondary | ICD-10-CM | POA: Diagnosis not present

## 2019-02-07 ENCOUNTER — Telehealth: Payer: Self-pay

## 2019-02-07 NOTE — Telephone Encounter (Signed)
Copied from [place] [id number]. Topic: Appointment Scheduling - Scheduling Inquiry for Clinic >> Feb 07, 2019 12:59 PM Virl Axe D wrote: Reason for CRM: Pt called to reschedule CPE and AWV originally scheduled on 02/14/19. Unable to reschedule due to error that states pt has to be scheduled for initial AWV. Epic would not allow agent to schedule CPE at all. Please reach out to pt to reschedule

## 2019-02-07 NOTE — Telephone Encounter (Signed)
Patient scheduled for appt.

## 2019-02-14 ENCOUNTER — Ambulatory Visit: Payer: Medicare Other

## 2019-02-14 ENCOUNTER — Encounter: Payer: Medicare Other | Admitting: Family Medicine

## 2019-02-27 NOTE — Progress Notes (Signed)
Subjective:   Elizabeth Morgan is a 69 y.o. female who presents for an Initial Medicare Annual Wellness Visit.    This visit is being conducted through telemedicine due to the COVID-19 pandemic. This patient has given me verbal consent via doximity to conduct this visit, patient states they are participating from their home address. Some vital signs may be absent or patient reported.    Patient identification: identified by name, DOB, and current address  Review of Systems    N/A   Cardiac Risk Factors include: advanced age (>45men, >66 women)     Objective:    Today's Vitals   02/28/19 0955  PainSc: 0-No pain   There is no height or weight on file to calculate BMI. Unable to obtain vitals due to visit being conducted via telephonically.   Advanced Directives 02/28/2019 06/03/2016 02/03/2016  Does Patient Have a Medical Advance Directive? No No No  Does patient want to make changes to medical advance directive? - No - Patient declined -  Would patient like information on creating a medical advance directive? No - Patient declined - -    Current Medications (verified) Outpatient Encounter Medications as of 02/28/2019  Medication Sig  . Cetirizine HCl (ZYRTEC ALLERGY PO) Take by mouth daily.  . fexofenadine (ALLEGRA) 180 MG tablet Take 180 mg by mouth daily.   No facility-administered encounter medications on file as of 02/28/2019.    Allergies (verified) Aleve [naproxen sodium], Aspirin, Chicken protein, and Codeine   History: Past Medical History:  Diagnosis Date  . Hearing loss   . Hernia, abdominal    showed up end of April 2018  . History of herpes zoster 11/05/2015  . Seasonal allergies    Past Surgical History:  Procedure Laterality Date  . ARTHRODESIS METATARSALPHALANGEAL JOINT (MTPJ) Left 06/03/2016   Procedure: ARTHRODESIS METATARSALPHALANGEAL JOINT (MTPJ);  Surgeon: Albertine Patricia, DPM;  Location: [place];  Service: Podiatry;  Laterality:  Left;  . CESAREAN SECTION    . COLONOSCOPY    . EYE SURGERY Bilateral    X 2  . HAMMER TOE SURGERY Left 06/03/2016   Procedure: HAMMER TOE CORRECTION - 2nd left toe;  Surgeon: Albertine Patricia, DPM;  Location: [place];  Service: Podiatry;  Laterality: Left;  . TUBAL LIGATION    . WEIL OSTEOTOMY Left 06/03/2016   Procedure: WEIL OSTEOTOMY  SECOND TOE;  Surgeon: Albertine Patricia, DPM;  Location: [place];  Service: Podiatry;  Laterality: Left;   Family History  Problem Relation Age of Onset  . Dementia Mother   . Dementia Father   . Hypertension Father   . Heart disease Father   . Lung cancer Father   . Breast cancer Sister 48   Social History   Socioeconomic History  . Marital status: Married    Spouse name: Not on file  . Number of children: 1  . Years of education: Not on file  . Highest education level: Bachelor's degree (e.g., BA, AB, BS)  Occupational History  . Occupation: retired  Tobacco Use  . Smoking status: Former Smoker    Years: 10.00    Quit date: 1998    Years since quitting: 23.1  . Smokeless tobacco: Never Used  . Tobacco comment: Quit about 30 years ago  Substance and Sexual Activity  . Alcohol use: Yes    Comment: occasionally, 1x/month  . Drug use: No  . Sexual activity: Never  Other Topics Concern  . Not on file  Social  History Narrative  . Not on file   Social Determinants of Health   Financial Resource Strain: Low Risk   . Difficulty of Paying Living Expenses: Not hard at all  Food Insecurity: No Food Insecurity  . Worried About Charity fundraiser in the Last Year: Never true  . Ran Out of Food in the Last Year: Never true  Transportation Needs: No Transportation Needs  . Lack of Transportation (Medical): No  . Lack of Transportation (Non-Medical): No  Physical Activity: Inactive  . Days of Exercise per Week: 0 days  . Minutes of Exercise per Session: 0 min  Stress: No Stress Concern Present  . Feeling of Stress  : Not at all  Social Connections: Slightly Isolated  . Frequency of Communication with Friends and Family: More than three times a week  . Frequency of Social Gatherings with Friends and Family: Never  . Attends Religious Services: More than 4 times per year  . Active Member of Clubs or Organizations: No  . Attends Archivist Meetings: Never  . Marital Status: Married    Tobacco Counseling Counseling given: Not Answered Comment: Quit about 30 years ago   Clinical Intake:  Pre-visit preparation completed: Yes  Pain : No/denies pain Pain Score: 0-No pain     Nutritional Risks: None Diabetes: No  How often do you need to have someone help you when you read instructions, pamphlets, or other written materials from your doctor or pharmacy?: 1 - Never  Interpreter Needed?: No  Information entered by :: [facility], LPN   Activities of Daily Living In your present state of health, do you have any difficulty performing the following activities: 02/28/2019  Hearing? N  Vision? N  Difficulty concentrating or making decisions? N  Walking or climbing stairs? N  Dressing or bathing? N  Doing errands, shopping? N  Preparing Food and eating ? N  Using the Toilet? N  In the past six months, have you accidently leaked urine? N  Do you have problems with loss of bowel control? N  Managing your Medications? N  Managing your Finances? N  Housekeeping or managing your Housekeeping? N  Some recent data might be hidden     Immunizations and Health Maintenance Immunization History  Administered Date(s) Administered  . Td 05/27/2003  . Tdap 02/03/2017   Health Maintenance Due  Topic Date Due  . URINE MICROALBUMIN  09/25/1962    Patient Care Team: Jerrol Banana., MD as PCP - General (Family Medicine)  Indicate any recent Medical Services you may have received from other than [facility] providers in the past year (date may be approximate).     Assessment:   This is  a routine wellness examination for Elizabeth Morgan.  Hearing/Vision screen No exam data present  Dietary issues and exercise activities discussed: Current Exercise Habits: The patient does not participate in regular exercise at present(Walks dog daily.), Exercise limited by: None identified  Goals    . DIET - INCREASE WATER INTAKE     Recommend to drink at least 6-8 8oz glasses of water per day.      Depression Screen PHQ 2/9 Scores 02/28/2019 02/07/2018 02/03/2017 02/03/2016  PHQ - 2 Score 0 0 0 0  PHQ- 9 Score - 0 0 -    Fall Risk Fall Risk  02/28/2019 02/07/2018 02/03/2016  Falls in the past year? 0 0 Yes  Number falls in past yr: 0 0 1  Injury with Fall? 0 0 Yes  Comment - -  slight cut on chin   FALL RISK PREVENTION PERTAINING TO THE HOME:  Any stairs in or around the home? Yes  If so, are there any without handrails? No   Home free of loose throw rugs in walkways, pet beds, electrical cords, etc? Yes  Adequate lighting in your home to reduce risk of falls? Yes   ASSISTIVE DEVICES UTILIZED TO PREVENT FALLS:  Life alert? No  Use of a cane, walker or w/c? No  Grab bars in the bathroom? No  Shower chair or bench in shower? No  Elevated toilet seat or a handicapped toilet? No    TIMED UP AND GO:  Was the test performed? No .     Cognitive Function: Declined today.          Screening Tests Health Maintenance  Topic Date Due  . URINE MICROALBUMIN  09/25/1962  . INFLUENZA VACCINE  04/18/2019 (Originally 08/19/2018)  . PNA vac Low Risk Adult (1 of 2 - PCV13) 02/28/2020 (Originally 09/24/2017)  . COLONOSCOPY  12/26/2019  . MAMMOGRAM  03/07/2020  . TETANUS/TDAP  02/04/2027  . DEXA SCAN  Completed  . Hepatitis C Screening  Completed    Qualifies for Shingles Vaccine? Yes . Due for Shingrix. Pt has been advised to call insurance company to determine out of pocket expense. Advised may also receive vaccine at local pharmacy or Health Dept. Verbalized acceptance and  understanding.  Tdap: Up to date  Flu Vaccine: Due for Flu vaccine. Does the patient want to receive this vaccine today?  No . Advised may receive this vaccine at local pharmacy or Health Dept. Aware to provide a copy of the vaccination record if obtained from local pharmacy or Health Dept. Verbalized acceptance and understanding.  Pneumococcal Vaccine: Due for Pneumococcal vaccine. Does the patient want to receive this vaccine today?  No . Advised may receive this vaccine at local pharmacy or Health Dept. Aware to provide a copy of the vaccination record if obtained from local pharmacy or Health Dept. Verbalized acceptance and understanding.   Cancer Screenings:  Colorectal Screening: Completed 12/26/14. Repeat every 5 years.   Mammogram: Completed 03/07/18. Repeat every year.   Bone Density: Completed 04/21/11. Previous DEXA scan was normal. No repeat needed unless advised by a physician.   Lung Cancer Screening: (Low Dose CT Chest recommended if Age 32-80 years, 30 pack-year currently smoking OR have quit w/in 15years.) does not qualify.   Additional Screening:  Hepatitis C Screening: Up to date  Vision Screening: Recommended annual ophthalmology exams for early detection of glaucoma and other disorders of the eye.  Dental Screening: Recommended annual dental exams for proper oral hygiene  Community Resource Referral:  CRR required this visit?  No       Plan:  I have personally reviewed and addressed the Medicare Annual Wellness questionnaire and have noted the following in the patient's chart:  A. Medical and social history B. Use of alcohol, tobacco or illicit drugs  C. Current medications and supplements D. Functional ability and status E.  Nutritional status F.  Physical activity G. Advance directives H. List of other physicians I.  Hospitalizations, surgeries, and ER visits in previous 12 months J.  [place] such as hearing and vision if needed, cognitive  and depression L. Referrals and appointments   In addition, I have reviewed and discussed with patient certain preventive protocols, quality metrics, and best practice recommendations. A written personalized care plan for preventive services as well as general preventive health  recommendations were provided to patient.   Maritta, [place], [place]   [id number]  Nurse Health Advisor   Nurse Notes: Pt needs a urine check at next in office apt. Declined a future flu or pneumonia vaccine.

## 2019-02-28 ENCOUNTER — Other Ambulatory Visit: Payer: Self-pay

## 2019-02-28 ENCOUNTER — Ambulatory Visit (INDEPENDENT_AMBULATORY_CARE_PROVIDER_SITE_OTHER): Payer: Medicare Other

## 2019-02-28 DIAGNOSIS — Z0000 Encounter for general adult medical examination without abnormal findings: Secondary | ICD-10-CM

## 2019-02-28 NOTE — Patient Instructions (Signed)
Elizabeth Morgan , Thank you for taking time to come for your Medicare Wellness Visit. I appreciate your ongoing commitment to your health goals. Please review the following plan we discussed and let me know if I can assist you in the future.   Screening recommendations/referrals: Colonoscopy: Up to date, due 12/2019 Mammogram: Up to date, due 02/2020 Bone Density: Up to date. Previous DEXA scan was normal. No repeat needed unless advised by a physician. Recommended yearly ophthalmology/optometry visit for glaucoma screening and checkup Recommended yearly dental visit for hygiene and checkup  Vaccinations: Influenza vaccine: Pt declines today.  Pneumococcal vaccine: Pt declines today.  Tdap vaccine: Up to date, due 01/2027 Shingles vaccine: Pt declines today.     Advanced directives: Advance directive discussed with you today. Even though you declined this today please call our office should you change your mind and we can give you the proper paperwork for you to fill out.  Conditions/risks identified: Recommend to drink at least 6-8 8oz glasses of water per day.  Next appointment: 03/28/19 @ 9:00 AM with Dr Rosanna Randy    Preventive Care 38 Years and Older, Female Preventive care refers to lifestyle choices and visits with your health care provider that can promote health and wellness. What does preventive care include?  A yearly physical exam. This is also called an annual well check.  Dental exams once or twice a year.  Routine eye exams. Ask your health care provider how often you should have your eyes checked.  Personal lifestyle choices, including:  Daily care of your teeth and gums.  Regular physical activity.  Eating a healthy diet.  Avoiding tobacco and drug use.  Limiting alcohol use.  Practicing safe sex.  Taking low-dose aspirin every day.  Taking vitamin and mineral supplements as recommended by your health care provider. What happens during an annual well  check? The services and screenings done by your health care provider during your annual well check will depend on your age, overall health, lifestyle risk factors, and family history of disease. Counseling  Your health care provider may ask you questions about your:  Alcohol use.  Tobacco use.  Drug use.  Emotional well-being.  Home and relationship well-being.  Sexual activity.  Eating habits.  History of falls.  Memory and ability to understand (cognition).  Work and work Statistician.  Reproductive health. Screening  You may have the following tests or measurements:  Height, weight, and BMI.  Blood pressure.  Lipid and cholesterol levels. These may be checked every 5 years, or more frequently if you are over 35 years old.  Skin check.  Lung cancer screening. You may have this screening every year starting at age 22 if you have a 30-pack-year history of smoking and currently smoke or have quit within the past 15 years.  Fecal occult blood test (FOBT) of the stool. You may have this test every year starting at age 52.  Flexible sigmoidoscopy or colonoscopy. You may have a sigmoidoscopy every 5 years or a colonoscopy every 10 years starting at age 25.  Hepatitis C blood test.  Hepatitis B blood test.  Sexually transmitted disease (STD) testing.  Diabetes screening. This is done by checking your blood sugar (glucose) after you have not eaten for a while (fasting). You may have this done every 1-3 years.  Bone density scan. This is done to screen for osteoporosis. You may have this done starting at age 20.  Mammogram. This may be done every 1-2 years. Talk to  your health care provider about how often you should have regular mammograms. Talk with your health care provider about your test results, treatment options, and if necessary, the need for more tests. Vaccines  Your health care provider may recommend certain vaccines, such as:  Influenza vaccine. This is  recommended every year.  Tetanus, diphtheria, and acellular pertussis (Tdap, Td) vaccine. You may need a Td booster every 10 years.  Zoster vaccine. You may need this after age 62.  Pneumococcal 13-valent conjugate (PCV13) vaccine. One dose is recommended after age 72.  Pneumococcal polysaccharide (PPSV23) vaccine. One dose is recommended after age 74. Talk to your health care provider about which screenings and vaccines you need and how often you need them. This information is not intended to replace advice given to you by your health care provider. Make sure you discuss any questions you have with your health care provider. Document Released: 01/31/2015 Document Revised: 09/24/2015 Document Reviewed: 11/05/2014 Elsevier Interactive Patient Education  2017 [place] Prevention in the Home Falls can cause injuries. They can happen to people of all ages. There are many things you can do to make your home safe and to help prevent falls. What can I do on the outside of my home?  Regularly fix the edges of walkways and driveways and fix any cracks.  Remove anything that might make you trip as you walk through a door, such as a raised step or threshold.  Trim any bushes or trees on the path to your home.  Use bright outdoor lighting.  Clear any walking paths of anything that might make someone trip, such as rocks or tools.  Regularly check to see if handrails are loose or broken. Make sure that both sides of any steps have handrails.  Any raised decks and porches should have guardrails on the edges.  Have any leaves, snow, or ice cleared regularly.  Use sand or salt on walking paths during winter.  Clean up any spills in your garage right away. This includes oil or grease spills. What can I do in the bathroom?  Use night lights.  Install grab bars by the toilet and in the tub and shower. Do not use towel bars as grab bars.  Use non-skid mats or decals in the tub or  shower.  If you need to sit down in the shower, use a plastic, non-slip stool.  Keep the floor dry. Clean up any water that spills on the floor as soon as it happens.  Remove soap buildup in the tub or shower regularly.  Attach bath mats securely with double-sided non-slip rug tape.  Do not have throw rugs and other things on the floor that can make you trip. What can I do in the bedroom?  Use night lights.  Make sure that you have a light by your bed that is easy to reach.  Do not use any sheets or blankets that are too big for your bed. They should not hang down onto the floor.  Have a firm chair that has side arms. You can use this for support while you get dressed.  Do not have throw rugs and other things on the floor that can make you trip. What can I do in the kitchen?  Clean up any spills right away.  Avoid walking on wet floors.  Keep items that you use a lot in easy-to-reach places.  If you need to reach something above you, use a strong step stool that has  a grab bar.  Keep electrical cords out of the way.  Do not use floor polish or wax that makes floors slippery. If you must use wax, use non-skid floor wax.  Do not have throw rugs and other things on the floor that can make you trip. What can I do with my stairs?  Do not leave any items on the stairs.  Make sure that there are handrails on both sides of the stairs and use them. Fix handrails that are broken or loose. Make sure that handrails are as long as the stairways.  Check any carpeting to make sure that it is firmly attached to the stairs. Fix any carpet that is loose or worn.  Avoid having throw rugs at the top or bottom of the stairs. If you do have throw rugs, attach them to the floor with carpet tape.  Make sure that you have a light switch at the top of the stairs and the bottom of the stairs. If you do not have them, ask someone to add them for you. What else can I do to help prevent  falls?  Wear shoes that:  Do not have high heels.  Have rubber bottoms.  Are comfortable and fit you well.  Are closed at the toe. Do not wear sandals.  If you use a stepladder:  Make sure that it is fully opened. Do not climb a closed stepladder.  Make sure that both sides of the stepladder are locked into place.  Ask someone to hold it for you, if possible.  Clearly mark and make sure that you can see:  Any grab bars or handrails.  First and last steps.  Where the edge of each step is.  Use tools that help you move around (mobility aids) if they are needed. These include:  Canes.  Walkers.  Scooters.  Crutches.  Turn on the lights when you go into a dark area. Replace any light bulbs as soon as they burn out.  Set up your furniture so you have a clear path. Avoid moving your furniture around.  If any of your floors are uneven, fix them.  If there are any pets around you, be aware of where they are.  Review your medicines with your doctor. Some medicines can make you feel dizzy. This can increase your chance of falling. Ask your doctor what other things that you can do to help prevent falls. This information is not intended to replace advice given to you by your health care provider. Make sure you discuss any questions you have with your health care provider. Document Released: 10/31/2008 Document Revised: 06/12/2015 Document Reviewed: 02/08/2014 Elsevier Interactive Patient Education  2017 [employer].

## 2019-03-08 ENCOUNTER — Ambulatory Visit: Payer: Medicare Other

## 2019-03-27 NOTE — Progress Notes (Signed)
Patient: Elizabeth Morgan Female    DOB: [birth year]   69 y.o.   MRN: [id number] Visit Date: 03/28/2019  Today's Provider: Wilhemena Durie, MD   Chief Complaint  Patient presents with  . Annual Exam   Subjective:    Patient had AWV with [facility] 02/28/2019.  HPI  Patient says that she is feeling well. Patient is exercising. Patient says she is sleeping well.  Patient is done well but her 57 year old mother died in 02-03-23 of Covid.  She is coping well. Deficiency anemia From 07/19/2018-labs checked showing-okay.   Hyperlipidemia associated with type 2 diabetes mellitus ([place]) From 07/19/2018-labs checked showing-okay.   Hypertension, unspecified type From 07/19/2018-labs checked showing-okay.    Allergies  Allergen Reactions  . Aleve [Naproxen Sodium] Other (See Comments)    BURN STOMACH  . Aspirin Other (See Comments)    BURN STOMACH  . Chicken Protein     Dizziness  . Codeine Nausea And Vomiting     Current Outpatient Medications:  .  Cetirizine HCl (ZYRTEC ALLERGY PO), Take by mouth daily., Disp: , Rfl:  .  fexofenadine (ALLEGRA) 180 MG tablet, Take 180 mg by mouth daily., Disp: , Rfl:   Review of Systems  Constitutional: Negative.   HENT: Positive for tinnitus.   Eyes: Negative.   Respiratory: Negative.   Cardiovascular: Negative.   Gastrointestinal: Negative.   Endocrine: Negative.   Genitourinary: Negative.   Musculoskeletal: Negative.   Skin: Negative.   Allergic/Immunologic: Positive for food allergies.  Neurological: Positive for numbness.       Occasional tingling of the thumb and the first 2 fingers of the right hand.  Does not last long.  No known trauma.  Hematological: Negative.   Psychiatric/Behavioral: Negative.     Social History   Tobacco Use  . Smoking status: Former Smoker    Years: 10.00    Quit date: 1998    Years since quitting: 23.2  . Smokeless tobacco: Never Used  . Tobacco comment: Quit about 30 years ago  Substance  Use Topics  . Alcohol use: Yes    Comment: occasionally, 1x/month      Objective:   There were no vitals taken for this visit. There were no vitals filed for this visit.There is no height or weight on file to calculate BMI.   Physical Exam Vitals reviewed.  Constitutional:      Appearance: She is well-developed.  HENT:     Head: Normocephalic and atraumatic.     Right Ear: External ear normal.     Left Ear: External ear normal.     Nose: Nose normal.  Eyes:     Conjunctiva/sclera: Conjunctivae normal.     Pupils: Pupils are equal, round, and reactive to light.  Cardiovascular:     Rate and Rhythm: Normal rate and regular rhythm.     Heart sounds: Normal heart sounds.  Pulmonary:     Effort: Pulmonary effort is normal.     Breath sounds: Normal breath sounds.  Chest:     Breasts:        Right: Normal.        Left: Normal.  Abdominal:     General: Bowel sounds are normal.     Palpations: Abdomen is soft.  Musculoskeletal:        General: Normal range of motion.     Cervical back: Normal range of motion and neck supple.     Comments: Mild increase in thoracic kyphosis.  Skin:    General: Skin is warm and dry.  Neurological:     General: No focal deficit present.     Mental Status: She is alert and oriented to person, place, and time.     Deep Tendon Reflexes: Reflexes are normal and symmetric.     Comments: Negative Tinel's sign and negative Phalen  Psychiatric:        Mood and Affect: Mood normal.        Behavior: Behavior normal.        Thought Content: Thought content normal.        Judgment: Judgment normal.      No results found for any visits on 03/28/19.     Assessment & Plan    1. Essential (primary) hypertension Blood pressure is good. - TSH - Comprehensive metabolic panel - CBC with Differential/Platelet - Lipid panel  2. Seasonal allergic rhinitis due to fungal spores  - TSH - Comprehensive metabolic panel - CBC with  Differential/Platelet - Lipid panel  3. Deficiency anemia Follow-up CBC.  Work-up as necessary. - TSH - Comprehensive metabolic panel - CBC with Differential/Platelet - Lipid panel  4. Screening for osteoporosis Mild increase in thoracic kyphosis in this postmenopausal woman.  Plan BMD. - DEXAScan  5. Postmenopausal Also needs yearly mammogram his sister died in her early 7s with breast cancer. - DEXAScan      Wilhemena Durie, MD  [facility]

## 2019-03-28 ENCOUNTER — Encounter: Payer: Self-pay | Admitting: Family Medicine

## 2019-03-28 ENCOUNTER — Other Ambulatory Visit: Payer: Self-pay

## 2019-03-28 ENCOUNTER — Ambulatory Visit (INDEPENDENT_AMBULATORY_CARE_PROVIDER_SITE_OTHER): Payer: Medicare Other | Admitting: Family Medicine

## 2019-03-28 ENCOUNTER — Other Ambulatory Visit: Payer: Self-pay | Admitting: Family Medicine

## 2019-03-28 VITALS — BP 122/76 | HR 64 | Temp 96.8°F | Ht 63.0 in | Wt 162.0 lb

## 2019-03-28 DIAGNOSIS — I10 Essential (primary) hypertension: Secondary | ICD-10-CM | POA: Diagnosis not present

## 2019-03-28 DIAGNOSIS — Z780 Asymptomatic menopausal state: Secondary | ICD-10-CM | POA: Diagnosis not present

## 2019-03-28 DIAGNOSIS — Z0000 Encounter for general adult medical examination without abnormal findings: Secondary | ICD-10-CM | POA: Diagnosis not present

## 2019-03-28 DIAGNOSIS — J3089 Other allergic rhinitis: Secondary | ICD-10-CM | POA: Diagnosis not present

## 2019-03-28 DIAGNOSIS — Z13820 Encounter for screening for osteoporosis: Secondary | ICD-10-CM

## 2019-03-28 DIAGNOSIS — D539 Nutritional anemia, unspecified: Secondary | ICD-10-CM | POA: Diagnosis not present

## 2019-03-28 DIAGNOSIS — Z1231 Encounter for screening mammogram for malignant neoplasm of breast: Secondary | ICD-10-CM

## 2019-03-28 NOTE — Patient Instructions (Signed)
Try Calcium with Vitamin D and Vitamin B12 Daily!!!

## 2019-03-29 LAB — COMPREHENSIVE METABOLIC PANEL
ALT: 21 IU/L (ref 0–32)
AST: 24 IU/L (ref 0–40)
Albumin/Globulin Ratio: 2.1 (ref 1.2–2.2)
Albumin: 4.7 g/dL (ref 3.8–4.8)
Alkaline Phosphatase: 89 IU/L (ref 39–117)
BUN/Creatinine Ratio: 27 (ref 12–28)
BUN: 18 mg/dL (ref 8–27)
Bilirubin Total: 0.4 mg/dL (ref 0.0–1.2)
CO2: 24 mmol/L (ref 20–29)
Calcium: 9.8 mg/dL (ref 8.7–10.3)
Chloride: 100 mmol/L (ref 96–106)
Creatinine, Ser: 0.67 mg/dL (ref 0.57–1.00)
GFR calc Af Amer: 106 mL/min/{1.73_m2} (ref >59)
GFR calc non Af Amer: 92 mL/min/{1.73_m2} (ref >59)
Globulin, Total: 2.2 g/dL (ref 1.5–4.5)
Glucose: 84 mg/dL (ref 65–99)
Potassium: 4.6 mmol/L (ref 3.5–5.2)
Sodium: 138 mmol/L (ref 134–144)
Total Protein: 6.9 g/dL (ref 6.0–8.5)

## 2019-03-29 LAB — CBC WITH DIFFERENTIAL/PLATELET
Basophils Absolute: 0 10*3/uL (ref 0.0–0.2)
Basos: 1 %
EOS (ABSOLUTE): 0.2 10*3/uL (ref 0.0–0.4)
Eos: 2 %
Hematocrit: 42.2 % (ref 34.0–46.6)
Hemoglobin: 14.3 g/dL (ref 11.1–15.9)
Immature Grans (Abs): 0 10*3/uL (ref 0.0–0.1)
Immature Granulocytes: 0 %
Lymphocytes Absolute: 2.2 10*3/uL (ref 0.7–3.1)
Lymphs: 33 %
MCH: 30.2 pg (ref 26.6–33.0)
MCHC: 33.9 g/dL (ref 31.5–35.7)
MCV: 89 fL (ref 79–97)
Monocytes Absolute: 0.6 10*3/uL (ref 0.1–0.9)
Monocytes: 9 %
Neutrophils Absolute: 3.7 10*3/uL (ref 1.4–7.0)
Neutrophils: 55 %
Platelets: 253 10*3/uL (ref 150–450)
RBC: 4.73 x10E6/uL (ref 3.77–5.28)
RDW: 12.1 % (ref 11.7–15.4)
WBC: 6.7 10*3/uL (ref 3.4–10.8)

## 2019-03-29 LAB — LIPID PANEL
Chol/HDL Ratio: 3.4 ratio (ref 0.0–4.4)
Cholesterol, Total: 248 mg/dL — ABNORMAL HIGH (ref 100–199)
HDL: 72 mg/dL (ref >39)
LDL Chol Calc (NIH): 155 mg/dL — ABNORMAL HIGH (ref 0–99)
Triglycerides: 122 mg/dL (ref 0–149)
VLDL Cholesterol Cal: 21 mg/dL (ref 5–40)

## 2019-03-29 LAB — TSH: TSH: 1.35 u[IU]/mL (ref 0.450–4.500)

## 2019-03-30 ENCOUNTER — Telehealth: Payer: Self-pay | Admitting: *Deleted

## 2019-03-30 NOTE — Telephone Encounter (Signed)
LMOVM for pt to return call. Okay for [facility] triage to give patient results.  

## 2019-03-30 NOTE — Telephone Encounter (Signed)
-----   Message from Jerrol Banana., MD sent at 03/29/2019  7:44 AM EST ----- Labs all stable.  Continue to work on diet and exercise for high cholesterol

## 2019-04-02 NOTE — Telephone Encounter (Signed)
Pt. returned call.  Advised of result note per Dr. Rosanna Randy from 03/29/19.  Stated she knew her cholesterol was elevated.  Pt. Verb. Understanding of need to work on diet and exercise.

## 2019-04-22 DIAGNOSIS — W540XXA Bitten by dog, initial encounter: Secondary | ICD-10-CM | POA: Diagnosis not present

## 2019-04-22 DIAGNOSIS — S51851A Open bite of right forearm, initial encounter: Secondary | ICD-10-CM | POA: Diagnosis not present

## 2019-04-22 DIAGNOSIS — Z23 Encounter for immunization: Secondary | ICD-10-CM | POA: Insufficient documentation

## 2019-04-22 DIAGNOSIS — Y999 Unspecified external cause status: Secondary | ICD-10-CM | POA: Diagnosis not present

## 2019-05-03 NOTE — Progress Notes (Signed)
Established patient visit     I,Bretlyn Ward,acting as a scribe for Trinna Post, PA-C.,have documented all relevant documentation on the behalf of Trinna Post, PA-C,as directed by  Trinna Post, PA-C while in the presence of Trinna Post, PA-C.   Patient: Elizabeth Morgan   DOB: 1950-08-16   69 y.o. Female  MRN: [id number] Visit Date: 05/04/2019  Today's healthcare provider: Trinna Post, PA-C  Subjective:    Chief Complaint  Patient presents with  . Animal Bite   Animal Bite  The incident occurred more than 2 days ago. There is an injury to the right forearm. The pain is mild. She is left-handed. Her tetanus status is UTD. Recently, medical care has been given at another facility.   The bite occurred on 04/22/2019, The patient was seen at an urgent care [place] where the incident occurred. She was given amoxicillin to take 2 x a day for 10 days. She finished her last dose 05/01/2019. She stated that the wound is looking better but she wanted a medical personell to evaluate it again to make sure it is healing correctly. She has been using a neosporin bandage and gauze to wrap it and changing it every 3 days. She got an updated tetanus 04/22/2019. The dog is hers personal pet and is UTD on rabies vaccination.    .  Medications: Outpatient Medications Prior to Visit  Medication Sig  . [DISCONTINUED] Cetirizine HCl (ZYRTEC ALLERGY) 10 MG CAPS  daily, 0 Refill(s), Type: Maintenance  . Cetirizine HCl (ZYRTEC ALLERGY PO) Take by mouth daily.  . fexofenadine (ALLEGRA) 180 MG tablet Take 180 mg by mouth daily.   No facility-administered medications prior to visit.    Review of Systems  Constitutional: Negative.   HENT: Negative.   Eyes: Negative.   Respiratory: Negative.   Gastrointestinal: Negative.   Endocrine: Negative.   Genitourinary: Negative.   Musculoskeletal: Negative.   Skin: Positive for wound.  Allergic/Immunologic: Negative.     Neurological: Negative.   Hematological: Negative.   Psychiatric/Behavioral: Negative.         Objective:    BP (!) 142/64   Pulse 83   Temp (!) 97.2 F (36.2 C) (Temporal)   Resp 16   Ht 5\' 3"  (1.6 m)   Wt 162 lb (73.5 kg)   BMI 28.70 kg/m    Physical Exam Constitutional:      Appearance: Normal appearance.  Cardiovascular:     Rate and Rhythm: Normal rate.  Pulmonary:     Effort: Pulmonary effort is normal.  Skin:    General: Skin is warm and dry.          Comments: 3 mm erythemas bite wound on the right forearm.   Neurological:     Mental Status: She is alert and oriented to person, place, and time.  Psychiatric:        Mood and Affect: Mood normal.        Behavior: Behavior normal.    Media Information   Document Information  Photos  Right forearm   05/04/2019 11:06  Attached To:  Office Visit on 05/04/19 with Trinna Post, PA-C  Source Information  Trinna Post, PA-C  [facility]      No results found for any visits on 05/04/19.    Assessment & Plan:    1. Bite wound  Make sure to change your bandage daily. No need to use neosporin. Watch  for signs of infection. Doxycycline has been prescribed if things start to get worse.   - doxycycline (VIBRA-TABS) 100 MG tablet; Take 1 tablet (100 mg total) by mouth 2 (two) times daily.  Dispense: 14 tablet; Refill: 0   Return if symptoms worsen or fail to improve.     ITrinna Post, PA-C, have reviewed all documentation for this visit. The documentation on 05/04/19 for the exam, diagnosis, procedures, and orders are all accurate and complete.     Paulene Floor  [facility] [phone] (phone) [phone] (fax)  [place]

## 2019-05-04 ENCOUNTER — Other Ambulatory Visit: Payer: Self-pay

## 2019-05-04 ENCOUNTER — Ambulatory Visit (INDEPENDENT_AMBULATORY_CARE_PROVIDER_SITE_OTHER): Payer: Medicare Other | Admitting: Physician Assistant

## 2019-05-04 VITALS — BP 142/64 | HR 83 | Temp 97.2°F | Resp 16 | Ht 63.0 in | Wt 162.0 lb

## 2019-05-04 DIAGNOSIS — T148XXA Other injury of unspecified body region, initial encounter: Secondary | ICD-10-CM | POA: Diagnosis not present

## 2019-05-04 MED ORDER — DOXYCYCLINE HYCLATE 100 MG PO TABS: 100.0000 mg | ORAL_TABLET | Freq: Two times a day (BID) | ORAL | 0 refills | Status: DC

## 2019-05-04 NOTE — Patient Instructions (Addendum)
Make sure to change your bandage daily. No need to use neosporin. Watch for signs of infection. Doxycycline has been prescribed if things start to get worse.   Wound Care, Adult Taking care of your wound properly can help to prevent pain, infection, and scarring. It can also help your wound to heal more quickly. How to care for your wound Wound care      Follow instructions from your health care provider about how to take care of your wound. Make sure you: ? Wash your hands with soap and water before you change the bandage (dressing). If soap and water are not available, use hand sanitizer. ? Change your dressing as told by your health care provider. ? Leave stitches (sutures), skin glue, or adhesive strips in place. These skin closures may need to stay in place for 2 weeks or longer. If adhesive strip edges start to loosen and curl up, you may trim the loose edges. Do not remove adhesive strips completely unless your health care provider tells you to do that.  Check your wound area every day for signs of infection. Check for: ? Redness, swelling, or pain. ? Fluid or blood. ? Warmth. ? Pus or a bad smell.  Ask your health care provider if you should clean the wound with mild soap and water. Doing this may include: ? Using a clean towel to pat the wound dry after cleaning it. Do not rub or scrub the wound. ? Applying a cream or ointment. Do this only as told by your health care provider. ? Covering the incision with a clean dressing.  Ask your health care provider when you can leave the wound uncovered.  Keep the dressing dry until your health care provider says it can be removed. Do not take baths, swim, use a hot tub, or do anything that would put the wound underwater until your health care provider approves. Ask your health care provider if you can take showers. You may only be allowed to take sponge baths. Medicines   If you were prescribed an antibiotic medicine, cream, or  ointment, take or use the antibiotic as told by your health care provider. Do not stop taking or using the antibiotic even if your condition improves.  Take over-the-counter and prescription medicines only as told by your health care provider. If you were prescribed pain medicine, take it 30 or more minutes before you do any wound care or as told by your health care provider. General instructions  Return to your normal activities as told by your health care provider. Ask your health care provider what activities are safe.  Do not scratch or pick at the wound.  Do not use any products that contain nicotine or tobacco, such as cigarettes and e-cigarettes. These may delay wound healing. If you need help quitting, ask your health care provider.  Keep all follow-up visits as told by your health care provider. This is important.  Eat a diet that includes protein, vitamin A, vitamin C, and other nutrient-rich foods to help the wound heal. ? Foods rich in protein include meat, dairy, beans, nuts, and other sources. ? Foods rich in vitamin A include carrots and dark green, leafy vegetables. ? Foods rich in vitamin C include citrus, tomatoes, and other fruits and vegetables. ? Nutrient-rich foods have protein, carbohydrates, fat, vitamins, or minerals. Eat a variety of healthy foods including vegetables, fruits, and whole grains. Contact a health care provider if:  You received a tetanus shot and you have  swelling, severe pain, redness, or bleeding at the injection site.  Your pain is not controlled with medicine.  You have redness, swelling, or pain around the wound.  You have fluid or blood coming from the wound.  Your wound feels warm to the touch.  You have pus or a bad smell coming from the wound.  You have a fever or chills.  You are nauseous or you vomit.  You are dizzy. Get help right away if:  You have a red streak going away from your wound.  The edges of the wound open up  and separate.  Your wound is bleeding, and the bleeding does not stop with gentle pressure.  You have a rash.  You faint.  You have trouble breathing. Summary  Always wash your hands with soap and water before changing your bandage (dressing).  To help with healing, eat foods that are rich in protein, vitamin A, vitamin C, and other nutrients.  Check your wound every day for signs of infection. Contact your health care provider if you suspect that your wound is infected. This information is not intended to replace advice given to you by your health care provider. Make sure you discuss any questions you have with your health care provider. Document Revised: 04/24/2018 Document Reviewed: 07/22/2015 Elsevier Patient Education  [place].

## 2019-05-17 ENCOUNTER — Other Ambulatory Visit: Payer: PRIVATE HEALTH INSURANCE

## 2019-05-21 ENCOUNTER — Ambulatory Visit
Admission: RE | Admit: 2019-05-21 | Discharge: 2019-05-21 | Disposition: A | Discharge status: Home / Self Care | Payer: Medicare Other | Source: Ambulatory Visit | Attending: Family Medicine | Admitting: Family Medicine

## 2019-05-21 ENCOUNTER — Other Ambulatory Visit: Payer: Self-pay | Admitting: Family Medicine

## 2019-05-21 DIAGNOSIS — R928 Other abnormal and inconclusive findings on diagnostic imaging of breast: Secondary | ICD-10-CM

## 2019-05-21 DIAGNOSIS — Z13820 Encounter for screening for osteoporosis: Secondary | ICD-10-CM | POA: Diagnosis not present

## 2019-05-21 DIAGNOSIS — Z780 Asymptomatic menopausal state: Secondary | ICD-10-CM | POA: Diagnosis not present

## 2019-05-21 DIAGNOSIS — Z1231 Encounter for screening mammogram for malignant neoplasm of breast: Secondary | ICD-10-CM | POA: Insufficient documentation

## 2019-06-05 ENCOUNTER — Other Ambulatory Visit: Payer: Self-pay | Admitting: Family Medicine

## 2019-06-05 ENCOUNTER — Ambulatory Visit
Admission: RE | Admit: 2019-06-05 | Discharge: 2019-06-05 | Disposition: A | Discharge status: Home / Self Care | Payer: Medicare Other | Source: Ambulatory Visit | Attending: Family Medicine | Admitting: Family Medicine

## 2019-06-05 DIAGNOSIS — N6320 Unspecified lump in the left breast, unspecified quadrant: Secondary | ICD-10-CM

## 2019-06-05 DIAGNOSIS — R928 Other abnormal and inconclusive findings on diagnostic imaging of breast: Secondary | ICD-10-CM | POA: Diagnosis not present

## 2019-06-05 DIAGNOSIS — N6489 Other specified disorders of breast: Secondary | ICD-10-CM | POA: Diagnosis not present

## 2019-06-05 DIAGNOSIS — R920 Mammographic microcalcification found on diagnostic imaging of breast: Secondary | ICD-10-CM | POA: Diagnosis not present

## 2019-06-08 ENCOUNTER — Ambulatory Visit
Admission: RE | Admit: 2019-06-08 | Discharge: 2019-06-08 | Disposition: A | Discharge status: Home / Self Care | Payer: Medicare Other | Source: Ambulatory Visit | Attending: Family Medicine | Admitting: Family Medicine

## 2019-06-08 DIAGNOSIS — R928 Other abnormal and inconclusive findings on diagnostic imaging of breast: Secondary | ICD-10-CM | POA: Diagnosis not present

## 2019-06-08 DIAGNOSIS — C50812 Malignant neoplasm of overlapping sites of left female breast: Secondary | ICD-10-CM | POA: Diagnosis not present

## 2019-06-08 DIAGNOSIS — N6320 Unspecified lump in the left breast, unspecified quadrant: Secondary | ICD-10-CM | POA: Diagnosis not present

## 2019-06-08 DIAGNOSIS — N6325 Unspecified lump in the left breast, overlapping quadrants: Secondary | ICD-10-CM | POA: Diagnosis not present

## 2019-06-08 HISTORY — PX: BREAST BIOPSY: SHX20

## 2019-06-11 DIAGNOSIS — C50919 Malignant neoplasm of unspecified site of unspecified female breast: Secondary | ICD-10-CM

## 2019-06-11 HISTORY — DX: Malignant neoplasm of unspecified site of unspecified female breast: C50.919

## 2019-06-11 NOTE — Progress Notes (Signed)
Initiated navigation. Patient is going out of town for several days , leaving 06/14/19. Scheduled surgical consult, and Med/Onc consult for 06/12/19.  Will accompany to Med/Onc consult.

## 2019-06-12 ENCOUNTER — Encounter: Payer: Self-pay | Admitting: Oncology

## 2019-06-12 ENCOUNTER — Inpatient Hospital Stay: Payer: Medicare Other

## 2019-06-12 ENCOUNTER — Inpatient Hospital Stay: Payer: Medicare Other | Attending: Oncology | Admitting: Oncology

## 2019-06-12 ENCOUNTER — Other Ambulatory Visit: Payer: Self-pay

## 2019-06-12 VITALS — BP 120/68 | HR 68 | Temp 98.5°F | Resp 16 | Wt 163.1 lb

## 2019-06-12 DIAGNOSIS — Z170 Estrogen receptor positive status [ER+]: Secondary | ICD-10-CM | POA: Diagnosis not present

## 2019-06-12 DIAGNOSIS — Z7189 Other specified counseling: Secondary | ICD-10-CM

## 2019-06-12 DIAGNOSIS — I10 Essential (primary) hypertension: Secondary | ICD-10-CM | POA: Insufficient documentation

## 2019-06-12 DIAGNOSIS — Z803 Family history of malignant neoplasm of breast: Secondary | ICD-10-CM | POA: Insufficient documentation

## 2019-06-12 DIAGNOSIS — Z79899 Other long term (current) drug therapy: Secondary | ICD-10-CM | POA: Diagnosis not present

## 2019-06-12 DIAGNOSIS — K219 Gastro-esophageal reflux disease without esophagitis: Secondary | ICD-10-CM | POA: Insufficient documentation

## 2019-06-12 DIAGNOSIS — C50919 Malignant neoplasm of unspecified site of unspecified female breast: Secondary | ICD-10-CM | POA: Diagnosis not present

## 2019-06-12 DIAGNOSIS — Z87891 Personal history of nicotine dependence: Secondary | ICD-10-CM | POA: Diagnosis not present

## 2019-06-12 DIAGNOSIS — E785 Hyperlipidemia, unspecified: Secondary | ICD-10-CM | POA: Insufficient documentation

## 2019-06-12 DIAGNOSIS — Z801 Family history of malignant neoplasm of trachea, bronchus and lung: Secondary | ICD-10-CM | POA: Insufficient documentation

## 2019-06-12 DIAGNOSIS — C50812 Malignant neoplasm of overlapping sites of left female breast: Secondary | ICD-10-CM | POA: Diagnosis not present

## 2019-06-12 NOTE — Progress Notes (Signed)
Patient here for initial oncology appointment, concerns of anxiety about treatment options.

## 2019-06-12 NOTE — Progress Notes (Signed)
Supported patient , husband at initial Med/On visit with Dr. Janese Banks.  Completed MYCHART access with patient for virtual visit with Dr. Janese Banks scheduled 06/19/19.

## 2019-06-14 LAB — SURGICAL PATHOLOGY

## 2019-06-15 DIAGNOSIS — C50919 Malignant neoplasm of unspecified site of unspecified female breast: Secondary | ICD-10-CM | POA: Insufficient documentation

## 2019-06-15 DIAGNOSIS — Z7189 Other specified counseling: Secondary | ICD-10-CM | POA: Insufficient documentation

## 2019-06-15 NOTE — Progress Notes (Addendum)
Hematology/Oncology Consult note [facility] [phone] [phone]  Patient Care Team: Jerrol Banana., MD as PCP - General (Family Medicine) Theodore Demark, RN as Oncology Nurse Navigator   Name of the patient: Elizabeth Morgan  [id number]  26-Sep-1950    Reason for referral-new diagnosis of breast cancer   Referring physician-Dr. Miguel Aschoff  Date of visit: 06/15/19   History of presenting illness- Patient is a 69 year old female who underwent a recent screening mammogram on 05/21/2019 which showed abnormality in the left breast.  This was followed by diagnostic mammogram and ultrasound which showed a 2.4 x 1.4 x 1.9 cm mass in the left breast.  No lymphadenopathy seen in the left axilla.  Core biopsy showed a 15 mm grade 3 invasive mammary carcinoma with focal mucinous features.  ER/PR and HER-2 status was not available at the time of her visit  Patient is currently doing well for her age she does not have any significant comorbidities.  She is independent of her ADLs and IADLs.Patient sister had breast cancer at the age of 96.   ECOG PS- 1  Pain scale- 0   Review of systems- Review of Systems  Constitutional: Negative for chills, fever, malaise/fatigue and weight loss.  HENT: Negative for congestion, ear discharge and nosebleeds.   Eyes: Negative for blurred vision.  Respiratory: Negative for cough, hemoptysis, sputum production, shortness of breath and wheezing.   Cardiovascular: Negative for chest pain, palpitations, orthopnea and claudication.  Gastrointestinal: Negative for abdominal pain, blood in stool, constipation, diarrhea, heartburn, melena, nausea and vomiting.  Genitourinary: Negative for dysuria, flank pain, frequency, hematuria and urgency.  Musculoskeletal: Negative for back pain, joint pain and myalgias.  Skin: Negative for rash.  Neurological: Negative for dizziness, tingling, focal weakness, seizures,  weakness and headaches.  Endo/Heme/Allergies: Does not bruise/bleed easily.  Psychiatric/Behavioral: Negative for depression and suicidal ideas. The patient does not have insomnia.     Allergies  Allergen Reactions  . Aleve [Naproxen Sodium] Other (See Comments)    BURN STOMACH  . Aspirin Other (See Comments)    BURN STOMACH  . Chicken Protein     Dizziness  . Codeine Nausea And Vomiting  . Naproxen     Patient Active Problem List   Diagnosis Date Noted  . Dog bite of right forearm 04/22/2019  . Need for immunization against tetanus alone 04/22/2019  . Essential (primary) hypertension 09/03/2008  . Headache, migraine 09/20/2007  . Allergic rhinitis, seasonal 01/25/2001  . History of colon polyps 07/18/2000  . Deficiency anemia 06/15/2000  . Combined fat and carbohydrate induced hyperlipemia 08/04/1999  . Acid reflux 07/03/1997  . Adiposity 07/13/1995     Past Medical History:  Diagnosis Date  . Allergy   . Hearing loss   . Hernia, abdominal    showed up end of April 2018  . History of herpes zoster 11/05/2015  . Seasonal allergies      Past Surgical History:  Procedure Laterality Date  . ARTHRODESIS METATARSALPHALANGEAL JOINT (MTPJ) Left 06/03/2016   Procedure: ARTHRODESIS METATARSALPHALANGEAL JOINT (MTPJ);  Surgeon: Albertine Patricia, DPM;  Location: [place];  Service: Podiatry;  Laterality: Left;  . CESAREAN SECTION    . COLONOSCOPY    . EYE SURGERY Bilateral    X 2  . HAMMER TOE SURGERY Left 06/03/2016   Procedure: HAMMER TOE CORRECTION - 2nd left toe;  Surgeon: Albertine Patricia, DPM;  Location: [place];  Service: Podiatry;  Laterality: Left;  .  TUBAL LIGATION    . WEIL OSTEOTOMY Left 06/03/2016   Procedure: WEIL OSTEOTOMY  SECOND TOE;  Surgeon: Albertine Patricia, DPM;  Location: [place];  Service: Podiatry;  Laterality: Left;    Social History   Socioeconomic History  . Marital status: Married    Spouse name: Not on file   . Number of children: 1  . Years of education: Not on file  . Highest education level: Bachelor's degree (e.g., BA, AB, BS)  Occupational History  . Occupation: retired  Tobacco Use  . Smoking status: Former Smoker    Years: 10.00    Quit date: 1998    Years since quitting: 23.4  . Smokeless tobacco: Never Used  . Tobacco comment: Quit about 30 years ago  Substance and Sexual Activity  . Alcohol use: Yes    Comment: occasionally, 1x/month  . Drug use: No  . Sexual activity: Never  Other Topics Concern  . Not on file  Social History Narrative  . Not on file   Social Determinants of Health   Financial Resource Strain: Low Risk   . Difficulty of Paying Living Expenses: Not hard at all  Food Insecurity: No Food Insecurity  . Worried About Charity fundraiser in the Last Year: Never true  . Ran Out of Food in the Last Year: Never true  Transportation Needs: No Transportation Needs  . Lack of Transportation (Medical): No  . Lack of Transportation (Non-Medical): No  Physical Activity: Inactive  . Days of Exercise per Week: 0 days  . Minutes of Exercise per Session: 0 min  Stress: No Stress Concern Present  . Feeling of Stress : Not at all  Social Connections: Slightly Isolated  . Frequency of Communication with Friends and Family: More than three times a week  . Frequency of Social Gatherings with Friends and Family: Never  . Attends Religious Services: More than 4 times per year  . Active Member of Clubs or Organizations: No  . Attends Archivist Meetings: Never  . Marital Status: Married  Human resources officer Violence: Not At Risk  . Fear of Current or Ex-Partner: No  . Emotionally Abused: No  . Physically Abused: No  . Sexually Abused: No     Family History  Problem Relation Age of Onset  . Dementia Mother   . Dementia Father   . Hypertension Father   . Heart disease Father   . Lung cancer Father   . Breast cancer Sister 29     Current Outpatient  Medications:  .  Cetirizine HCl (ZYRTEC ALLERGY PO), Take by mouth daily., Disp: , Rfl:  .  fexofenadine (ALLEGRA) 180 MG tablet, Take 180 mg by mouth daily., Disp: , Rfl:  .  doxycycline (VIBRA-TABS) 100 MG tablet, Take 1 tablet (100 mg total) by mouth 2 (two) times daily. (Patient not taking: Reported on 06/12/2019), Disp: 14 tablet, Rfl: 0   Physical exam:  Vitals:   06/12/19 1447  BP: 120/68  Pulse: 68  Resp: 16  Temp: 98.5 F (36.9 C)  TempSrc: Oral  SpO2: 99%  Weight: 163 lb 1.6 oz (74 kg)   Physical Exam Constitutional:      General: She is not in acute distress. Cardiovascular:     Rate and Rhythm: Normal rate and regular rhythm.     Heart sounds: Normal heart sounds.  Pulmonary:     Effort: Pulmonary effort is normal.     Breath sounds: Normal breath sounds.  Abdominal:  General: Bowel sounds are normal.     Palpations: Abdomen is soft.  Skin:    General: Skin is warm and dry.  Neurological:     Mental Status: She is alert and oriented to person, place, and time.   Breast exam performed in sitting and lying down position.  No palpable bilateral axillary adenopathy.  No palpable masses in the right breast.  There is induration and ecchymosis over the region of the left breast biopsy.  Difficult to ascertain the size of the actual breast mass.    CMP Latest Ref Rng & Units 03/28/2019  Glucose 65 - 99 mg/dL 84  BUN 8 - 27 mg/dL 18  Creatinine 0.57 - 1.00 mg/dL 0.67  Sodium 134 - 144 mmol/L 138  Potassium 3.5 - 5.2 mmol/L 4.6  Chloride 96 - 106 mmol/L 100  CO2 20 - 29 mmol/L 24  Calcium 8.7 - 10.3 mg/dL 9.8  Total Protein 6.0 - 8.5 g/dL 6.9  Total Bilirubin 0.0 - 1.2 mg/dL 0.4  Alkaline Phos 39 - 117 IU/L 89  AST 0 - 40 IU/L 24  ALT 0 - 32 IU/L 21   CBC Latest Ref Rng & Units 03/28/2019  WBC 3.4 - 10.8 x10E3/uL 6.7  Hemoglobin 11.1 - 15.9 g/dL 14.3  Hematocrit 34.0 - 46.6 % 42.2  Platelets 150 - 450 x10E3/uL 253    No images are attached to the  encounter.  DEXAScan  Result Date: 05/21/2019 EXAM: DUAL X-RAY ABSORPTIOMETRY (DXA) FOR BONE MINERAL DENSITY IMPRESSION: Your patient Arloa Prak completed a BMD test on 05/21/2019 using the [place] (software version: 14.10) manufactured by [employer]. The following summarizes the results of our evaluation. Technologist: SCE PATIENT BIOGRAPHICAL: Name: Tyara, Dassow Patient ID: [id number] Birth Date: [birth year] Height: 62.0 in. Gender: Female Exam Date: 05/21/2019 Weight: 162.3 lbs. Indications: Caucasian, Height Loss, Postmenopausal Fractures: Treatments: Allegra, ZYRTEC DENSITOMETRY RESULTS: Site      Region     Measured Date Measured Age WHO Classification Young Adult T-score BMD         %Change vs. Previous Significant Change (*) AP Spine L1-L4 05/21/2019 66.6 Normal -0.6 1.125 g/cm2 - - DualFemur Total Left 05/21/2019 66.6 Normal -0.8 0.902 g/cm2 - - ASSESSMENT: The BMD measured at Femur Total Left is 0.902 g/cm2 with a T-score of -0.8. This patient is considered normal according to [facility]) criteria. The scan quality is good. World Pharmacologist [facility]) criteria for post-menopausal, Caucasian Women: Normal:                   T-score at or above -1 SD Osteopenia/low bone mass: T-score between -1 and -2.5 SD Osteoporosis:             T-score at or below -2.5 SD RECOMMENDATIONS: 1. All patients should optimize calcium and vitamin D intake. 2. Consider FDA-approved medical therapies in postmenopausal women and men aged 72 years and older, based on the following: a. A hip or vertebral(clinical or morphometric) fracture b. T-score < -2.5 at the femoral neck or spine after appropriate evaluation to exclude secondary causes c. Low bone mass (T-score between -1.0 and -2.5 at the femoral neck or spine) and a 10-year probability of a hip fracture > 3% or a 10-year probability of a major osteoporosis-related fracture > 20% based on the [place]-adapted WHO  algorithm 3. Clinician judgment and/or patient preferences may indicate treatment for people with 10-year fracture probabilities above or below these levels FOLLOW-UP:  People with diagnosed cases of osteoporosis or at high risk for fracture should have regular bone mineral density tests. For patients eligible for Medicare, routine testing is allowed once every 2 years. The testing frequency can be increased to one year for patients who have rapidly progressing disease, those who are receiving or discontinuing medical therapy to restore bone mass, or have additional risk factors. I have reviewed this report, and agree with the above findings. [facility] Electronically Signed   By: Lowella Grip III M.D.   On: 05/21/2019 10:12   US BREAST LTD UNI LEFT INC AXILLA  Result Date: 06/05/2019 CLINICAL DATA:  Screening recall for possible left breast mass EXAM: DIGITAL DIAGNOSTIC UNILATERAL LEFT MAMMOGRAM WITH CAD AND TOMO LEFT BREAST ULTRASOUND COMPARISON:  PREVIOUS EXAMS. ACR Breast Density Category b: There are scattered areas of fibroglandular density. FINDINGS: Spot compression tomograms were performed over the central/retroareolar left breast demonstrating a spiculated irregular mass measuring 2.1 cm. There are a few microcalcifications associated with this mass. Mammographic images were processed with CAD. Targeted ultrasound of the central left breast was performed. There is an irregular hypoechoic mass in the left breast at 12 o'clock retroareolar measuring 2.4 x 1.4 x 1.9 cm. This corresponds well with the mass seen in the left breast at mammography. No lymphadenopathy is seen in the left axilla. IMPRESSION: Suspicious 2.4 cm mass in the left breast at 12 o'clock retroareolar. RECOMMENDATION: Recommend ultrasound-guided biopsy of the mass in the left breast at 12 o'clock retroareolar. This will be scheduled for the patient. I have discussed the findings and recommendations with the patient.  If applicable, a reminder letter will be sent to the patient regarding the next appointment. BI-RADS CATEGORY  5: Highly suggestive of malignancy. Electronically Signed   By: Everlean Alstrom M.D.   On: 06/05/2019 11:23   MM DIAG BREAST TOMO UNI LEFT  Result Date: 06/05/2019 CLINICAL DATA:  Screening recall for possible left breast mass EXAM: DIGITAL DIAGNOSTIC UNILATERAL LEFT MAMMOGRAM WITH CAD AND TOMO LEFT BREAST ULTRASOUND COMPARISON:  PREVIOUS EXAMS. ACR Breast Density Category b: There are scattered areas of fibroglandular density. FINDINGS: Spot compression tomograms were performed over the central/retroareolar left breast demonstrating a spiculated irregular mass measuring 2.1 cm. There are a few microcalcifications associated with this mass. Mammographic images were processed with CAD. Targeted ultrasound of the central left breast was performed. There is an irregular hypoechoic mass in the left breast at 12 o'clock retroareolar measuring 2.4 x 1.4 x 1.9 cm. This corresponds well with the mass seen in the left breast at mammography. No lymphadenopathy is seen in the left axilla. IMPRESSION: Suspicious 2.4 cm mass in the left breast at 12 o'clock retroareolar. RECOMMENDATION: Recommend ultrasound-guided biopsy of the mass in the left breast at 12 o'clock retroareolar. This will be scheduled for the patient. I have discussed the findings and recommendations with the patient. If applicable, a reminder letter will be sent to the patient regarding the next appointment. BI-RADS CATEGORY  5: Highly suggestive of malignancy. Electronically Signed   By: Everlean Alstrom M.D.   On: 06/05/2019 11:23   MM 3D SCREEN BREAST BILATERAL  Result Date: 05/21/2019 CLINICAL DATA:  Screening. EXAM: DIGITAL SCREENING BILATERAL MAMMOGRAM WITH TOMO AND CAD COMPARISON:  Previous exam(s). ACR Breast Density Category b: There are scattered areas of fibroglandular density. FINDINGS: In the left breast, a possible mass warrants  further evaluation. In the right breast, no findings suspicious for malignancy. Images were processed with CAD. IMPRESSION: Further  evaluation is suggested for possible mass in the left breast. RECOMMENDATION: Diagnostic mammogram and possibly ultrasound of the left breast. (Code:[id number]) The patient will be contacted regarding the findings, and additional imaging will be scheduled. BI-RADS CATEGORY  0: Incomplete. Need additional imaging evaluation and/or prior mammograms for comparison. Electronically Signed   By: Nolon Nations M.D.   On: 05/21/2019 10:02   MM CLIP PLACEMENT LEFT  Result Date: 06/08/2019 CLINICAL DATA:  Evaluate biopsy marker EXAM: DIAGNOSTIC LEFT MAMMOGRAM POST ULTRASOUND BIOPSY COMPARISON:  Previous exam(s). FINDINGS: Mammographic images were obtained following ultrasound guided biopsy of a left breast mass. The biopsy marking clip is within the biopsied left breast mass IMPRESSION: Appropriate positioning of the coil shaped biopsy marking clip at the site of biopsy in the biopsy left breast mass. Final Assessment: Post Procedure Mammograms for Marker Placement Electronically Signed   By: Dorise Bullion III M.D   On: 06/08/2019 10:59   [place] LT BREAST BX W LOC DEV 1ST LESION IMG BX SPEC US GUIDE  Addendum Date: 06/12/2019   ADDENDUM REPORT: 06/11/2019 13:58 ADDENDUM: PATHOLOGY revealed: A. BREAST, LEFT 12:00; ULTRASOUND-GUIDED BIOPSY: - INVASIVE MAMMARY CARCINOMA WITH FOCAL MUCINOUS FEATURES. 15 mm in this sample. Grade 3. Ductal carcinoma in situ: Not identified. Lymphovascular invasion: Not identified. Pathology results are CONCORDANT with imaging findings, per Dr. Dorise Bullion. Pathology results and recommendations below were discussed with patient by telephone on 06/11/2019. Patient reported biopsy site doing well with slight tenderness at the site. Post biopsy care instructions were reviewed and questions were answered. Patient was instructed to call [facility] if any  concerns or questions arise related to the biopsy. Recommendation: Surgical referral. Request for surgical referral was relayed to [place] and Tanya Nones RN at [facility] by Electa Sniff RN on 06/11/2019. Addendum by Electa Sniff RN on 06/11/2019. Electronically Signed   By: Dorise Bullion III M.D   On: 06/11/2019 13:58   Result Date: 06/12/2019 CLINICAL DATA:  Biopsy of a left breast mass EXAM: ULTRASOUND GUIDED LEFT BREAST CORE NEEDLE BIOPSY COMPARISON:  Previous exam(s). PROCEDURE: I met with the patient and we discussed the procedure of ultrasound-guided biopsy, including benefits and alternatives. We discussed the high likelihood of a successful procedure. We discussed the risks of the procedure, including infection, bleeding, tissue injury, clip migration, and inadequate sampling. Informed written consent was given. The usual time-out protocol was performed immediately prior to the procedure. Lesion quadrant: 12 o'clock Using sterile technique and 1% Lidocaine as local anesthetic, under direct ultrasound visualization, a 12 gauge spring-loaded device was used to perform biopsy of a 12 o'clock left breast mass using a lateral approach. At the conclusion of the procedure coil shaped tissue marker clip was deployed into the biopsy cavity. Follow up 2 view mammogram was performed and dictated separately. IMPRESSION: Ultrasound guided biopsy of a left breast mass at 12. No apparent complications. Electronically Signed: By: Dorise Bullion III M.D On: 06/08/2019 11:01    Assessment and plan- Patient is a 69 y.o. female with newly diagnosed invasive mammary carcinoma of the left breast anatomical staging stage II cT2 cN0 cM0.  ER/PR and HER-2 status was pending at the time of visit  Discussed with the patient the results of the mammogram and pathology with the patient in detail.  Anatomically she has a stage II breast cancer given that the size of her primary breast mass is more than  2 cm.  At the time of her visit with  me ER/PR and HER-2 status was pending.  Discussed various probabilities and treatment options based on her ER/PR and HER-2 status.  If she has triple negative breast cancer or HER-2 positive breast cancer there would be a role for neoadjuvant chemotherapy.  However if she has ER/PR positive and HER-2 negative breast cancer, I would recommend upfront lumpectomy and sentinel lymph node biopsy.  She has already seen Dr. Bary Castilla.  Patient will be going for a beach vacation for a week.  I will plan to have a video visit with her next week after her receptor status is back.  If she has ER/PR positive and HER-2 negative disease she would benefit from Oncotype testing after surgery to determine if she would benefit from chemotherapy or not.  Discussed briefly what Oncotype testing is and how the results are interpreted.  At her age if her score is 55 or higher she would benefit from adjuvant chemotherapy but not if her score is 25 or lower.  There would also be a role for hormone therapy for at least 5 years if she has ER positive disease.  Patient would also benefit from adjuvant radiation treatment after surgery but if she needs adjuvant chemotherapy chemotherapy will come first before radiation treatment.  Treatment will be given with a curative intent.  Patient verbalized understanding of the plan.   She would also qualify for genetic testing based on personal history of breast cancer in history of breast cancer in her sister.  We will send genetic referral   Total face to face encounter time for this patient visit was 45 min.    Thank you for this kind referral and the opportunity to participate in the care of this patient   Visit Diagnosis 1. Invasive carcinoma of breast ([place])   2. Goals of care, counseling/discussion     Dr. Randa Evens, MD, MPH [facility] at [facility] [phone] 06/15/2019  1:52 PM

## 2019-06-19 ENCOUNTER — Other Ambulatory Visit: Payer: Self-pay

## 2019-06-19 ENCOUNTER — Inpatient Hospital Stay: Payer: Medicare Other | Admitting: Oncology

## 2019-06-19 ENCOUNTER — Other Ambulatory Visit: Payer: Self-pay | Admitting: General Surgery

## 2019-06-19 DIAGNOSIS — C50412 Malignant neoplasm of upper-outer quadrant of left female breast: Secondary | ICD-10-CM

## 2019-06-25 ENCOUNTER — Other Ambulatory Visit: Payer: Self-pay | Admitting: General Surgery

## 2019-06-25 DIAGNOSIS — Z853 Personal history of malignant neoplasm of breast: Secondary | ICD-10-CM

## 2019-06-26 ENCOUNTER — Encounter
Admission: RE | Admit: 2019-06-26 | Discharge: 2019-06-26 | Disposition: A | Discharge status: Home / Self Care | Payer: Medicare Other | Source: Ambulatory Visit | Attending: General Surgery | Admitting: General Surgery

## 2019-06-26 ENCOUNTER — Other Ambulatory Visit: Payer: Self-pay

## 2019-06-26 DIAGNOSIS — Z01818 Encounter for other preprocedural examination: Secondary | ICD-10-CM | POA: Diagnosis not present

## 2019-06-26 DIAGNOSIS — Z20822 Contact with and (suspected) exposure to covid-19: Secondary | ICD-10-CM | POA: Insufficient documentation

## 2019-06-26 HISTORY — DX: Malignant (primary) neoplasm, unspecified: C80.1

## 2019-06-26 NOTE — Patient Instructions (Addendum)
Your procedure is scheduled on: Friday 6/11 Report to nuclear med in the medical mall at 11:30  Remember: Instructions that are not followed completely may result in serious medical risk,  up to and including death, or upon the discretion of your surgeon and anesthesiologist your  surgery may need to be rescheduled.     _X__ 1. Do not eat food after midnight the night before your procedure.                 No gum chewing or hard candies. You may drink clear liquids up to 2 hours                 before you are scheduled to arrive for your surgery- DO not drink clear                 liquids within 2 hours of the start of your surgery.                 Clear Liquids include:  water, apple juice without pulp, clear Gatorade, G2 or                  Gatorade Zero (avoid Red/Purple/Blue), Black Coffee or Tea (Do not add                 anything to coffee or tea). _____2.   Complete the carbohydrate drink provided to you, 2 hours before arrival.  __X__2.  On the morning of surgery brush your teeth with toothpaste and water, you                may rinse your mouth with mouthwash if you wish.  Do not swallow any toothpaste of mouthwash.     _X__ 3.  No Alcohol for 24 hours before or after surgery.   ___ 4.  Do Not Smoke or use e-cigarettes For 24 Hours Prior to Your Surgery.                 Do not use any chewable tobacco products for at least 6 hours prior to                 Surgery.  ___  5.  Do not use any recreational drugs (marijuana, cocaine, heroin, ecstacy, MDMA or other)                For at least one week prior to your surgery.  Combination of these drugs with anesthesia                May have life threatening results.  ____  6.  Bring all medications with you on the day of surgery if instructed.   __x__  7.  Notify your doctor if there is any change in your medical condition      (cold, fever, infections).     Do not wear jewelry, make-up, hairpins, clips or  nail polish. Do not wear lotions, powders, or perfumes.  Do not shave 48 hours prior to surgery. Do not bring valuables to the hospital.    [facility] is not responsible for any belongings or valuables.  Contacts, dentures or bridgework may not be worn into surgery. Leave your suitcase in the car. After surgery it may be brought to your room. For patients admitted to the hospital, discharge time is determined by your treatment team.   Patients discharged the day of surgery will not be allowed to drive home.   Make arrangements  for someone to be with you for the first 24 hours of your Same Day Discharge.    Please read over the following fact sheets that you were given:     __x__ Take these medicines the morning of surgery with A SIP OF WATER:    1. Allergy med and flonase if needed  2.   3.   4.  5.  6.  ____ Fleet Enema (as directed)   __x__ Use CHG Soap (or wipes) as directed  ____ Use Benzoyl Peroxide Gel as instructed  ____ Use inhalers on the day of surgery  ____ Stop metformin 2 days prior to surgery    ____ Take 1/2 of usual insulin dose the night before surgery. No insulin the morning          of surgery.   ____ Stop Coumadin/Plavix/aspirin on   __x__ Stop Anti-inflammatories no ibuprofen aleve or aspirin (intolerant)   May take tylenol   ____ Stop supplements until after surgery.    ____ Bring C-Pap to the hospital.   Ice Pack:  1 part rubbing alcohol 2 parts water in snack ziplock bag.  Put in freezer.  Wrap in cloth and apply to operative area for comfort.                   Use small pillow under your arm to reduce pressure Bring a comfortable bra with no underwire to wear to support your breast. Obtain stool softener for use after surgery as needed for constipation.

## 2019-06-27 ENCOUNTER — Other Ambulatory Visit: Payer: PRIVATE HEALTH INSURANCE

## 2019-06-27 ENCOUNTER — Encounter
Admission: RE | Admit: 2019-06-27 | Discharge: 2019-06-27 | Disposition: A | Discharge status: Home / Self Care | Payer: Medicare Other | Source: Ambulatory Visit | Attending: General Surgery | Admitting: General Surgery

## 2019-06-27 DIAGNOSIS — Z20822 Contact with and (suspected) exposure to covid-19: Secondary | ICD-10-CM | POA: Diagnosis not present

## 2019-06-27 DIAGNOSIS — Z01818 Encounter for other preprocedural examination: Secondary | ICD-10-CM | POA: Insufficient documentation

## 2019-06-27 LAB — SARS CORONAVIRUS 2 (TAT 6-24 HRS): SARS Coronavirus 2: NEGATIVE

## 2019-06-28 ENCOUNTER — Inpatient Hospital Stay: Payer: Medicare Other | Attending: Oncology | Admitting: Licensed Clinical Social Worker

## 2019-06-28 ENCOUNTER — Encounter: Payer: Self-pay | Admitting: Licensed Clinical Social Worker

## 2019-06-28 ENCOUNTER — Other Ambulatory Visit: Payer: Self-pay

## 2019-06-28 ENCOUNTER — Inpatient Hospital Stay: Payer: Medicare Other

## 2019-06-28 DIAGNOSIS — Z803 Family history of malignant neoplasm of breast: Secondary | ICD-10-CM | POA: Insufficient documentation

## 2019-06-28 DIAGNOSIS — C50919 Malignant neoplasm of unspecified site of unspecified female breast: Secondary | ICD-10-CM

## 2019-06-28 DIAGNOSIS — Z170 Estrogen receptor positive status [ER+]: Secondary | ICD-10-CM | POA: Insufficient documentation

## 2019-06-28 DIAGNOSIS — Z1379 Encounter for other screening for genetic and chromosomal anomalies: Secondary | ICD-10-CM

## 2019-06-28 DIAGNOSIS — Z87891 Personal history of nicotine dependence: Secondary | ICD-10-CM | POA: Insufficient documentation

## 2019-06-28 DIAGNOSIS — Z801 Family history of malignant neoplasm of trachea, bronchus and lung: Secondary | ICD-10-CM

## 2019-06-28 DIAGNOSIS — Z8249 Family history of ischemic heart disease and other diseases of the circulatory system: Secondary | ICD-10-CM | POA: Insufficient documentation

## 2019-06-28 DIAGNOSIS — C50912 Malignant neoplasm of unspecified site of left female breast: Secondary | ICD-10-CM | POA: Insufficient documentation

## 2019-06-28 NOTE — Progress Notes (Signed)
REFERRING PROVIDER: Sindy Guadeloupe, MD [place],   [id number]  PRIMARY PROVIDER:  Jerrol Banana., MD  PRIMARY REASON FOR VISIT:  1. Invasive carcinoma of breast ([place])   2. Family history of breast cancer   3. Family history of lung cancer      HISTORY OF PRESENT ILLNESS:   Elizabeth Morgan, a 69 y.o. female, was seen for a [place] cancer genetics consultation at the request of Dr. Janese Banks due to a personal and family history of cancer.  Elizabeth Morgan presents to clinic today to discuss the possibility of a hereditary predisposition to cancer, genetic testing, and to further clarify her future cancer risks, as well as potential cancer risks for family members.   In 2021, at the age of 4, Elizabeth Morgan was diagnosed with invasive mammary carcinoma of the left breast, ER/PR+, Her2-. The treatment plan includes lumpectomy which is scheduled for tomorrow, 6/11, and likely radiation.   CANCER HISTORY:  Oncology History   No history exists.     RISK FACTORS:  Menarche was at age 27.  First live birth at age 67.  OCP use for approximately 3 years.  Ovaries intact: yes.  Hysterectomy: no.  Menopausal status: postmenopausal.  HRT use: 3 months Colonoscopy: yes; she reports polyps, believes < 10 cumulative, cscope every 5 years. Mammogram within the last year: yes. Number of breast biopsies: 1. Up to date with pelvic exams: yes. Any excessive radiation exposure in the past: no  Past Medical History:  Diagnosis Date  . Allergy   . Cancer [facility])    left breast  . Family history of breast cancer   . Family history of lung cancer   . Hearing loss   . Hernia, abdominal    showed up end of April 2018  . History of herpes zoster 11/05/2015  . Seasonal allergies     Past Surgical History:  Procedure Laterality Date  . ARTHRODESIS METATARSALPHALANGEAL JOINT (MTPJ) Left 06/03/2016   Procedure: ARTHRODESIS METATARSALPHALANGEAL JOINT (MTPJ);  Surgeon: Albertine Patricia, DPM;  Location: [place];  Service: Podiatry;  Laterality: Left;  . CESAREAN SECTION    . COLONOSCOPY    . EYE SURGERY Bilateral    X 2  . HAMMER TOE SURGERY Left 06/03/2016   Procedure: HAMMER TOE CORRECTION - 2nd left toe;  Surgeon: Albertine Patricia, DPM;  Location: [place];  Service: Podiatry;  Laterality: Left;  . TUBAL LIGATION    . WEIL OSTEOTOMY Left 06/03/2016   Procedure: WEIL OSTEOTOMY  SECOND TOE;  Surgeon: Albertine Patricia, DPM;  Location: [place];  Service: Podiatry;  Laterality: Left;    Social History   Socioeconomic History  . Marital status: Married    Spouse name: Not on file  . Number of children: 1  . Years of education: Not on file  . Highest education level: Bachelor's degree (e.g., BA, AB, BS)  Occupational History  . Occupation: retired  Tobacco Use  . Smoking status: Former Smoker    Years: 10.00    Quit date: 1998    Years since quitting: 23.4  . Smokeless tobacco: Never Used  . Tobacco comment: Quit about 30 years ago  Vaping Use  . Vaping Use: Never used  Substance and Sexual Activity  . Alcohol use: Yes    Comment: occasionally, 1x/month  . Drug use: No  . Sexual activity: Never  Other Topics Concern  . Not on file  Social History Narrative  .  Not on file   Social Determinants of Health   Financial Resource Strain: Low Risk   . Difficulty of Paying Living Expenses: Not hard at all  Food Insecurity: No Food Insecurity  . Worried About Charity fundraiser in the Last Year: Never true  . Ran Out of Food in the Last Year: Never true  Transportation Needs: No Transportation Needs  . Lack of Transportation (Medical): No  . Lack of Transportation (Non-Medical): No  Physical Activity: Inactive  . Days of Exercise per Week: 0 days  . Minutes of Exercise per Session: 0 min  Stress: No Stress Concern Present  . Feeling of Stress : Not at all  Social Connections: Moderately Integrated  . Frequency of  Communication with Friends and Family: More than three times a week  . Frequency of Social Gatherings with Friends and Family: Never  . Attends Religious Services: More than 4 times per year  . Active Member of Clubs or Organizations: No  . Attends Archivist Meetings: Never  . Marital Status: Married     FAMILY HISTORY:  We obtained a detailed, 4-generation family history.  Significant diagnoses are listed below: Family History  Problem Relation Age of Onset  . Dementia Mother   . Dementia Father   . Hypertension Father   . Heart disease Father   . Lung cancer Father   . Breast cancer Sister 15  . Lung cancer Maternal Aunt   . Lung cancer Maternal Uncle    Elizabeth Morgan has a son, 80, no cancer history. She has a sister who had breast cancer at 61 and died at 43. She has a niece and nephew, and her niece is thinking about having genetic testing.  Elizabeth Morgan's mother died at 21, no cancer history. Patient had 3 maternal aunts and 4 maternal uncles. One aunt and one uncle had lung cancer and died in their 32s, they both had history of smoking. She is unaware of cancers in maternal cousins. Maternal grandmother died at 41, grandfather died in his 83s due to a heart attack.  Elizabeth Morgan's father had lung cancer and dementia, he died at 42. Patient had 5 paternal uncles, 2 paternal aunts, no cancers. No known cancers in cousins. Grandparents passed in their 60s.  Elizabeth. Reisen is unaware of previous family history of genetic testing for hereditary cancer risks. Patient's maternal ancestors are of unknown descent, and paternal ancestors are of [place], [place] descent. There is no reported Ashkenazi Jewish ancestry. There is no known consanguinity.  GENETIC COUNSELING ASSESSMENT: Elizabeth Morgan is a 69 y.o. female with a personal and family history of breast cancer which is somewhat suggestive of a hereditary cancer syndrome and predisposition to cancer. We, therefore, discussed and  recommended the following at today's visit.   DISCUSSION: We discussed that 5 - 10% of breast cancer is hereditary, with most cases associated with BRCA1/BRCA2 mutations.  There are other genes that can be associated with hereditary breast cancer syndromes.  These include ATM, CHEK2, PALB2.   We discussed that testing is beneficial for several reasons including knowing how to follow individuals after completing their treatment, and understand if other family members could be at risk for cancer and allow them to undergo genetic testing.   We reviewed the characteristics, features and inheritance patterns of hereditary cancer syndromes. We also discussed genetic testing, including the appropriate family members to test, the process of testing, insurance coverage and turn-around-time for results. We discussed the implications of  a negative, positive and/or variant of uncertain significant result. We recommended Elizabeth Morgan pursue genetic testing for the [facility] Multi-Cancer gene panel.   The Multi-Cancer Panel offered by [facility] includes sequencing and/or deletion duplication testing of the following 85 genes: AIP, ALK, APC, ATM, AXIN2,BAP1,  BARD1, BLM, BMPR1A, BRCA1, BRCA2, BRIP1, CASR, CDC73, CDH1, CDK4, CDKN1B, CDKN1C, CDKN2A (p14ARF), CDKN2A (p16INK4a), CEBPA, CHEK2, CTNNA1, DICER1, DIS3L2, EGFR (c.2369C>T, p.Thr790Met variant only), EPCAM (Deletion/duplication testing only), FH, FLCN, GATA2, GPC3, GREM1 (Promoter region deletion/duplication testing only), HOXB13 (c.251G>A, p.Gly84Glu), HRAS, KIT, MAX, MEN1, MET, MITF (c.952G>A, p.Glu318Lys variant only), MLH1, MSH2, MSH3, MSH6, MUTYH, NBN, NF1, NF2, NTHL1, PALB2, PDGFRA, PHOX2B, PMS2, POLD1, POLE, POT1, PRKAR1A, PTCH1, PTEN, RAD50, RAD51C, RAD51D, RB1, RECQL4, RET, RNF43, RUNX1, SDHAF2, SDHA (sequence changes only), SDHB, SDHC, SDHD, SMAD4, SMARCA4, SMARCB1, SMARCE1, STK11, SUFU, TERC, TERT, TMEM127, TP53, TSC1, TSC2, VHL, WRN and WT1.   Based on Elizabeth.  Morgan's personal and family history of cancer, she does not quite meet medical criteria for genetic testing. Therefore she will pay the [facility] patient pay price of $250 for this testing.   PLAN: After considering the risks, benefits, and limitations, Elizabeth Morgan provided informed consent to pursue genetic testing and the blood sample was sent to [facility] for analysis of the Multi-Cancer Panel. Results should be available within approximately 2-3 weeks' time, at which point they will be disclosed by telephone to Elizabeth Morgan, as will any additional recommendations warranted by these results. Elizabeth Morgan will receive a summary of her genetic counseling visit and a copy of her results once available. This information will also be available in Epic.   Elizabeth Morgan questions were answered to her satisfaction today. Our contact information was provided should additional questions or concerns arise. Thank you for the referral and allowing [place] to share in the care of your patient.   Faith Rogue, Elizabeth, [facility] Genetic Counselor [email or web address] Phone: [phone]  The patient was seen for a total of 25 minutes in face-to-face genetic counseling.  Dr. Grayland Ormond was available for discussion regarding this case.   _______________________________________________________________________ For Office Staff:  Number of people involved in session: 1 Was an Intern/ student involved with case: no

## 2019-06-29 ENCOUNTER — Encounter
Admission: RE | Admit: 2019-06-29 | Discharge: 2019-06-29 | Disposition: A | Discharge status: Home / Self Care | Payer: Medicare Other | Source: Ambulatory Visit | Attending: General Surgery | Admitting: General Surgery

## 2019-06-29 ENCOUNTER — Ambulatory Visit: Payer: Medicare Other | Admitting: Registered Nurse

## 2019-06-29 ENCOUNTER — Ambulatory Visit
Admission: RE | Admit: 2019-06-29 | Discharge: 2019-06-29 | Disposition: A | Discharge status: Home / Self Care | Payer: Medicare Other | Source: Ambulatory Visit | Attending: General Surgery | Admitting: General Surgery

## 2019-06-29 ENCOUNTER — Encounter: Payer: Self-pay | Admitting: General Surgery

## 2019-06-29 ENCOUNTER — Ambulatory Visit
Admission: RE | Admit: 2019-06-29 | Discharge: 2019-06-29 | Disposition: A | Discharge status: Home / Self Care | Payer: Medicare Other | Attending: General Surgery | Admitting: General Surgery

## 2019-06-29 ENCOUNTER — Encounter
Admission: RE | Disposition: A | Discharge status: Home / Self Care | Payer: Self-pay | Source: Home / Self Care | Attending: General Surgery

## 2019-06-29 ENCOUNTER — Other Ambulatory Visit: Payer: Self-pay

## 2019-06-29 DIAGNOSIS — I10 Essential (primary) hypertension: Secondary | ICD-10-CM | POA: Insufficient documentation

## 2019-06-29 DIAGNOSIS — K219 Gastro-esophageal reflux disease without esophagitis: Secondary | ICD-10-CM | POA: Diagnosis not present

## 2019-06-29 DIAGNOSIS — C50912 Malignant neoplasm of unspecified site of left female breast: Secondary | ICD-10-CM | POA: Insufficient documentation

## 2019-06-29 DIAGNOSIS — Z853 Personal history of malignant neoplasm of breast: Secondary | ICD-10-CM

## 2019-06-29 DIAGNOSIS — Z79899 Other long term (current) drug therapy: Secondary | ICD-10-CM | POA: Diagnosis not present

## 2019-06-29 DIAGNOSIS — Z87891 Personal history of nicotine dependence: Secondary | ICD-10-CM | POA: Diagnosis not present

## 2019-06-29 DIAGNOSIS — D649 Anemia, unspecified: Secondary | ICD-10-CM | POA: Insufficient documentation

## 2019-06-29 DIAGNOSIS — C50412 Malignant neoplasm of upper-outer quadrant of left female breast: Secondary | ICD-10-CM | POA: Diagnosis not present

## 2019-06-29 HISTORY — PX: BREAST LUMPECTOMY: SHX2

## 2019-06-29 HISTORY — PX: BREAST LUMPECTOMY WITH SENTINEL LYMPH NODE BIOPSY: SHX5597

## 2019-06-29 SURGERY — BREAST LUMPECTOMY WITH SENTINEL LYMPH NODE BX
Anesthesia: General | Laterality: Left

## 2019-06-29 MED ORDER — ACETAMINOPHEN 10 MG/ML IV SOLN
INTRAVENOUS | Status: AC
  Filled 2019-06-29: qty 100

## 2019-06-29 MED ORDER — ONDANSETRON HCL 4 MG/2ML IJ SOLN
INTRAMUSCULAR | Status: DC | PRN
  Administered 2019-06-29: 4 mg via INTRAVENOUS

## 2019-06-29 MED ORDER — CEFAZOLIN SODIUM-DEXTROSE 2-4 GM/100ML-% IV SOLN
2.0000 g | INTRAVENOUS | Status: AC
  Administered 2019-06-29: 2 g via INTRAVENOUS

## 2019-06-29 MED ORDER — BUPIVACAINE HCL (PF) 0.5 % IJ SOLN
INTRAMUSCULAR | Status: AC
  Filled 2019-06-29: qty 30

## 2019-06-29 MED ORDER — EPHEDRINE SULFATE 50 MG/ML IJ SOLN
INTRAMUSCULAR | Status: DC | PRN
  Administered 2019-06-29 (×3): 5 mg via INTRAVENOUS

## 2019-06-29 MED ORDER — LACTATED RINGERS IV SOLN: INTRAVENOUS | Status: DC

## 2019-06-29 MED ORDER — OXYCODONE-ACETAMINOPHEN 5-325 MG PO TABS
1.0000 | ORAL_TABLET | ORAL | 0 refills | Status: DC | PRN
Start: 2019-06-29 — End: 2019-07-13

## 2019-06-29 MED ORDER — FAMOTIDINE 20 MG PO TABS: 20.0000 mg | ORAL_TABLET | Freq: Once | ORAL | Status: AC

## 2019-06-29 MED ORDER — FENTANYL CITRATE (PF) 100 MCG/2ML IJ SOLN
INTRAMUSCULAR | Status: DC | PRN
  Administered 2019-06-29 (×8): 25 ug via INTRAVENOUS

## 2019-06-29 MED ORDER — TECHNETIUM TC 99M SULFUR COLLOID FILTERED
0.7860 | Freq: Once | INTRAVENOUS | Status: AC | PRN
  Administered 2019-06-29: 0.786 via INTRADERMAL

## 2019-06-29 MED ORDER — FENTANYL CITRATE (PF) 100 MCG/2ML IJ SOLN
INTRAMUSCULAR | Status: AC
  Administered 2019-06-29: 25 ug via INTRAVENOUS
  Filled 2019-06-29: qty 2

## 2019-06-29 MED ORDER — MIDAZOLAM HCL 2 MG/2ML IJ SOLN
INTRAMUSCULAR | Status: AC
  Filled 2019-06-29: qty 2

## 2019-06-29 MED ORDER — EPINEPHRINE PF 1 MG/ML IJ SOLN
INTRAMUSCULAR | Status: AC
  Filled 2019-06-29: qty 1

## 2019-06-29 MED ORDER — METHYLENE BLUE 0.5 % INJ SOLN
INTRAVENOUS | Status: DC | PRN
  Administered 2019-06-29: 2 mL via SUBMUCOSAL

## 2019-06-29 MED ORDER — DEXMEDETOMIDINE HCL IN NACL 200 MCG/50ML IV SOLN
INTRAVENOUS | Status: DC | PRN
Start: 2019-06-29 — End: 2019-06-29
  Administered 2019-06-29 (×2): 12 ug via INTRAVENOUS

## 2019-06-29 MED ORDER — KETOROLAC TROMETHAMINE 30 MG/ML IJ SOLN
INTRAMUSCULAR | Status: AC
  Filled 2019-06-29: qty 1

## 2019-06-29 MED ORDER — ORAL CARE MOUTH RINSE: 15.0000 mL | Freq: Once | OROMUCOSAL | Status: AC

## 2019-06-29 MED ORDER — CEFAZOLIN SODIUM-DEXTROSE 2-4 GM/100ML-% IV SOLN
INTRAVENOUS | Status: AC
  Filled 2019-06-29: qty 100

## 2019-06-29 MED ORDER — CHLORHEXIDINE GLUCONATE 0.12 % MT SOLN
OROMUCOSAL | Status: AC
  Administered 2019-06-29: 15 mL via OROMUCOSAL
  Filled 2019-06-29: qty 15

## 2019-06-29 MED ORDER — FENTANYL CITRATE (PF) 100 MCG/2ML IJ SOLN
INTRAMUSCULAR | Status: AC
  Filled 2019-06-29: qty 2

## 2019-06-29 MED ORDER — LIDOCAINE HCL (PF) 2 % IJ SOLN
INTRAMUSCULAR | Status: AC
  Filled 2019-06-29: qty 5

## 2019-06-29 MED ORDER — LIDOCAINE HCL (CARDIAC) PF 100 MG/5ML IV SOSY
PREFILLED_SYRINGE | INTRAVENOUS | Status: DC | PRN
  Administered 2019-06-29: 50 mg via INTRAVENOUS

## 2019-06-29 MED ORDER — MIDAZOLAM HCL 2 MG/2ML IJ SOLN
INTRAMUSCULAR | Status: DC | PRN
  Administered 2019-06-29: 1 mg via INTRAVENOUS

## 2019-06-29 MED ORDER — CHLORHEXIDINE GLUCONATE 0.12 % MT SOLN: 15.0000 mL | Freq: Once | OROMUCOSAL | Status: AC

## 2019-06-29 MED ORDER — EPHEDRINE 5 MG/ML INJ
INTRAVENOUS | Status: AC
  Filled 2019-06-29: qty 10

## 2019-06-29 MED ORDER — ONDANSETRON HCL 4 MG/2ML IJ SOLN
INTRAMUSCULAR | Status: AC
  Filled 2019-06-29: qty 2

## 2019-06-29 MED ORDER — ONDANSETRON HCL 4 MG/2ML IJ SOLN: 4.0000 mg | Freq: Once | INTRAMUSCULAR | Status: DC | PRN

## 2019-06-29 MED ORDER — PROPOFOL 10 MG/ML IV BOLUS
INTRAVENOUS | Status: DC | PRN
  Administered 2019-06-29: 150 mg via INTRAVENOUS

## 2019-06-29 MED ORDER — GLYCOPYRROLATE 0.2 MG/ML IJ SOLN
INTRAMUSCULAR | Status: DC | PRN
  Administered 2019-06-29: .2 mg via INTRAVENOUS

## 2019-06-29 MED ORDER — METHYLENE BLUE 0.5 % INJ SOLN
INTRAVENOUS | Status: AC
  Filled 2019-06-29: qty 10

## 2019-06-29 MED ORDER — ONDANSETRON HCL 4 MG PO TABS: 4.0000 mg | ORAL_TABLET | ORAL | 1 refills | Status: DC | PRN

## 2019-06-29 MED ORDER — FAMOTIDINE 20 MG PO TABS
ORAL_TABLET | ORAL | Status: AC
  Administered 2019-06-29: 20 mg via ORAL
  Filled 2019-06-29: qty 1

## 2019-06-29 MED ORDER — ACETAMINOPHEN 10 MG/ML IV SOLN
INTRAVENOUS | Status: DC | PRN
  Administered 2019-06-29: 1000 mg via INTRAVENOUS

## 2019-06-29 MED ORDER — BUPIVACAINE-EPINEPHRINE (PF) 0.5% -1:200000 IJ SOLN
INTRAMUSCULAR | Status: DC | PRN
  Administered 2019-06-29: 30 mL via PERINEURAL

## 2019-06-29 MED ORDER — FENTANYL CITRATE (PF) 100 MCG/2ML IJ SOLN
25.0000 ug | INTRAMUSCULAR | Status: DC | PRN
  Administered 2019-06-29 (×2): 25 ug via INTRAVENOUS

## 2019-06-29 MED ORDER — GLYCOPYRROLATE 0.2 MG/ML IJ SOLN
INTRAMUSCULAR | Status: AC
  Filled 2019-06-29: qty 1

## 2019-06-29 MED ORDER — KETOROLAC TROMETHAMINE 30 MG/ML IJ SOLN
INTRAMUSCULAR | Status: DC | PRN
  Administered 2019-06-29: 30 mg via INTRAVENOUS

## 2019-06-29 MED ORDER — PROPOFOL 10 MG/ML IV BOLUS
INTRAVENOUS | Status: AC
  Filled 2019-06-29: qty 20

## 2019-06-29 MED ORDER — DEXAMETHASONE SODIUM PHOSPHATE 10 MG/ML IJ SOLN
INTRAMUSCULAR | Status: DC | PRN
  Administered 2019-06-29: 10 mg via INTRAVENOUS

## 2019-06-29 MED ORDER — DEXAMETHASONE SODIUM PHOSPHATE 10 MG/ML IJ SOLN
INTRAMUSCULAR | Status: AC
  Filled 2019-06-29: qty 1

## 2019-06-29 MED ORDER — PHENYLEPHRINE HCL (PRESSORS) 10 MG/ML IV SOLN
INTRAVENOUS | Status: AC
  Filled 2019-06-29: qty 1

## 2019-06-29 SURGICAL SUPPLY — 60 items
APL PRP STRL LF DISP 70% ISPRP (MISCELLANEOUS) ×1
BINDER BREAST LRG (GAUZE/BANDAGES/DRESSINGS) IMPLANT
BINDER BREAST MEDIUM (GAUZE/BANDAGES/DRESSINGS) IMPLANT
BINDER BREAST XLRG (GAUZE/BANDAGES/DRESSINGS) IMPLANT
BINDER BREAST XXLRG (GAUZE/BANDAGES/DRESSINGS) IMPLANT
BLADE BOVIE TIP EXT 4 (BLADE) IMPLANT
BLADE SURG 15 STRL SS SAFETY (BLADE) ×4 IMPLANT
BULB RESERV EVAC DRAIN JP 100C (MISCELLANEOUS) IMPLANT
CANISTER SUCT 1200ML W/VALVE (MISCELLANEOUS) ×2 IMPLANT
CHLORAPREP W/TINT 26 (MISCELLANEOUS) ×2 IMPLANT
CNTNR SPEC 2.5X3XGRAD LEK (MISCELLANEOUS)
CONT SPEC 4OZ STER OR WHT (MISCELLANEOUS)
CONT SPEC 4OZ STRL OR WHT (MISCELLANEOUS)
CONTAINER SPEC 2.5X3XGRAD LEK (MISCELLANEOUS) IMPLANT
COVER PROBE FLX POLY STRL (MISCELLANEOUS) ×2 IMPLANT
COVER WAND RF STERILE (DRAPES) ×2 IMPLANT
DEVICE DUBIN SPECIMEN MAMMOGRA (MISCELLANEOUS) ×2 IMPLANT
DRAIN CHANNEL JP 15F RND 16 (MISCELLANEOUS) IMPLANT
DRAPE LAPAROTOMY TRNSV 106X77 (MISCELLANEOUS) ×2 IMPLANT
DRSG GAUZE FLUFF 36X18 (GAUZE/BANDAGES/DRESSINGS) ×4 IMPLANT
DRSG TELFA 3X8 NADH (GAUZE/BANDAGES/DRESSINGS) ×2 IMPLANT
ELECT CAUTERY BLADE TIP 2.5 (TIP) ×2
ELECT REM PT RETURN 9FT ADLT (ELECTROSURGICAL) ×2
ELECTRODE CAUTERY BLDE TIP 2.5 (TIP) ×1 IMPLANT
ELECTRODE REM PT RTRN 9FT ADLT (ELECTROSURGICAL) ×1 IMPLANT
GAUZE SPONGE 4X4 12PLY STRL (GAUZE/BANDAGES/DRESSINGS) ×2 IMPLANT
GLOVE BIO SURGEON STRL SZ7.5 (GLOVE) ×4 IMPLANT
GLOVE INDICATOR 8.0 STRL GRN (GLOVE) ×4 IMPLANT
GOWN STRL REUS W/ TWL LRG LVL3 (GOWN DISPOSABLE) ×2 IMPLANT
GOWN STRL REUS W/TWL LRG LVL3 (GOWN DISPOSABLE) ×6
KIT MARKER MARGIN INK (KITS) ×1 IMPLANT
KIT TURNOVER KIT A (KITS) ×2 IMPLANT
LABEL OR SOLS (LABEL) ×2 IMPLANT
MARGIN MAP 10MM (MISCELLANEOUS) ×2 IMPLANT
NDL HYPO 25X1 1.5 SAFETY (NEEDLE) ×2 IMPLANT
NEEDLE HYPO 22GX1.5 SAFETY (NEEDLE) ×2 IMPLANT
NEEDLE HYPO 25X1 1.5 SAFETY (NEEDLE) ×4 IMPLANT
PACK BASIN MINOR (MISCELLANEOUS) ×2 IMPLANT
PAD DRESSING TELFA 3X8 NADH (GAUZE/BANDAGES/DRESSINGS) ×1 IMPLANT
RETRACTOR RING XSMALL (MISCELLANEOUS) IMPLANT
RTRCTR WOUND ALEXIS 13CM XS SH (MISCELLANEOUS) ×2
SHEARS FOC LG CVD HARMONIC 17C (MISCELLANEOUS) IMPLANT
SHEARS HARMONIC 9CM CVD (BLADE) IMPLANT
SLEVE PROBE SENORX GAMMA FIND (MISCELLANEOUS) ×1 IMPLANT
STRIP CLOSURE SKIN 1/2X4 (GAUZE/BANDAGES/DRESSINGS) ×2 IMPLANT
SUT ETHILON 3-0 FS-10 30 BLK (SUTURE) ×2
SUT SILK 2 0 (SUTURE) ×2
SUT SILK 2-0 18XBRD TIE 12 (SUTURE) ×1 IMPLANT
SUT VIC AB 2-0 CT1 27 (SUTURE) ×6
SUT VIC AB 2-0 CT1 TAPERPNT 27 (SUTURE) ×3 IMPLANT
SUT VIC AB 3-0 SH 27 (SUTURE) ×4
SUT VIC AB 3-0 SH 27X BRD (SUTURE) ×2 IMPLANT
SUT VIC AB 4-0 FS2 27 (SUTURE) ×4 IMPLANT
SUT VICRYL+ 3-0 144IN (SUTURE) ×2 IMPLANT
SUTURE EHLN 3-0 FS-10 30 BLK (SUTURE) ×1 IMPLANT
SWABSTK COMLB BENZOIN TINCTURE (MISCELLANEOUS) ×2 IMPLANT
SYR 10ML LL (SYRINGE) ×2 IMPLANT
SYR BULB IRRIG 60ML STRL (SYRINGE) ×2 IMPLANT
TAPE TRANSPORE STRL 2 31045 (GAUZE/BANDAGES/DRESSINGS) ×2 IMPLANT
WATER STERILE IRR 1000ML POUR (IV SOLUTION) ×2 IMPLANT

## 2019-06-29 NOTE — H&P (Signed)
Elizabeth Morgan [id number] September 13, 1950     HPI:  69 y.o woman with recently diagnosed left breast cancer. She desires breast conservation. Tolerated SLN injection well with pre-procedure EMLA cream.   Medications Prior to Admission  Medication Sig Dispense Refill Last Dose  . cetirizine (ZYRTEC ALLERGY) 10 MG tablet Take 10 mg by mouth daily as needed (allergies).    06/28/2019 at Unknown time  . fexofenadine (ALLEGRA) 180 MG tablet Take 180 mg by mouth daily.   Past Week at Unknown time  . fluticasone (FLONASE) 50 MCG/ACT nasal spray Place 1 spray into both nostrils daily as needed for allergies or rhinitis.   Past Week at Unknown time   Allergies  Allergen Reactions  . Aleve [Naproxen Sodium] Other (See Comments)    BURN STOMACH  . Aspirin Other (See Comments)    BURN STOMACH  . Chicken Protein     Dizziness  . Codeine Nausea And Vomiting  . Naproxen Other (See Comments)    Stomach burns    Past Medical History:  Diagnosis Date  . Allergy   . Cancer [facility])    left breast  . Family history of breast cancer   . Family history of lung cancer   . Hearing loss   . Hernia, abdominal    showed up end of April 2018  . History of herpes zoster 11/05/2015  . Seasonal allergies    Past Surgical History:  Procedure Laterality Date  . ARTHRODESIS METATARSALPHALANGEAL JOINT (MTPJ) Left 06/03/2016   Procedure: ARTHRODESIS METATARSALPHALANGEAL JOINT (MTPJ);  Surgeon: Albertine Patricia, DPM;  Location: [place];  Service: Podiatry;  Laterality: Left;  . CESAREAN SECTION    . COLONOSCOPY    . EYE SURGERY Bilateral    X 2  . HAMMER TOE SURGERY Left 06/03/2016   Procedure: HAMMER TOE CORRECTION - 2nd left toe;  Surgeon: Albertine Patricia, DPM;  Location: [place];  Service: Podiatry;  Laterality: Left;  . TUBAL LIGATION    . WEIL OSTEOTOMY Left 06/03/2016   Procedure: WEIL OSTEOTOMY  SECOND TOE;  Surgeon: Albertine Patricia, DPM;  Location: [place];  Service:  Podiatry;  Laterality: Left;   Social History   Socioeconomic History  . Marital status: Married    Spouse name: Not on file  . Number of children: 1  . Years of education: Not on file  . Highest education level: Bachelor's degree (e.g., BA, AB, BS)  Occupational History  . Occupation: retired  Tobacco Use  . Smoking status: Former Smoker    Years: 10.00    Quit date: 1998    Years since quitting: 23.4  . Smokeless tobacco: Never Used  . Tobacco comment: Quit about 30 years ago  Vaping Use  . Vaping Use: Never used  Substance and Sexual Activity  . Alcohol use: Yes    Comment: occasionally, 1x/month  . Drug use: No  . Sexual activity: Never  Other Topics Concern  . Not on file  Social History Narrative  . Not on file   Social Determinants of Health   Financial Resource Strain: Low Risk   . Difficulty of Paying Living Expenses: Not hard at all  Food Insecurity: No Food Insecurity  . Worried About Charity fundraiser in the Last Year: Never true  . Ran Out of Food in the Last Year: Never true  Transportation Needs: No Transportation Needs  . Lack of Transportation (Medical): No  . Lack of Transportation (Non-Medical): No  Physical Activity:  Inactive  . Days of Exercise per Week: 0 days  . Minutes of Exercise per Session: 0 min  Stress: No Stress Concern Present  . Feeling of Stress : Not at all  Social Connections: Moderately Integrated  . Frequency of Communication with Friends and Family: More than three times a week  . Frequency of Social Gatherings with Friends and Family: Never  . Attends Religious Services: More than 4 times per year  . Active Member of Clubs or Organizations: No  . Attends Archivist Meetings: Never  . Marital Status: Married  Human resources officer Violence: Not At Risk  . Fear of Current or Ex-Partner: No  . Emotionally Abused: No  . Physically Abused: No  . Sexually Abused: No   Social History   Social History Narrative  . Not  on file     ROS: Negative.     PE: HEENT: Negative. Lungs: Clear. Cardio: RR.    Assessment/Plan:  Proceed with planned left breast wide excision and SLN biopsy.    Forest Gleason [facility] 06/29/2019

## 2019-06-29 NOTE — Anesthesia Procedure Notes (Signed)
Procedure Name: LMA Insertion Date/Time: 06/29/2019 12:43 PM Performed by: Nolon Lennert, RN Pre-anesthesia Checklist: Patient identified, Patient being monitored, Timeout performed, Emergency Drugs available and Suction available Patient Re-evaluated:Patient Re-evaluated prior to induction Oxygen Delivery Method: Circle system utilized Preoxygenation: Pre-oxygenation with 100% oxygen Induction Type: IV induction Ventilation: Mask ventilation without difficulty LMA: LMA inserted LMA Size: 4.0 Tube type: Oral Number of attempts: 1 Placement Confirmation: positive ETCO2 and breath sounds checked- equal and bilateral Tube secured with: Tape Dental Injury: Teeth and Oropharynx as per pre-operative assessment

## 2019-06-29 NOTE — Op Note (Signed)
Preoperative diagnosis: Left breast cancer, desire for breast conservation.  Postoperative diagnosis: Same.  Operative procedure: Ultrasound-guided left breast wide excision with donut mastopexy, sentinel node biopsy.  Operating surgeon: Hervey Ard, MD.  Anesthesia: General by LMA, Marcaine 0.5% with 1: 200,000 units of epinephrine, 30 cc.  Estimated blood loss: 30 cc.  Clinical note: This 69 year old woman was identified with a 2.5 cm mass in the near retroareolar area of the left breast.  She desired breast conservation.  She was injected with technetium sulfur colloid the morning of the procedure.  SCD stockings for DVT prevention.  Ancef was administered on admission to the operating theater.  Operative note: Scanning the axilla with the node seeker showed low counts of 40-50.  For that reason 2 cc of 0.5% methylene blue was injected in the subareolar plexus on the inferior aspect of the areola after skin cleansing with alcohol.  Local anesthetic was then infiltrated well away from the planned surgical incisions for postoperative analgesia and hemostasis.  It was elected to make use of a donut mastopexy incision.  The patient has a fairly small areola and a second incision 1 cm outside this was made.  The skin was de-epithelialized.  Ultrasound was used to confirm location of the mass.  This extended almost up to the retroareolar tissue.  The dermis was divided along the edge of the areola from the 9 to 3 o'clock position.  This was then deepened into the subcutaneous fat circumferentially avoiding the inferior pole of the areola and nipple.  The tumor appeared to come close to the anterior margin on clinical exam.  A 5 x 4 x 5 cm block of tissue was excised, inked and orientated.  Specimen radiograph confirmed the previously placed coil clip was present.  There were noted to be strands of fibrous tissue coming out from the area of the retroareolar space, it was unclear whether these were  simple fibrous bands or related to tumor.  This resulted in the somewhat larger volume of tissue being resected towards the periphery of the breast.  While the breast specimen was being examined by pathology attention was turned to the axilla.  After the additional hour there were counts of up to 100 in the lower aspect of the axilla.  Local anesthesia was infiltrated and a transverse incision was made.  The skin was divided sharply remaining dissection completed with electrocautery.  Just above the level of the axillary envelope a extra small Alexis wound protector was placed.  The axillary envelope was opened and a small 7 mm unremarkable lymph node without methylene blue staining was identified.  Counts of 400.  After this was removed no definitive counts to suggest additional lymphadenopathy were identified.  Palpation showed no abnormality.  The wound was closed with interrupted 2-0 Vicryl figure-of-eight sutures to the axillary envelope and then to the subcutaneous fat.  The skin was closed with a running 4-0 Vicryl subcuticular suture.  The pathologist reported the anterior margin was indeed grossly positive.  It was elected to resect the skin and underlying subcutaneous fat in the 1 o'clock position which corresponded to the area of clinical concern.  This ellipse was approximately 4-5 cm in length and 3 cm in width.  This was tacked to a Telfa pad and oriented.  It was was sent in formalin for routine histology.  Attention was then turned to closing the generous defect.  The skin and subcutaneous fat was elevated off the underlying breast tissue as with a  mastectomy, approximately 5 mm in thickness.  This was done circumferentially from the 9 to 3 o'clock position.  The breast was then mobilized off the underlying pectoralis fascia.  The lateral aspect of the tissue was then tacked to the most medial aspect of the pectoralis fashion to help reconstruct the breast volume.  The superficial layer of the  medial tissue was brought over the lateral tissue to complete the volume fill.  The ellipse resected to clear the anterior margin was approximated with interrupted 2-0 Vicryl sutures and then a running 4-0 Vicryl subcuticular suture.  The mastopexy was closed with a 3-0 Gore-Tex wagon wheel suture.  Final apposition of the dermis closed with interrupted 4-0 Vicryl subcuticular sutures.  Benzoin Steri-Strips were applied followed by Telfa, fluff gauze and a compressive wrap.  The patient tolerated the procedure well and was taken to the recovery room in stable condition.

## 2019-06-29 NOTE — Transfer of Care (Signed)
Immediate Anesthesia Transfer of Care Note  Patient: GRETA YUNG  Procedure(s) Performed: BREAST LUMPECTOMY WITH SENTINEL LYMPH NODE BX (Left )  Patient Location: PACU  Anesthesia Type:General  Level of Consciousness: sedated  Airway & Oxygen Therapy: Patient Spontanous Breathing and Patient connected to face mask oxygen  Post-op Assessment: Report given to RN and Post -op Vital signs reviewed and stable  Post vital signs: Reviewed  Last Vitals:  Vitals Value Taken Time  BP 110/53 06/29/19 1530  Temp    Pulse 86 06/29/19 1530  Resp 14 06/29/19 1530  SpO2 99 % 06/29/19 1530  Vitals shown include unvalidated device data.  Last Pain:  Vitals:   06/29/19 1206  TempSrc: Tympanic  PainSc: 0-No pain         Complications: No complications documented.

## 2019-06-29 NOTE — Anesthesia Preprocedure Evaluation (Signed)
Anesthesia Evaluation  Patient identified by MRN, date of birth, ID band Patient awake    Reviewed: Allergy & Precautions, H&P , NPO status , Patient's Chart, lab work & pertinent test results, reviewed documented beta blocker date and time   Airway Mallampati: II  TM Distance: >3 FB Neck ROM: full    Dental  (+) Teeth Intact   Pulmonary neg pulmonary ROS, former smoker,    Pulmonary exam normal        Cardiovascular Exercise Tolerance: Good hypertension, On Medications negative cardio ROS Normal cardiovascular exam Rate:Normal     Neuro/Psych  Headaches, negative neurological ROS  negative psych ROS   GI/Hepatic negative GI ROS, Neg liver ROS, GERD  Medicated,  Endo/Other  negative endocrine ROS  Renal/GU negative Renal ROS  negative genitourinary   Musculoskeletal   Abdominal   Peds  Hematology negative hematology ROS (+) Blood dyscrasia, anemia ,   Anesthesia Other Findings   Reproductive/Obstetrics negative OB ROS                             Anesthesia Physical Anesthesia Plan  ASA: II  Anesthesia Plan: General LMA   Post-op Pain Management:    Induction:   PONV Risk Score and Plan: 4 or greater  Airway Management Planned:   Additional Equipment:   Intra-op Plan:   Post-operative Plan:   Informed Consent: I have reviewed the patients History and Physical, chart, labs and discussed the procedure including the risks, benefits and alternatives for the proposed anesthesia with the patient or authorized representative who has indicated his/her understanding and acceptance.       Plan Discussed with: CRNA  Anesthesia Plan Comments:         Anesthesia Quick Evaluation

## 2019-06-29 NOTE — Discharge Instructions (Signed)

## 2019-06-29 NOTE — Anesthesia Postprocedure Evaluation (Signed)
Anesthesia Post Note  Patient: Elizabeth Morgan  Procedure(s) Performed: BREAST LUMPECTOMY WITH SENTINEL LYMPH NODE BX (Left )  Patient location during evaluation: PACU Anesthesia Type: General Level of consciousness: awake and alert Pain management: pain level controlled Vital Signs Assessment: post-procedure vital signs reviewed and stable Respiratory status: spontaneous breathing and respiratory function stable Cardiovascular status: stable Anesthetic complications: no   No complications documented.   Last Vitals:  Vitals:   06/29/19 1600 06/29/19 1614  BP:  121/60  Pulse: 85 86  Resp: 18 19  Temp:  37 C  SpO2: 96% 94%    Last Pain:  Vitals:   06/29/19 1600  TempSrc:   PainSc: 3                  Arora Coakley K

## 2019-06-30 ENCOUNTER — Encounter: Payer: Self-pay | Admitting: General Surgery

## 2019-07-05 ENCOUNTER — Telehealth: Payer: Self-pay | Admitting: Licensed Clinical Social Worker

## 2019-07-05 ENCOUNTER — Ambulatory Visit: Payer: Self-pay | Admitting: Licensed Clinical Social Worker

## 2019-07-05 DIAGNOSIS — Z801 Family history of malignant neoplasm of trachea, bronchus and lung: Secondary | ICD-10-CM

## 2019-07-05 DIAGNOSIS — Z1379 Encounter for other screening for genetic and chromosomal anomalies: Secondary | ICD-10-CM | POA: Insufficient documentation

## 2019-07-05 DIAGNOSIS — C50919 Malignant neoplasm of unspecified site of unspecified female breast: Secondary | ICD-10-CM

## 2019-07-05 DIAGNOSIS — Z803 Family history of malignant neoplasm of breast: Secondary | ICD-10-CM

## 2019-07-05 NOTE — Telephone Encounter (Addendum)
Revealed negative genetic testing.  Revealed that a VUS in BRIP1 and RAD50 were identified. This normal result is reassuring and indicates that it is unlikely Elizabeth Morgan's cancer is due to a hereditary cause.  It is unlikely that there is an increased risk of another cancer due to a mutation in one of these genes.  However, genetic testing is not perfect, and cannot definitively rule out a hereditary cause.  It will be important for her to keep in contact with genetics to learn if any additional testing may be needed in the future.

## 2019-07-05 NOTE — Progress Notes (Signed)
HPI: Ms. Pilger was previously seen in the [facility] due to a personal and family history of cancer and concerns regarding a hereditary predisposition to cancer. Please refer to our prior cancer genetics clinic note for more information regarding our discussion, assessment and recommendations, at the time. Ms. Robertshaw recent genetic test results were disclosed to her, as were recommendations warranted by these results. These results and recommendations are discussed in more detail below.  CANCER HISTORY:     Oncology History   No history exists.    FAMILY HISTORY:  We obtained a detailed, 4-generation family history.  Significant diagnoses are listed below:      Family History  Problem Relation Age of Onset  . Dementia Mother   . Dementia Father   . Hypertension Father   . Heart disease Father   . Lung cancer Father   . Breast cancer Sister 67  . Lung cancer Maternal Aunt   . Lung cancer Maternal Uncle    Ms. Bott has a son, 57, no cancer history. She has a sister who had breast cancer at 73 and died at 23. She has a niece and nephew, and her niece is thinking about having genetic testing.  Ms. Turcott's mother died at 58, no cancer history. Patient had 3 maternal aunts and 4 maternal uncles. One aunt and one uncle had lung cancer and died in their 44s, they both had history of smoking. She is unaware of cancers in maternal cousins. Maternal grandmother died at 2, grandfather died in his 45s due to a heart attack.  Ms Hanratty's father had lung cancer and dementia, he died at 86. Patient had 5 paternal uncles, 2 paternal aunts, no cancers. No known cancers in cousins. Grandparents passed in their 52s.  Ms.McKenzieis unawareof previous family history of genetic testing for hereditary cancer risks. Patient's maternal ancestors are of unknowndescent, and paternal ancestors are of [place], Stage manager. There is noreported Ashkenazi  Jewish ancestry. There is noknown consanguinity.   GENETIC TEST RESULTS: Genetic testing reported out on 07/05/2019 through the [facility] Multi-cancer panel found no pathogenic mutations.   The Multi-Cancer Panel offered by [facility] includes sequencing and/or deletion duplication testing of the following 85 genes: AIP, ALK, APC, ATM, AXIN2,BAP1,  BARD1, BLM, BMPR1A, BRCA1, BRCA2, BRIP1, CASR, CDC73, CDH1, CDK4, CDKN1B, CDKN1C, CDKN2A (p14ARF), CDKN2A (p16INK4a), CEBPA, CHEK2, CTNNA1, DICER1, DIS3L2, EGFR (c.2369C>T, p.Thr790Met variant only), EPCAM (Deletion/duplication testing only), FH, FLCN, GATA2, GPC3, GREM1 (Promoter region deletion/duplication testing only), HOXB13 (c.251G>A, p.Gly84Glu), HRAS, KIT, MAX, MEN1, MET, MITF (c.952G>A, p.Glu318Lys variant only), MLH1, MSH2, MSH3, MSH6, MUTYH, NBN, NF1, NF2, NTHL1, PALB2, PDGFRA, PHOX2B, PMS2, POLD1, POLE, POT1, PRKAR1A, PTCH1, PTEN, RAD50, RAD51C, RAD51D, RB1, RECQL4, RET, RNF43, RUNX1, SDHAF2, SDHA (sequence changes only), SDHB, SDHC, SDHD, SMAD4, SMARCA4, SMARCB1, SMARCE1, STK11, SUFU, TERC, TERT, TMEM127, TP53, TSC1, TSC2, VHL, WRN and WT1.   The test report has been scanned into EPIC and is located under the Molecular Pathology section of the Results Review tab. A portion of the result report is included below for reference.     We discussed with Ms. Brockman that because current genetic testing is not perfect, it is possible there may be a gene mutation in one of these genes that current testing cannot detect, but that chance is small. We also discussed, that there could be another gene that has not yet been discovered, or that we have not yet tested, that is responsible for the cancer diagnoses in the family. It is  also possible there is a hereditary cause for the cancer in the family that Ms. Rufo did not inherit and therefore was not identified in her testing.  Therefore, it is important to remain in touch with cancer genetics in the  future so that we can continue to offer Ms. Mandile the most up to date genetic testing.   Genetic testing did identify 2 Variants of uncertain significance (VUS) - one in the BRIP1 gene called c.1771T>A, a second in the RAD50 gene called c.973T>C.  At this time, it is unknown if these variants are associated with increased cancer risk or if they are normal findings, but most variants such as these get reclassified to being inconsequential. They should not be used to make medical management decisions.With time, we suspect the lab will determine the significance of these variants, if any. If we do learn more about them, we will try to contact Ms. McKenzieto discuss it further. However, it is important to stay in touch with [place] periodically and keep the address and phone number up to date.  ADDITIONAL GENETIC TESTING: We discussed with Ms. Scurlock that her genetic testing was fairly extensive. If there are genes identified to increase cancer risk that can be analyzed in the future, we would be happy to discuss and coordinate this testing at that time.   CANCER SCREENING RECOMMENDATIONS: Ms. Brazzel's test result is considered negative (normal).  This means that we have not identified a hereditary cause for her  personal and family history of cancer at this time. Most cancers happen by chance and this negative test suggests that her cancer may fall into this category.    While reassuring, this does not definitively rule out a hereditary predisposition to cancer. It is still possible that there could be genetic mutations that are undetectable by current technology. There could be genetic mutations in genes that have not been tested or identified to increase cancer risk.  Therefore, it is recommended she continue to follow the cancer management and screening guidelines provided by her oncology and primary healthcare provider.   An individual's cancer risk and medical management are not determined by  genetic test results alone. Overall cancer risk assessment incorporates additional factors, including personal medical history, family history, and any available genetic information that may result in a personalized plan for cancer prevention and surveillance.  RECOMMENDATIONS FOR FAMILY MEMBERS: Relatives in this family might be at some increased risk of developing cancer, over the general population risk, simply due to the family history of cancer. We recommended female relatives in this family have a yearly mammogram beginning at age 8, or 63 years younger than the earliest onset of cancer, an annual clinical breast exam, and perform monthly breast self-exams. Female relatives in this family should also have a gynecological exam as recommended by their primary provider.  All family members should be referred for colonoscopy starting at age 95, or as directed by their physician.   FOLLOW-UP: Lastly, we discussed with Ms. Sircy that cancer genetics is a rapidly advancing field and it is possible that new genetic tests will be appropriate for her and/or her family members in the future. We encouraged her to remain in contact with cancer genetics on an annual basis so we can update her personal and family histories and let her know of advances in cancer genetics that may benefit this family.   Our contact number was provided. Ms. Wrubel questions were answered to her satisfaction, and she knows she is welcome to  call us at anytime with additional questions or concerns.   Faith Rogue, MS, [facility] Genetic Counselor [email or web address] Phone: [phone]

## 2019-07-06 LAB — SURGICAL PATHOLOGY

## 2019-07-13 ENCOUNTER — Encounter: Payer: Self-pay | Admitting: Oncology

## 2019-07-13 ENCOUNTER — Other Ambulatory Visit: Payer: Self-pay

## 2019-07-13 ENCOUNTER — Inpatient Hospital Stay (HOSPITAL_BASED_OUTPATIENT_CLINIC_OR_DEPARTMENT_OTHER): Payer: Medicare Other | Admitting: Oncology

## 2019-07-13 ENCOUNTER — Ambulatory Visit
Admission: RE | Admit: 2019-07-13 | Discharge: 2019-07-13 | Disposition: A | Discharge status: Home / Self Care | Payer: Medicare Other | Source: Ambulatory Visit | Attending: Radiation Oncology | Admitting: Radiation Oncology

## 2019-07-13 VITALS — BP 131/65 | HR 62 | Temp 98.3°F | Resp 16 | Wt 163.1 lb

## 2019-07-13 DIAGNOSIS — Z170 Estrogen receptor positive status [ER+]: Secondary | ICD-10-CM | POA: Insufficient documentation

## 2019-07-13 DIAGNOSIS — Z801 Family history of malignant neoplasm of trachea, bronchus and lung: Secondary | ICD-10-CM | POA: Insufficient documentation

## 2019-07-13 DIAGNOSIS — N6489 Other specified disorders of breast: Secondary | ICD-10-CM | POA: Diagnosis not present

## 2019-07-13 DIAGNOSIS — Z87891 Personal history of nicotine dependence: Secondary | ICD-10-CM | POA: Diagnosis not present

## 2019-07-13 DIAGNOSIS — C50919 Malignant neoplasm of unspecified site of unspecified female breast: Secondary | ICD-10-CM

## 2019-07-13 DIAGNOSIS — C50912 Malignant neoplasm of unspecified site of left female breast: Secondary | ICD-10-CM | POA: Diagnosis not present

## 2019-07-13 DIAGNOSIS — Z7189 Other specified counseling: Secondary | ICD-10-CM

## 2019-07-13 DIAGNOSIS — Z8249 Family history of ischemic heart disease and other diseases of the circulatory system: Secondary | ICD-10-CM | POA: Diagnosis not present

## 2019-07-13 DIAGNOSIS — Z803 Family history of malignant neoplasm of breast: Secondary | ICD-10-CM | POA: Insufficient documentation

## 2019-07-13 NOTE — Progress Notes (Signed)
No breast issues. She saw surgeon this week.

## 2019-07-13 NOTE — Consult Note (Signed)
NEW PATIENT EVALUATION  Name: Elizabeth Morgan  MRN: [id number]  Date:   07/13/2019     DOB: April 22, 1950   This 69 y.o. female patient presents to the clinic for initial evaluation of pathologic stage pT2 N0 M0 ER/PR positive HER-2 negative invasive mammary carcinoma of the left breast.  Status post wide local excision and sentinel node biopsy  REFERRING PHYSICIAN: Jerrol Banana.,*  CHIEF COMPLAINT:  Chief Complaint  Patient presents with  . Breast Cancer    Initial consultation    DIAGNOSIS: The encounter diagnosis was Invasive carcinoma of breast ([place]).   PREVIOUS INVESTIGATIONS:  Mammogram and ultrasound reviewed Surgical pathology report reviewed Clinical notes reviewed  HPI: Patient is a 69 year old female who presented with an abnormal mammogram back in May show a 2.4 cm mass in the left breast at the 12 o'clock position in the retroareolar region.  She underwent ultrasound-guided biopsy which was positive for invasive mammary carcinoma.  She was seen by Dr. Tollie Pizza opted for breast conservation and underwent wide local excision with mammoplasty as well as sentinel node biopsy.  Tumor was 2.4 x 2.8 cm overall grade 3 ER/PR positive HER-2/neu not overexpressed.  Initial lumpectomy anterior margin was positive although reexcision it was widely negative.  1 sentinel lymph node was negative for metastatic disease.  She is healing slowly based on the mastopexy.  Oncotype DX has been ordered and is pending.  She is somewhat uncomfortable has developed a seroma in the left axilla although that has been examined and is being followed by Dr. Tollie Pizza.  She is now referred to ration collagen for consideration of treatment.  PLANNED TREATMENT REGIMEN: Left whole breast radiation  PAST MEDICAL HISTORY:  has a past medical history of Allergy, Breast cancer ([place]), Cancer ([place]), Family history of breast cancer, Family history of lung cancer, Hearing loss, Hernia, abdominal, History of  herpes zoster (11/05/2015), and Seasonal allergies.    PAST SURGICAL HISTORY:  Past Surgical History:  Procedure Laterality Date  . ARTHRODESIS METATARSALPHALANGEAL JOINT (MTPJ) Left 06/03/2016   Procedure: ARTHRODESIS METATARSALPHALANGEAL JOINT (MTPJ);  Surgeon: Albertine Patricia, DPM;  Location: [place];  Service: Podiatry;  Laterality: Left;  . BREAST LUMPECTOMY WITH SENTINEL LYMPH NODE BIOPSY Left 06/29/2019   Procedure: BREAST LUMPECTOMY WITH SENTINEL LYMPH NODE BX;  Surgeon: Robert Bellow, MD;  Location: [facility];  Service: General;  Laterality: Left;  . CESAREAN SECTION    . COLONOSCOPY    . EYE SURGERY Bilateral    X 2  . HAMMER TOE SURGERY Left 06/03/2016   Procedure: HAMMER TOE CORRECTION - 2nd left toe;  Surgeon: Albertine Patricia, DPM;  Location: [place];  Service: Podiatry;  Laterality: Left;  . TUBAL LIGATION    . WEIL OSTEOTOMY Left 06/03/2016   Procedure: WEIL OSTEOTOMY  SECOND TOE;  Surgeon: Albertine Patricia, DPM;  Location: [place];  Service: Podiatry;  Laterality: Left;    FAMILY HISTORY: family history includes Breast cancer (age of onset: 2) in her sister; Dementia in her father and mother; Heart disease in her father; Hypertension in her father; Lung cancer in her father, maternal aunt, and maternal uncle.  SOCIAL HISTORY:  reports that she quit smoking about 33 years ago. She quit after 10.00 years of use. She has never used smokeless tobacco. She reports current alcohol use. She reports that she does not use drugs.  ALLERGIES: Aleve [naproxen sodium], Aspirin, Chicken protein, Codeine, and Naproxen  MEDICATIONS:  Current Outpatient Medications  Medication Sig Dispense Refill  . cetirizine (ZYRTEC ALLERGY) 10 MG tablet Take 10 mg by mouth daily as needed (allergies).     . fluticasone (FLONASE) 50 MCG/ACT nasal spray Place 1 spray into both nostrils daily as needed for allergies or rhinitis.     No current  facility-administered medications for this encounter.    ECOG PERFORMANCE STATUS:  0 - Asymptomatic  REVIEW OF SYSTEMS: Patient denies any weight loss, fatigue, weakness, fever, chills or night sweats. Patient denies any loss of vision, blurred vision. Patient denies any ringing  of the ears or hearing loss. No irregular heartbeat. Patient denies heart murmur or history of fainting. Patient denies any chest pain or pain radiating to her upper extremities. Patient denies any shortness of breath, difficulty breathing at night, cough or hemoptysis. Patient denies any swelling in the lower legs. Patient denies any nausea vomiting, vomiting of blood, or coffee ground material in the vomitus. Patient denies any stomach pain. Patient states has had normal bowel movements no significant constipation or diarrhea. Patient denies any dysuria, hematuria or significant nocturia. Patient denies any problems walking, swelling in the joints or loss of balance. Patient denies any skin changes, loss of hair or loss of weight. Patient denies any excessive worrying or anxiety or significant depression. Patient denies any problems with insomnia. Patient denies excessive thirst, polyuria, polydipsia. Patient denies any swollen glands, patient denies easy bruising or easy bleeding. Patient denies any recent infections, allergies or URI. Patient "s visual fields have not changed significantly in recent time.   PHYSICAL EXAM: There were no vitals taken for this visit. Left breast is somewhat swollen with some ecchymosis present.  No dominant mass or nodularity is noted in either breast.  Her incisions are healing well.  She does have a seroma present in the left axilla no supraclavicular outer adenopathy or right axillary adenopathy is appreciated.  Well-developed well-nourished patient in NAD. HEENT reveals PERLA, EOMI, discs not visualized.  Oral cavity is clear. No oral mucosal lesions are identified. Neck is clear without  evidence of cervical or supraclavicular adenopathy. Lungs are clear to A&P. Cardiac examination is essentially unremarkable with regular rate and rhythm without murmur rub or thrill. Abdomen is benign with no organomegaly or masses noted. Motor sensory and DTR levels are equal and symmetric in the upper and lower extremities. Cranial nerves II through XII are grossly intact. Proprioception is intact. No peripheral adenopathy or edema is identified. No motor or sensory levels are noted. Crude visual fields are within normal range.  LABORATORY DATA: Pathology report reviewed    RADIOLOGY RESULTS: Mammogram and ultrasound reviewed   IMPRESSION: T2 N0 M0 ER/PR positive HER-2 negative invasive mammary carcinoma left breast status post wide local excision mastopexy and sentinel node biopsy in 69 year old female  PLAN: This time I recommended whole breast radiation.  I believe her breasts is too large for hypofractionated course of treatment with plan on delivering 5040 cGy in 28 fractions boosting her scar another 1400 centigrade.  I will await her Oncotype DX which is expected about 2 weeks.  Should she need systemic chemotherapy will sequence radiation after her chemotherapy.  Side effects of radiation including skin reaction fatigue possible inclusion of superficial lung all were described in detail to the patient.  She seems to comprehend my recommendations well.  She will contact us once decision is made on systemic chemotherapy based on her Oncotype DX.  I would like to take this opportunity to thank you for allowing me  to participate in the care of your patient.Noreene Filbert, MD

## 2019-07-15 NOTE — Progress Notes (Signed)
Hematology/Oncology Consult note [facility]  [phone] [phone]  Patient Care Team: Jerrol Banana., MD as PCP - General (Family Medicine) Theodore Demark, RN as Oncology Nurse Navigator   Name of the patient: Elizabeth Morgan  [id number]  1950/10/16   Date of visit: 07/15/19  Diagnosis-pathological prognostic stage Ia invasive mammary carcinoma of the left breast pT2 pN0 cM0 grade 3 ER/PR positive HER-2/neu negative s/p lumpectomy  Chief complaint/ Reason for visit-discuss final pathology results and further management  Heme/Onc history: Patient is a 69 year old female who underwent a recent screening mammogram on 05/21/2019 which showed abnormality in the left breast.  This was followed by diagnostic mammogram and ultrasound which showed a 2.4 x 1.4 x 1.9 cm mass in the left breast.  No lymphadenopathy seen in the left axilla.  Core biopsy showed a 15 mm grade 3 invasive mammary carcinoma with focal mucinous features.  ER/PR and HER-2 status was not available at the time of her visit  Patient is currently doing well for her age she does not have any significant comorbidities.  She is independent of her ADLs and IADLs.Patient sister had breast cancer at the age of 26.  Final pathology showed invasive mammary carcinoma with negative margins 2.4 x 2.8 x 1.5 cm, grade 3 ER/PR positive HER-2 -1 sentinel lymph node negative for malignancy.  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  HENT: Negative for congestion, ear discharge and nosebleeds.   Eyes: Negative for blurred vision.  Respiratory: Negative for cough, hemoptysis, sputum production, shortness of breath and  wheezing.   Cardiovascular: Negative for chest pain, palpitations, orthopnea and claudication.  Gastrointestinal: Negative for abdominal pain, blood in stool, constipation, diarrhea, heartburn, melena, nausea and vomiting.  Genitourinary: Negative for dysuria, flank pain, frequency, hematuria and urgency.  Musculoskeletal: Negative for back pain, joint pain and myalgias.  Skin: Negative for rash.  Neurological: Negative for dizziness, tingling, focal weakness, seizures, weakness and headaches.  Endo/Heme/Allergies: Does not bruise/bleed easily.  Psychiatric/Behavioral: Negative for depression and suicidal ideas. The patient does not have insomnia.       Allergies  Allergen Reactions  . Aleve [Naproxen Sodium] Other (See Comments)    BURN STOMACH  . Aspirin Other (See Comments)    BURN STOMACH  . Chicken Protein     Dizziness  . Codeine Nausea And Vomiting  . Naproxen Other (See Comments)    Stomach burns      Past Medical History:  Diagnosis Date  . Allergy   . Breast cancer ([place])   . Cancer [facility])    left breast  . Family history of breast cancer   . Family history of lung cancer   . Hearing loss   . Hernia, abdominal    showed up end of April 2018  . History of herpes zoster 11/05/2015  . Seasonal allergies      Past Surgical History:  Procedure Laterality Date  . ARTHRODESIS METATARSALPHALANGEAL JOINT (MTPJ) Left 06/03/2016   Procedure: ARTHRODESIS METATARSALPHALANGEAL JOINT (MTPJ);  Surgeon: Albertine Patricia, DPM;  Location: [place];  Service: Podiatry;  Laterality: Left;  . BREAST LUMPECTOMY WITH SENTINEL LYMPH NODE BIOPSY Left 06/29/2019   Procedure: BREAST LUMPECTOMY WITH SENTINEL  LYMPH NODE BX;  Surgeon: Robert Bellow, MD;  Location: [facility];  Service: General;  Laterality: Left;  . CESAREAN SECTION    . COLONOSCOPY    . EYE SURGERY Bilateral    X 2  . HAMMER TOE SURGERY Left 06/03/2016   Procedure: HAMMER TOE CORRECTION - 2nd left toe;   Surgeon: Albertine Patricia, DPM;  Location: [place];  Service: Podiatry;  Laterality: Left;  . TUBAL LIGATION    . WEIL OSTEOTOMY Left 06/03/2016   Procedure: WEIL OSTEOTOMY  SECOND TOE;  Surgeon: Albertine Patricia, DPM;  Location: [place];  Service: Podiatry;  Laterality: Left;    Social History   Socioeconomic History  . Marital status: Married    Spouse name: Not on file  . Number of children: 1  . Years of education: Not on file  . Highest education level: Bachelor's degree (e.g., BA, AB, BS)  Occupational History  . Occupation: retired  Tobacco Use  . Smoking status: Former Smoker    Years: 10.00    Quit date: 1988    Years since quitting: 33.5  . Smokeless tobacco: Never Used  . Tobacco comment: Quit about 30 years ago  Vaping Use  . Vaping Use: Never used  Substance and Sexual Activity  . Alcohol use: Yes    Comment: occasionally, 1x/month  . Drug use: No  . Sexual activity: Never  Other Topics Concern  . Not on file  Social History Narrative  . Not on file   Social Determinants of Health   Financial Resource Strain: Low Risk   . Difficulty of Paying Living Expenses: Not hard at all  Food Insecurity: No Food Insecurity  . Worried About Charity fundraiser in the Last Year: Never true  . Ran Out of Food in the Last Year: Never true  Transportation Needs: No Transportation Needs  . Lack of Transportation (Medical): No  . Lack of Transportation (Non-Medical): No  Physical Activity: Inactive  . Days of Exercise per Week: 0 days  . Minutes of Exercise per Session: 0 min  Stress: No Stress Concern Present  . Feeling of Stress : Not at all  Social Connections: Moderately Integrated  . Frequency of Communication with Friends and Family: More than three times a week  . Frequency of Social Gatherings with Friends and Family: Never  . Attends Religious Services: More than 4 times per year  . Active Member of Clubs or Organizations: No  . Attends  Archivist Meetings: Never  . Marital Status: Married  Human resources officer Violence: Not At Risk  . Fear of Current or Ex-Partner: No  . Emotionally Abused: No  . Physically Abused: No  . Sexually Abused: No    Family History  Problem Relation Age of Onset  . Dementia Mother   . Dementia Father   . Hypertension Father   . Heart disease Father   . Lung cancer Father   . Breast cancer Sister 56  . Lung cancer Maternal Aunt   . Lung cancer Maternal Uncle      Current Outpatient Medications:  .  cetirizine (ZYRTEC ALLERGY) 10 MG tablet, Take 10 mg by mouth daily as needed (allergies). , Disp: , Rfl:  .  fluticasone (FLONASE) 50 MCG/ACT nasal spray, Place 1 spray into both nostrils daily as needed for allergies or rhinitis., Disp: , Rfl:   Physical exam:  Vitals:   07/13/19 1035  BP: 131/65  Pulse: 62  Resp: 16  Temp: 98.3 F (36.8 C)  TempSrc: Oral  SpO2: 99%  Weight: 163 lb 1.6 oz (74 kg)   Physical Exam Constitutional:      General: She is not in acute distress. Pulmonary:     Effort: Pulmonary effort is normal.  Skin:    General: Skin is warm and dry.  Neurological:     Mental Status: She is alert and oriented to person, place, and time.   Breast: Lumpectomy scar is healing well.  There is still some edema and inflammation in the surgical site  CMP Latest Ref Rng & Units 03/28/2019  Glucose 65 - 99 mg/dL 84  BUN 8 - 27 mg/dL 18  Creatinine 0.57 - 1.00 mg/dL 0.67  Sodium 134 - 144 mmol/L 138  Potassium 3.5 - 5.2 mmol/L 4.6  Chloride 96 - 106 mmol/L 100  CO2 20 - 29 mmol/L 24  Calcium 8.7 - 10.3 mg/dL 9.8  Total Protein 6.0 - 8.5 g/dL 6.9  Total Bilirubin 0.0 - 1.2 mg/dL 0.4  Alkaline Phos 39 - 117 IU/L 89  AST 0 - 40 IU/L 24  ALT 0 - 32 IU/L 21   CBC Latest Ref Rng & Units 03/28/2019  WBC 3.4 - 10.8 x10E3/uL 6.7  Hemoglobin 11.1 - 15.9 g/dL 14.3  Hematocrit 34.0 - 46.6 % 42.2  Platelets 150 - 450 x10E3/uL 253    No images are attached to the  encounter.  NM SENTINEL NODE INJECTION  Result Date: 06/29/2019 CLINICAL DATA:  Left breast cancer. EXAM: NUCLEAR MEDICINE BREAST LYMPHOSCINTIGRAPHY LEFT BREAST TECHNIQUE: Intradermal injection of radiopharmaceutical was performed at the 12 o'clock, 3 o'clock, 6 o'clock, and 9 o'clock positions around the left nipple. The patient was then sent to the operating room where the sentinel node(s) were identified and removed by the surgeon. RADIOPHARMACEUTICALS:  Total of 1 mCi Millipore-filtered Technetium-32msulfur colloid, injected in four aliquots of 0.25 mCi each. IMPRESSION: Uncomplicated intradermal injection of a total of 1 mCi Technetium-932mulfur colloid for purposes of sentinel node identification. Electronically Signed   By: DaMarin Olp.D.   On: 06/29/2019 13:03   MM Breast Surgical Specimen  Result Date: 06/29/2019 CLINICAL DATA:  Evaluate surgical specimen following LEFT lumpectomy for breast cancer. EXAM: SPECIMEN RADIOGRAPH OF THE LEFT BREAST COMPARISON:  Previous exam(s). FINDINGS: Surgical specimen was submitted postoperatively for interpretation. The COIL biopsy clip within an apparent mass is identified within the specimen. IMPRESSION: Specimen radiograph of the LEFT breast. Electronically Signed   By: JeMargarette Canada.D.   On: 06/29/2019 13:59     Assessment and plan- Patient is a [age].o. female with invasive mammary carcinoma of the left breast pathological prognostic stage I apT2 pN0 cM0 ER/PR positive HER-2/neu negative s/p lumpectomy.  She is here to discuss pathology results and further management  Final pathology shows 2.4 x 2.8 x 1.5 cm grade 3 tumor with negative margins.  1 sentinel lymph node was negative for malignancy.  At this time I will be sending of Oncotype testing to determine if she would benefit from chemotherapy or not.  She has a low risk score of less than 11 or intermediate score between 11 and 25 she would not benefit from adjuvant chemo.  Adjuvant  chemotherapy would be indicated for a score of 26 and higher.  Patient is meeting radiation oncology today to discuss adjuvant radiation treatment  I will arrange her follow-up appointment based on Oncotype testing results. Treatment will be given with curative intent.  Visit Diagnosis 1. Invasive carcinoma of breast ([place])   2. Goals of care, counseling/discussion      Dr. Randa Evens, MD, MPH [facility] [phone] 07/15/2019 10:20 AM

## 2019-07-16 ENCOUNTER — Encounter: Payer: Self-pay | Admitting: Oncology

## 2019-07-16 ENCOUNTER — Telehealth: Payer: Self-pay

## 2019-07-16 NOTE — Telephone Encounter (Signed)
spoke with pt and let her know she does not need chemo based on her oncotype score per Dr. Janese Banks

## 2019-07-26 ENCOUNTER — Ambulatory Visit
Admission: RE | Admit: 2019-07-26 | Discharge: 2019-07-26 | Disposition: A | Discharge status: Home / Self Care | Payer: Medicare Other | Source: Ambulatory Visit | Attending: Radiation Oncology | Admitting: Radiation Oncology

## 2019-07-26 DIAGNOSIS — C50912 Malignant neoplasm of unspecified site of left female breast: Secondary | ICD-10-CM | POA: Insufficient documentation

## 2019-07-26 DIAGNOSIS — Z510 Encounter for antineoplastic radiation therapy: Secondary | ICD-10-CM | POA: Diagnosis not present

## 2019-07-27 ENCOUNTER — Other Ambulatory Visit: Payer: Self-pay | Admitting: *Deleted

## 2019-07-27 DIAGNOSIS — C50919 Malignant neoplasm of unspecified site of unspecified female breast: Secondary | ICD-10-CM

## 2019-07-31 DIAGNOSIS — Z510 Encounter for antineoplastic radiation therapy: Secondary | ICD-10-CM | POA: Diagnosis not present

## 2019-07-31 DIAGNOSIS — C50912 Malignant neoplasm of unspecified site of left female breast: Secondary | ICD-10-CM | POA: Diagnosis not present

## 2019-08-02 ENCOUNTER — Ambulatory Visit: Admission: RE | Admit: 2019-08-02 | Payer: Medicare Other | Source: Ambulatory Visit

## 2019-08-02 DIAGNOSIS — Z510 Encounter for antineoplastic radiation therapy: Secondary | ICD-10-CM | POA: Diagnosis not present

## 2019-08-02 DIAGNOSIS — C50912 Malignant neoplasm of unspecified site of left female breast: Secondary | ICD-10-CM | POA: Diagnosis not present

## 2019-08-06 ENCOUNTER — Ambulatory Visit
Admission: RE | Admit: 2019-08-06 | Discharge: 2019-08-06 | Disposition: A | Discharge status: Home / Self Care | Payer: Medicare Other | Source: Ambulatory Visit | Attending: Radiation Oncology | Admitting: Radiation Oncology

## 2019-08-06 DIAGNOSIS — Z510 Encounter for antineoplastic radiation therapy: Secondary | ICD-10-CM | POA: Diagnosis not present

## 2019-08-06 DIAGNOSIS — C50912 Malignant neoplasm of unspecified site of left female breast: Secondary | ICD-10-CM | POA: Diagnosis not present

## 2019-08-07 ENCOUNTER — Ambulatory Visit
Admission: RE | Admit: 2019-08-07 | Discharge: 2019-08-07 | Disposition: A | Discharge status: Home / Self Care | Payer: Medicare Other | Source: Ambulatory Visit | Attending: Radiation Oncology | Admitting: Radiation Oncology

## 2019-08-07 DIAGNOSIS — C50912 Malignant neoplasm of unspecified site of left female breast: Secondary | ICD-10-CM | POA: Diagnosis not present

## 2019-08-07 DIAGNOSIS — Z510 Encounter for antineoplastic radiation therapy: Secondary | ICD-10-CM | POA: Diagnosis not present

## 2019-08-08 ENCOUNTER — Inpatient Hospital Stay: Payer: Medicare Other | Attending: Oncology | Admitting: Oncology

## 2019-08-08 ENCOUNTER — Other Ambulatory Visit: Payer: Self-pay

## 2019-08-08 ENCOUNTER — Ambulatory Visit
Admission: RE | Admit: 2019-08-08 | Discharge: 2019-08-08 | Disposition: A | Discharge status: Home / Self Care | Payer: Medicare Other | Source: Ambulatory Visit | Attending: Radiation Oncology | Admitting: Radiation Oncology

## 2019-08-08 VITALS — BP 134/60 | HR 63 | Temp 97.1°F | Resp 16 | Wt 162.0 lb

## 2019-08-08 DIAGNOSIS — Z170 Estrogen receptor positive status [ER+]: Secondary | ICD-10-CM | POA: Insufficient documentation

## 2019-08-08 DIAGNOSIS — Z87891 Personal history of nicotine dependence: Secondary | ICD-10-CM | POA: Diagnosis not present

## 2019-08-08 DIAGNOSIS — Z510 Encounter for antineoplastic radiation therapy: Secondary | ICD-10-CM | POA: Diagnosis not present

## 2019-08-08 DIAGNOSIS — Z7189 Other specified counseling: Secondary | ICD-10-CM

## 2019-08-08 DIAGNOSIS — Z08 Encounter for follow-up examination after completed treatment for malignant neoplasm: Secondary | ICD-10-CM

## 2019-08-08 DIAGNOSIS — C50912 Malignant neoplasm of unspecified site of left female breast: Secondary | ICD-10-CM | POA: Insufficient documentation

## 2019-08-08 DIAGNOSIS — Z853 Personal history of malignant neoplasm of breast: Secondary | ICD-10-CM

## 2019-08-08 MED ORDER — ANASTROZOLE 1 MG PO TABS
1.0000 mg | ORAL_TABLET | Freq: Every day | ORAL | 3 refills | Status: DC
Start: 2019-08-08 — End: 2019-12-11

## 2019-08-08 NOTE — Progress Notes (Signed)
Hematology/Oncology Consult note [facility]  [phone] [phone]  Patient Care Team: Jerrol Banana., MD as PCP - General (Family Medicine) Theodore Demark, RN as Oncology Nurse Navigator   Name of the patient: Elizabeth Morgan  [id number]  05-10-1950   Date of visit: 08/08/19  Diagnosis- pathological prognostic stage Ia invasive mammary carcinoma of the left breast pT2 pN0 cM0 grade 3 ER/PR positive HER-2/neu negative s/p lumpectomy  Chief complaint/ Reason for visit-routine follow-up of breast cancer  Heme/Onc history: Patient is a 70 year old female who underwent a recent screening mammogram on 05/21/2019 which showed abnormality in the left breast. This was followed by diagnostic mammogram and ultrasound which showed a 2.4 x 1.4 x 1.9 cm mass in the left breast. No lymphadenopathy seen in the left axilla. Core biopsy showed a 15 mm grade 3 invasive mammary carcinoma with focal mucinous features. ER/PR and HER-2 status was not available at the time of her visit  Patient is currently doing well for her age she does not have any significant comorbidities. She is independent of her ADLs and IADLs.Patient sister had breast cancer at the age of 57.  Final pathology showed invasive mammary carcinoma with negative margins 2.4 x 2.8 x 1.5 cm, grade 3 ER/PR positive HER-2 -1 sentinel lymph node negative for malignancy.  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  At her age this does not translate into absolute chemotherapy benefit.  Patient therefore did not require any adjuvant chemotherapy patient is currently going through radiation treatment and will be completing that on September 25, 2019   Interval history-patient started radiation treatment 3 days ago and will finish it on 09/25/2019.  She is doing well post lumpectomy and denies any  complaints at this time  ECOG PS- 0 Pain scale- 0  Review of systems- Review of Systems  Constitutional: Negative for chills, fever, malaise/fatigue and weight loss.  HENT: Negative for congestion, ear discharge and nosebleeds.   Eyes: Negative for blurred vision.  Respiratory: Negative for cough, hemoptysis, sputum production, shortness of breath and wheezing.   Cardiovascular: Negative for chest pain, palpitations, orthopnea and claudication.  Gastrointestinal: Negative for abdominal pain, blood in stool, constipation, diarrhea, heartburn, melena, nausea and vomiting.  Genitourinary: Negative for dysuria, flank pain, frequency, hematuria and urgency.  Musculoskeletal: Negative for back pain, joint pain and myalgias.  Skin: Negative for rash.  Neurological: Negative for dizziness, tingling, focal weakness, seizures, weakness and headaches.  Endo/Heme/Allergies: Does not bruise/bleed easily.  Psychiatric/Behavioral: Negative for depression and suicidal ideas. The patient does not have insomnia.       Allergies  Allergen Reactions  . Aleve [Naproxen Sodium] Other (See Comments)    BURN STOMACH  . Aspirin Other (See Comments)    BURN STOMACH  . Chicken Protein     Dizziness  . Codeine Nausea And Vomiting  . Naproxen Other (See Comments)    Stomach burns      Past Medical History:  Diagnosis Date  . Allergy   . Breast cancer ([place])   . Cancer [facility])    left breast  . Family history of breast cancer   . Family history of lung cancer   . Hearing loss   . Hernia, abdominal    showed up end of April 2018  . History of herpes zoster 11/05/2015  . Seasonal allergies      Past  Surgical History:  Procedure Laterality Date  . ARTHRODESIS METATARSALPHALANGEAL JOINT (MTPJ) Left 06/03/2016   Procedure: ARTHRODESIS METATARSALPHALANGEAL JOINT (MTPJ);  Surgeon: Albertine Patricia, DPM;  Location: [place];  Service: Podiatry;  Laterality: Left;  . BREAST LUMPECTOMY WITH  SENTINEL LYMPH NODE BIOPSY Left 06/29/2019   Procedure: BREAST LUMPECTOMY WITH SENTINEL LYMPH NODE BX;  Surgeon: Robert Bellow, MD;  Location: [facility];  Service: General;  Laterality: Left;  . CESAREAN SECTION    . COLONOSCOPY    . EYE SURGERY Bilateral    X 2  . HAMMER TOE SURGERY Left 06/03/2016   Procedure: HAMMER TOE CORRECTION - 2nd left toe;  Surgeon: Albertine Patricia, DPM;  Location: [place];  Service: Podiatry;  Laterality: Left;  . TUBAL LIGATION    . WEIL OSTEOTOMY Left 06/03/2016   Procedure: WEIL OSTEOTOMY  SECOND TOE;  Surgeon: Albertine Patricia, DPM;  Location: [place];  Service: Podiatry;  Laterality: Left;    Social History   Socioeconomic History  . Marital status: Married    Spouse name: Not on file  . Number of children: 1  . Years of education: Not on file  . Highest education level: Bachelor's degree (e.g., BA, AB, BS)  Occupational History  . Occupation: retired  Tobacco Use  . Smoking status: Former Smoker    Years: 10.00    Quit date: 1988    Years since quitting: 33.5  . Smokeless tobacco: Never Used  . Tobacco comment: Quit about 30 years ago  Vaping Use  . Vaping Use: Never used  Substance and Sexual Activity  . Alcohol use: Yes    Comment: occasionally, 1x/month  . Drug use: No  . Sexual activity: Never  Other Topics Concern  . Not on file  Social History Narrative  . Not on file   Social Determinants of Health   Financial Resource Strain: Low Risk   . Difficulty of Paying Living Expenses: Not hard at all  Food Insecurity: No Food Insecurity  . Worried About Charity fundraiser in the Last Year: Never true  . Ran Out of Food in the Last Year: Never true  Transportation Needs: No Transportation Needs  . Lack of Transportation (Medical): No  . Lack of Transportation (Non-Medical): No  Physical Activity: Inactive  . Days of Exercise per Week: 0 days  . Minutes of Exercise per Session: 0 min  Stress: No Stress  Concern Present  . Feeling of Stress : Not at all  Social Connections: Moderately Integrated  . Frequency of Communication with Friends and Family: More than three times a week  . Frequency of Social Gatherings with Friends and Family: Never  . Attends Religious Services: More than 4 times per year  . Active Member of Clubs or Organizations: No  . Attends Archivist Meetings: Never  . Marital Status: Married  Human resources officer Violence: Not At Risk  . Fear of Current or Ex-Partner: No  . Emotionally Abused: No  . Physically Abused: No  . Sexually Abused: No    Family History  Problem Relation Age of Onset  . Dementia Mother   . Dementia Father   . Hypertension Father   . Heart disease Father   . Lung cancer Father   . Breast cancer Sister 49  . Lung cancer Maternal Aunt   . Lung cancer Maternal Uncle      Current Outpatient Medications:  .  cetirizine (ZYRTEC ALLERGY) 10 MG tablet, Take 10 mg  by mouth daily as needed (allergies). , Disp: , Rfl:  .  fluticasone (FLONASE) 50 MCG/ACT nasal spray, Place 1 spray into both nostrils daily as needed for allergies or rhinitis., Disp: , Rfl:   Physical exam:  Vitals:   08/08/19 0938  BP: 134/60  Pulse: 63  Resp: 16  Temp: (!) 97.1 F (36.2 C)  TempSrc: Tympanic  SpO2: 99%  Weight: 162 lb (73.5 kg)   Physical Exam Pulmonary:     Effort: Pulmonary effort is normal.  Skin:    General: Skin is warm and dry.  Neurological:     Mental Status: She is alert and oriented to person, place, and time.      CMP Latest Ref Rng & Units 03/28/2019  Glucose 65 - 99 mg/dL 84  BUN 8 - 27 mg/dL 18  Creatinine 0.57 - 1.00 mg/dL 0.67  Sodium 134 - 144 mmol/L 138  Potassium 3.5 - 5.2 mmol/L 4.6  Chloride 96 - 106 mmol/L 100  CO2 20 - 29 mmol/L 24  Calcium 8.7 - 10.3 mg/dL 9.8  Total Protein 6.0 - 8.5 g/dL 6.9  Total Bilirubin 0.0 - 1.2 mg/dL 0.4  Alkaline Phos 39 - 117 IU/L 89  AST 0 - 40 IU/L 24  ALT 0 - 32 IU/L 21   CBC  Latest Ref Rng & Units 03/28/2019  WBC 3.4 - 10.8 x10E3/uL 6.7  Hemoglobin 11.1 - 15.9 g/dL 14.3  Hematocrit 34.0 - 46.6 % 42.2  Platelets 150 - 450 x10E3/uL 253      Assessment and plan- Patient is a 69 y.o. female with invasive mammary carcinoma of the left breast pathological prognostic stage I apT2 pN0 cM0 ER/PR positive HER-2/neu negative s/p lumpectomy.   Patient did not require adjuvant chemotherapy based on her Oncotype score of 13.  She is currently going to adjuvant radiation treatment and this is a routine follow-up visit  Again discussed Oncotype results with the patient which came back as an intermediate score of 13 and given that her age is more than 89 she does not benefit from adjuvant chemotherapy.  She will be completing adjuvant radiation treatment on September 25, 2019.    Given that her tumor was ER PR positive hormone therapy is indicated at this time.  Idiscussed the role for hormone therapy. Given that she is postmenopausal I would favor 5 years of adjuvant hormone therapy with aromatase inhibitor. I discussed the risks and benefits of Arimidex including all but not limited to fatigue, hypercholesterolemia, hot flashes, arthralgias and worsening bone health.  Patient will also need to be on calcium 1200 mg along with vitamin D 800 international units.  We will obtain a baseline bone density scan written information about Arimidex given to the patient. I would like her to finish radiation therapy and start hormone therapy thereafter. Patient verbalized understanding and agrees to proceed.  Treatment will be given with a curative intent  She did have a baseline bone density scan on May 21, 2019 which was normal with a T score of -0.8 of the left femur.  She therefore does not require any additional bisphosphonates at this time    Visit Diagnosis 1. Encounter for follow-up surveillance of breast cancer   2. Goals of care, counseling/discussion      Dr. Randa Evens, MD,  MP[facility] [phone] 08/08/2019 9:36 AM

## 2019-08-09 ENCOUNTER — Ambulatory Visit
Admission: RE | Admit: 2019-08-09 | Discharge: 2019-08-09 | Disposition: A | Discharge status: Home / Self Care | Payer: Medicare Other | Source: Ambulatory Visit | Attending: Radiation Oncology | Admitting: Radiation Oncology

## 2019-08-09 DIAGNOSIS — Z510 Encounter for antineoplastic radiation therapy: Secondary | ICD-10-CM | POA: Diagnosis not present

## 2019-08-09 DIAGNOSIS — C50912 Malignant neoplasm of unspecified site of left female breast: Secondary | ICD-10-CM | POA: Diagnosis not present

## 2019-08-10 ENCOUNTER — Ambulatory Visit
Admission: RE | Admit: 2019-08-10 | Discharge: 2019-08-10 | Disposition: A | Discharge status: Home / Self Care | Payer: Medicare Other | Source: Ambulatory Visit | Attending: Radiation Oncology | Admitting: Radiation Oncology

## 2019-08-10 DIAGNOSIS — C50912 Malignant neoplasm of unspecified site of left female breast: Secondary | ICD-10-CM | POA: Diagnosis not present

## 2019-08-10 DIAGNOSIS — Z510 Encounter for antineoplastic radiation therapy: Secondary | ICD-10-CM | POA: Diagnosis not present

## 2019-08-13 ENCOUNTER — Ambulatory Visit
Admission: RE | Admit: 2019-08-13 | Discharge: 2019-08-13 | Disposition: A | Discharge status: Home / Self Care | Payer: Medicare Other | Source: Ambulatory Visit | Attending: Radiation Oncology | Admitting: Radiation Oncology

## 2019-08-13 DIAGNOSIS — C50912 Malignant neoplasm of unspecified site of left female breast: Secondary | ICD-10-CM | POA: Diagnosis not present

## 2019-08-13 DIAGNOSIS — Z510 Encounter for antineoplastic radiation therapy: Secondary | ICD-10-CM | POA: Diagnosis not present

## 2019-08-14 ENCOUNTER — Ambulatory Visit
Admission: RE | Admit: 2019-08-14 | Discharge: 2019-08-14 | Disposition: A | Discharge status: Home / Self Care | Payer: Medicare Other | Source: Ambulatory Visit | Attending: Radiation Oncology | Admitting: Radiation Oncology

## 2019-08-14 DIAGNOSIS — C50912 Malignant neoplasm of unspecified site of left female breast: Secondary | ICD-10-CM | POA: Diagnosis not present

## 2019-08-14 DIAGNOSIS — Z510 Encounter for antineoplastic radiation therapy: Secondary | ICD-10-CM | POA: Diagnosis not present

## 2019-08-15 ENCOUNTER — Ambulatory Visit
Admission: RE | Admit: 2019-08-15 | Discharge: 2019-08-15 | Disposition: A | Discharge status: Home / Self Care | Payer: Medicare Other | Source: Ambulatory Visit | Attending: Radiation Oncology | Admitting: Radiation Oncology

## 2019-08-15 DIAGNOSIS — Z510 Encounter for antineoplastic radiation therapy: Secondary | ICD-10-CM | POA: Diagnosis not present

## 2019-08-15 DIAGNOSIS — C50912 Malignant neoplasm of unspecified site of left female breast: Secondary | ICD-10-CM | POA: Diagnosis not present

## 2019-08-16 ENCOUNTER — Ambulatory Visit
Admission: RE | Admit: 2019-08-16 | Discharge: 2019-08-16 | Disposition: A | Discharge status: Home / Self Care | Payer: Medicare Other | Source: Ambulatory Visit | Attending: Radiation Oncology | Admitting: Radiation Oncology

## 2019-08-16 DIAGNOSIS — C50912 Malignant neoplasm of unspecified site of left female breast: Secondary | ICD-10-CM | POA: Diagnosis not present

## 2019-08-16 DIAGNOSIS — Z510 Encounter for antineoplastic radiation therapy: Secondary | ICD-10-CM | POA: Diagnosis not present

## 2019-08-17 ENCOUNTER — Ambulatory Visit
Admission: RE | Admit: 2019-08-17 | Discharge: 2019-08-17 | Disposition: A | Discharge status: Home / Self Care | Payer: Medicare Other | Source: Ambulatory Visit | Attending: Radiation Oncology | Admitting: Radiation Oncology

## 2019-08-17 DIAGNOSIS — C50912 Malignant neoplasm of unspecified site of left female breast: Secondary | ICD-10-CM | POA: Diagnosis not present

## 2019-08-17 DIAGNOSIS — Z510 Encounter for antineoplastic radiation therapy: Secondary | ICD-10-CM | POA: Diagnosis not present

## 2019-08-20 ENCOUNTER — Ambulatory Visit
Admission: RE | Admit: 2019-08-20 | Discharge: 2019-08-20 | Disposition: A | Discharge status: Home / Self Care | Payer: Medicare Other | Source: Ambulatory Visit | Attending: Radiation Oncology | Admitting: Radiation Oncology

## 2019-08-20 ENCOUNTER — Other Ambulatory Visit: Payer: Self-pay

## 2019-08-20 ENCOUNTER — Inpatient Hospital Stay: Payer: Medicare Other | Attending: Oncology

## 2019-08-20 DIAGNOSIS — Z170 Estrogen receptor positive status [ER+]: Secondary | ICD-10-CM | POA: Insufficient documentation

## 2019-08-20 DIAGNOSIS — C50919 Malignant neoplasm of unspecified site of unspecified female breast: Secondary | ICD-10-CM

## 2019-08-20 DIAGNOSIS — Z510 Encounter for antineoplastic radiation therapy: Secondary | ICD-10-CM | POA: Diagnosis not present

## 2019-08-20 DIAGNOSIS — C50912 Malignant neoplasm of unspecified site of left female breast: Secondary | ICD-10-CM | POA: Insufficient documentation

## 2019-08-20 LAB — CBC
HCT: 38.9 % (ref 36.0–46.0)
Hemoglobin: 13.5 g/dL (ref 12.0–15.0)
MCH: 30.3 pg (ref 26.0–34.0)
MCHC: 34.7 g/dL (ref 30.0–36.0)
MCV: 87.4 fL (ref 80.0–100.0)
Platelets: 216 10*3/uL (ref 150–400)
RBC: 4.45 MIL/uL (ref 3.87–5.11)
RDW: 11.8 % (ref 11.5–15.5)
WBC: 7.5 10*3/uL (ref 4.0–10.5)
nRBC: 0 % (ref 0.0–0.2)

## 2019-08-21 ENCOUNTER — Ambulatory Visit
Admission: RE | Admit: 2019-08-21 | Discharge: 2019-08-21 | Disposition: A | Discharge status: Home / Self Care | Payer: Medicare Other | Source: Ambulatory Visit | Attending: Radiation Oncology | Admitting: Radiation Oncology

## 2019-08-21 DIAGNOSIS — C50912 Malignant neoplasm of unspecified site of left female breast: Secondary | ICD-10-CM | POA: Diagnosis not present

## 2019-08-21 DIAGNOSIS — Z510 Encounter for antineoplastic radiation therapy: Secondary | ICD-10-CM | POA: Diagnosis not present

## 2019-08-22 ENCOUNTER — Ambulatory Visit
Admission: RE | Admit: 2019-08-22 | Discharge: 2019-08-22 | Disposition: A | Discharge status: Home / Self Care | Payer: Medicare Other | Source: Ambulatory Visit | Attending: Radiation Oncology | Admitting: Radiation Oncology

## 2019-08-22 DIAGNOSIS — C50912 Malignant neoplasm of unspecified site of left female breast: Secondary | ICD-10-CM | POA: Diagnosis not present

## 2019-08-22 DIAGNOSIS — Z510 Encounter for antineoplastic radiation therapy: Secondary | ICD-10-CM | POA: Diagnosis not present

## 2019-08-23 ENCOUNTER — Ambulatory Visit
Admission: RE | Admit: 2019-08-23 | Discharge: 2019-08-23 | Disposition: A | Discharge status: Home / Self Care | Payer: Medicare Other | Source: Ambulatory Visit | Attending: Radiation Oncology | Admitting: Radiation Oncology

## 2019-08-23 DIAGNOSIS — Z510 Encounter for antineoplastic radiation therapy: Secondary | ICD-10-CM | POA: Diagnosis not present

## 2019-08-23 DIAGNOSIS — C50912 Malignant neoplasm of unspecified site of left female breast: Secondary | ICD-10-CM | POA: Diagnosis not present

## 2019-08-24 ENCOUNTER — Ambulatory Visit
Admission: RE | Admit: 2019-08-24 | Discharge: 2019-08-24 | Disposition: A | Discharge status: Home / Self Care | Payer: Medicare Other | Source: Ambulatory Visit | Attending: Radiation Oncology | Admitting: Radiation Oncology

## 2019-08-24 DIAGNOSIS — Z510 Encounter for antineoplastic radiation therapy: Secondary | ICD-10-CM | POA: Diagnosis not present

## 2019-08-24 DIAGNOSIS — C50912 Malignant neoplasm of unspecified site of left female breast: Secondary | ICD-10-CM | POA: Diagnosis not present

## 2019-08-27 ENCOUNTER — Ambulatory Visit
Admission: RE | Admit: 2019-08-27 | Discharge: 2019-08-27 | Disposition: A | Discharge status: Home / Self Care | Payer: Medicare Other | Source: Ambulatory Visit | Attending: Radiation Oncology | Admitting: Radiation Oncology

## 2019-08-27 DIAGNOSIS — Z510 Encounter for antineoplastic radiation therapy: Secondary | ICD-10-CM | POA: Diagnosis not present

## 2019-08-27 DIAGNOSIS — C50912 Malignant neoplasm of unspecified site of left female breast: Secondary | ICD-10-CM | POA: Diagnosis not present

## 2019-08-28 ENCOUNTER — Ambulatory Visit
Admission: RE | Admit: 2019-08-28 | Discharge: 2019-08-28 | Disposition: A | Discharge status: Home / Self Care | Payer: Medicare Other | Source: Ambulatory Visit | Attending: Radiation Oncology | Admitting: Radiation Oncology

## 2019-08-28 DIAGNOSIS — Z510 Encounter for antineoplastic radiation therapy: Secondary | ICD-10-CM | POA: Diagnosis not present

## 2019-08-28 DIAGNOSIS — C50912 Malignant neoplasm of unspecified site of left female breast: Secondary | ICD-10-CM | POA: Diagnosis not present

## 2019-08-29 ENCOUNTER — Ambulatory Visit
Admission: RE | Admit: 2019-08-29 | Discharge: 2019-08-29 | Disposition: A | Discharge status: Home / Self Care | Payer: Medicare Other | Source: Ambulatory Visit | Attending: Radiation Oncology | Admitting: Radiation Oncology

## 2019-08-29 DIAGNOSIS — Z510 Encounter for antineoplastic radiation therapy: Secondary | ICD-10-CM | POA: Diagnosis not present

## 2019-08-29 DIAGNOSIS — C50912 Malignant neoplasm of unspecified site of left female breast: Secondary | ICD-10-CM | POA: Diagnosis not present

## 2019-08-30 ENCOUNTER — Ambulatory Visit
Admission: RE | Admit: 2019-08-30 | Discharge: 2019-08-30 | Disposition: A | Discharge status: Home / Self Care | Payer: Medicare Other | Source: Ambulatory Visit | Attending: Radiation Oncology | Admitting: Radiation Oncology

## 2019-08-30 DIAGNOSIS — Z510 Encounter for antineoplastic radiation therapy: Secondary | ICD-10-CM | POA: Diagnosis not present

## 2019-08-30 DIAGNOSIS — C50912 Malignant neoplasm of unspecified site of left female breast: Secondary | ICD-10-CM | POA: Diagnosis not present

## 2019-08-31 ENCOUNTER — Ambulatory Visit
Admission: RE | Admit: 2019-08-31 | Discharge: 2019-08-31 | Disposition: A | Discharge status: Home / Self Care | Payer: Medicare Other | Source: Ambulatory Visit | Attending: Radiation Oncology | Admitting: Radiation Oncology

## 2019-08-31 DIAGNOSIS — C50912 Malignant neoplasm of unspecified site of left female breast: Secondary | ICD-10-CM | POA: Diagnosis not present

## 2019-08-31 DIAGNOSIS — Z510 Encounter for antineoplastic radiation therapy: Secondary | ICD-10-CM | POA: Diagnosis not present

## 2019-09-03 ENCOUNTER — Ambulatory Visit
Admission: RE | Admit: 2019-09-03 | Discharge: 2019-09-03 | Disposition: A | Discharge status: Home / Self Care | Payer: Medicare Other | Source: Ambulatory Visit | Attending: Radiation Oncology | Admitting: Radiation Oncology

## 2019-09-03 ENCOUNTER — Other Ambulatory Visit: Payer: Self-pay

## 2019-09-03 ENCOUNTER — Inpatient Hospital Stay: Payer: Medicare Other

## 2019-09-03 DIAGNOSIS — Z510 Encounter for antineoplastic radiation therapy: Secondary | ICD-10-CM | POA: Diagnosis not present

## 2019-09-03 DIAGNOSIS — C50912 Malignant neoplasm of unspecified site of left female breast: Secondary | ICD-10-CM | POA: Diagnosis not present

## 2019-09-03 DIAGNOSIS — C50919 Malignant neoplasm of unspecified site of unspecified female breast: Secondary | ICD-10-CM

## 2019-09-03 DIAGNOSIS — Z170 Estrogen receptor positive status [ER+]: Secondary | ICD-10-CM | POA: Diagnosis not present

## 2019-09-03 LAB — CBC
HCT: 35.2 % — ABNORMAL LOW (ref 36.0–46.0)
Hemoglobin: 12.5 g/dL (ref 12.0–15.0)
MCH: 30.6 pg (ref 26.0–34.0)
MCHC: 35.5 g/dL (ref 30.0–36.0)
MCV: 86.1 fL (ref 80.0–100.0)
Platelets: 207 10*3/uL (ref 150–400)
RBC: 4.09 MIL/uL (ref 3.87–5.11)
RDW: 11.9 % (ref 11.5–15.5)
WBC: 6.7 10*3/uL (ref 4.0–10.5)
nRBC: 0 % (ref 0.0–0.2)

## 2019-09-04 ENCOUNTER — Ambulatory Visit
Admission: RE | Admit: 2019-09-04 | Discharge: 2019-09-04 | Disposition: A | Discharge status: Home / Self Care | Payer: Medicare Other | Source: Ambulatory Visit | Attending: Radiation Oncology | Admitting: Radiation Oncology

## 2019-09-04 DIAGNOSIS — Z510 Encounter for antineoplastic radiation therapy: Secondary | ICD-10-CM | POA: Diagnosis not present

## 2019-09-04 DIAGNOSIS — C50912 Malignant neoplasm of unspecified site of left female breast: Secondary | ICD-10-CM | POA: Diagnosis not present

## 2019-09-05 ENCOUNTER — Ambulatory Visit
Admission: RE | Admit: 2019-09-05 | Discharge: 2019-09-05 | Disposition: A | Discharge status: Home / Self Care | Payer: Medicare Other | Source: Ambulatory Visit | Attending: Radiation Oncology | Admitting: Radiation Oncology

## 2019-09-05 DIAGNOSIS — Z510 Encounter for antineoplastic radiation therapy: Secondary | ICD-10-CM | POA: Diagnosis not present

## 2019-09-05 DIAGNOSIS — C50912 Malignant neoplasm of unspecified site of left female breast: Secondary | ICD-10-CM | POA: Diagnosis not present

## 2019-09-06 ENCOUNTER — Ambulatory Visit
Admission: RE | Admit: 2019-09-06 | Discharge: 2019-09-06 | Disposition: A | Discharge status: Home / Self Care | Payer: Medicare Other | Source: Ambulatory Visit | Attending: Radiation Oncology | Admitting: Radiation Oncology

## 2019-09-06 DIAGNOSIS — C50912 Malignant neoplasm of unspecified site of left female breast: Secondary | ICD-10-CM | POA: Diagnosis not present

## 2019-09-06 DIAGNOSIS — Z510 Encounter for antineoplastic radiation therapy: Secondary | ICD-10-CM | POA: Diagnosis not present

## 2019-09-07 ENCOUNTER — Ambulatory Visit
Admission: RE | Admit: 2019-09-07 | Discharge: 2019-09-07 | Disposition: A | Discharge status: Home / Self Care | Payer: Medicare Other | Source: Ambulatory Visit | Attending: Radiation Oncology | Admitting: Radiation Oncology

## 2019-09-07 DIAGNOSIS — Z510 Encounter for antineoplastic radiation therapy: Secondary | ICD-10-CM | POA: Diagnosis not present

## 2019-09-07 DIAGNOSIS — C50912 Malignant neoplasm of unspecified site of left female breast: Secondary | ICD-10-CM | POA: Diagnosis not present

## 2019-09-08 ENCOUNTER — Ambulatory Visit: Payer: Medicare Other

## 2019-09-09 DIAGNOSIS — Z510 Encounter for antineoplastic radiation therapy: Secondary | ICD-10-CM | POA: Diagnosis not present

## 2019-09-09 DIAGNOSIS — C50912 Malignant neoplasm of unspecified site of left female breast: Secondary | ICD-10-CM | POA: Diagnosis not present

## 2019-09-10 ENCOUNTER — Ambulatory Visit
Admission: RE | Admit: 2019-09-10 | Discharge: 2019-09-10 | Disposition: A | Discharge status: Home / Self Care | Payer: Medicare Other | Source: Ambulatory Visit | Attending: Radiation Oncology | Admitting: Radiation Oncology

## 2019-09-10 DIAGNOSIS — C50912 Malignant neoplasm of unspecified site of left female breast: Secondary | ICD-10-CM | POA: Diagnosis not present

## 2019-09-10 DIAGNOSIS — Z510 Encounter for antineoplastic radiation therapy: Secondary | ICD-10-CM | POA: Diagnosis not present

## 2019-09-11 ENCOUNTER — Ambulatory Visit
Admission: RE | Admit: 2019-09-11 | Discharge: 2019-09-11 | Disposition: A | Discharge status: Home / Self Care | Payer: Medicare Other | Source: Ambulatory Visit | Attending: Radiation Oncology | Admitting: Radiation Oncology

## 2019-09-11 DIAGNOSIS — Z510 Encounter for antineoplastic radiation therapy: Secondary | ICD-10-CM | POA: Diagnosis not present

## 2019-09-11 DIAGNOSIS — C50912 Malignant neoplasm of unspecified site of left female breast: Secondary | ICD-10-CM | POA: Diagnosis not present

## 2019-09-12 ENCOUNTER — Ambulatory Visit
Admission: RE | Admit: 2019-09-12 | Discharge: 2019-09-12 | Disposition: A | Discharge status: Home / Self Care | Payer: Medicare Other | Source: Ambulatory Visit | Attending: Radiation Oncology | Admitting: Radiation Oncology

## 2019-09-12 DIAGNOSIS — C50912 Malignant neoplasm of unspecified site of left female breast: Secondary | ICD-10-CM | POA: Diagnosis not present

## 2019-09-12 DIAGNOSIS — Z510 Encounter for antineoplastic radiation therapy: Secondary | ICD-10-CM | POA: Diagnosis not present

## 2019-09-13 ENCOUNTER — Ambulatory Visit
Admission: RE | Admit: 2019-09-13 | Discharge: 2019-09-13 | Disposition: A | Discharge status: Home / Self Care | Payer: Medicare Other | Source: Ambulatory Visit | Attending: Radiation Oncology | Admitting: Radiation Oncology

## 2019-09-13 DIAGNOSIS — Z510 Encounter for antineoplastic radiation therapy: Secondary | ICD-10-CM | POA: Diagnosis not present

## 2019-09-13 DIAGNOSIS — C50912 Malignant neoplasm of unspecified site of left female breast: Secondary | ICD-10-CM | POA: Diagnosis not present

## 2019-09-14 ENCOUNTER — Ambulatory Visit
Admission: RE | Admit: 2019-09-14 | Discharge: 2019-09-14 | Disposition: A | Discharge status: Home / Self Care | Payer: Medicare Other | Source: Ambulatory Visit | Attending: Radiation Oncology | Admitting: Radiation Oncology

## 2019-09-14 DIAGNOSIS — Z510 Encounter for antineoplastic radiation therapy: Secondary | ICD-10-CM | POA: Diagnosis not present

## 2019-09-14 DIAGNOSIS — C50912 Malignant neoplasm of unspecified site of left female breast: Secondary | ICD-10-CM | POA: Diagnosis not present

## 2019-09-17 ENCOUNTER — Ambulatory Visit
Admission: RE | Admit: 2019-09-17 | Discharge: 2019-09-17 | Disposition: A | Discharge status: Home / Self Care | Payer: Medicare Other | Source: Ambulatory Visit | Attending: Radiation Oncology | Admitting: Radiation Oncology

## 2019-09-17 ENCOUNTER — Inpatient Hospital Stay: Payer: Medicare Other

## 2019-09-17 ENCOUNTER — Other Ambulatory Visit: Payer: Self-pay

## 2019-09-17 DIAGNOSIS — Z170 Estrogen receptor positive status [ER+]: Secondary | ICD-10-CM | POA: Diagnosis not present

## 2019-09-17 DIAGNOSIS — C50919 Malignant neoplasm of unspecified site of unspecified female breast: Secondary | ICD-10-CM

## 2019-09-17 DIAGNOSIS — C50912 Malignant neoplasm of unspecified site of left female breast: Secondary | ICD-10-CM | POA: Diagnosis not present

## 2019-09-17 DIAGNOSIS — Z510 Encounter for antineoplastic radiation therapy: Secondary | ICD-10-CM | POA: Diagnosis not present

## 2019-09-17 LAB — CBC
HCT: 37.2 % (ref 36.0–46.0)
Hemoglobin: 13.2 g/dL (ref 12.0–15.0)
MCH: 30.4 pg (ref 26.0–34.0)
MCHC: 35.5 g/dL (ref 30.0–36.0)
MCV: 85.7 fL (ref 80.0–100.0)
Platelets: 220 10*3/uL (ref 150–400)
RBC: 4.34 MIL/uL (ref 3.87–5.11)
RDW: 11.9 % (ref 11.5–15.5)
WBC: 6.9 10*3/uL (ref 4.0–10.5)
nRBC: 0 % (ref 0.0–0.2)

## 2019-09-18 ENCOUNTER — Ambulatory Visit
Admission: RE | Admit: 2019-09-18 | Discharge: 2019-09-18 | Disposition: A | Discharge status: Home / Self Care | Payer: Medicare Other | Source: Ambulatory Visit | Attending: Radiation Oncology | Admitting: Radiation Oncology

## 2019-09-18 DIAGNOSIS — Z510 Encounter for antineoplastic radiation therapy: Secondary | ICD-10-CM | POA: Diagnosis not present

## 2019-09-18 DIAGNOSIS — C50912 Malignant neoplasm of unspecified site of left female breast: Secondary | ICD-10-CM | POA: Diagnosis not present

## 2019-09-19 ENCOUNTER — Ambulatory Visit
Admission: RE | Admit: 2019-09-19 | Discharge: 2019-09-19 | Disposition: A | Discharge status: Home / Self Care | Payer: Medicare Other | Source: Ambulatory Visit | Attending: Radiation Oncology | Admitting: Radiation Oncology

## 2019-09-19 DIAGNOSIS — C50912 Malignant neoplasm of unspecified site of left female breast: Secondary | ICD-10-CM | POA: Insufficient documentation

## 2019-09-19 DIAGNOSIS — Z510 Encounter for antineoplastic radiation therapy: Secondary | ICD-10-CM | POA: Insufficient documentation

## 2019-09-20 ENCOUNTER — Ambulatory Visit
Admission: RE | Admit: 2019-09-20 | Discharge: 2019-09-20 | Disposition: A | Discharge status: Home / Self Care | Payer: Medicare Other | Source: Ambulatory Visit | Attending: Radiation Oncology | Admitting: Radiation Oncology

## 2019-09-20 DIAGNOSIS — C50912 Malignant neoplasm of unspecified site of left female breast: Secondary | ICD-10-CM | POA: Diagnosis not present

## 2019-09-20 DIAGNOSIS — Z510 Encounter for antineoplastic radiation therapy: Secondary | ICD-10-CM | POA: Diagnosis not present

## 2019-09-21 ENCOUNTER — Ambulatory Visit
Admission: RE | Admit: 2019-09-21 | Discharge: 2019-09-21 | Disposition: A | Discharge status: Home / Self Care | Payer: Medicare Other | Source: Ambulatory Visit | Attending: Radiation Oncology | Admitting: Radiation Oncology

## 2019-09-21 DIAGNOSIS — Z510 Encounter for antineoplastic radiation therapy: Secondary | ICD-10-CM | POA: Diagnosis not present

## 2019-09-21 DIAGNOSIS — C50912 Malignant neoplasm of unspecified site of left female breast: Secondary | ICD-10-CM | POA: Diagnosis not present

## 2019-09-25 ENCOUNTER — Ambulatory Visit
Admission: RE | Admit: 2019-09-25 | Discharge: 2019-09-25 | Disposition: A | Discharge status: Home / Self Care | Payer: Medicare Other | Source: Ambulatory Visit | Attending: Radiation Oncology | Admitting: Radiation Oncology

## 2019-09-25 DIAGNOSIS — C50912 Malignant neoplasm of unspecified site of left female breast: Secondary | ICD-10-CM | POA: Diagnosis not present

## 2019-09-25 DIAGNOSIS — Z510 Encounter for antineoplastic radiation therapy: Secondary | ICD-10-CM | POA: Diagnosis not present

## 2019-10-02 ENCOUNTER — Other Ambulatory Visit: Payer: Self-pay | Admitting: General Surgery

## 2019-10-02 NOTE — Progress Notes (Signed)
Subjective:     Patient ID: Elizabeth Morgan is a 69 y.o. female.  HPI  The following portions of the patient's history were reviewed and updated as appropriate.  This an established patient is here today for: office visit. Here for her postoperative visit, left breast lumpectomy on 06-29-19. She states she completed her radiation and has started the Anastrozole.   Review of Systems  Constitutional: Negative for chills and fever.  Respiratory: Negative for cough.         Chief Complaint  Patient presents with  . Post Operative Visit    left breast lumpectomy on 06-29-19     BP 146/80   Pulse 58   Temp 36.6 C (97.8 F)   Ht 157.5 cm (5' 2" )   Wt 73.5 kg (162 lb)   SpO2 97%   BMI 29.63 kg/m       Past Medical History:  Diagnosis Date  . Abdominal hernia   . Allergic rhinitis   . Breast cancer of upper-outer quadrant of left female breast (CMS-HCC) 06/29/2019   28 mm histologic grade 3, T2, N0; ER/PR positive, HER-2/neu not overexpressed.  Oncotype DX score 13, 4% chance of recurrence in 9 years, less than 1% benefit from adjuvant chemotherapy.  . Colon polyp   . Colon polyps   . Diverticulosis 12/26/2014  . Herpes zoster   . History of cancer   . History of hearing loss           Past Surgical History:  Procedure Laterality Date  . CESAREAN SECTION    . COLONOSCOPY  12/26/2014   Diverticulosis/Negative biopsy/PHx of colon polyps/Repeat 75yr/MUS  . hammer toe surgery  2018  . MASTECTOMY PARTIAL / LUMPECTOMY Left 06/29/2019  . TUBAL LIGATION                OB History    Gravida  1   Para  1   Term      Preterm      AB      Living        SAB      TAB      Ectopic      Molar      Multiple      Live Births          Obstetric Comments  Age first period 89Age first pregnancy 89Age of menopause 89       Social History          Socioeconomic History  . Marital status: Married    Spouse  name: Not on file  . Number of children: Not on file  . Years of education: Not on file  . Highest education level: Not on file  Occupational History  . Not on file  Tobacco Use  . Smoking status: Former Smoker    Quit date: 12/19/1983    Years since quitting: 35.7  . Smokeless tobacco: Never Used  Substance and Sexual Activity  . Alcohol use: No  . Drug use: No  . Sexual activity: Defer  Other Topics Concern  . Not on file  Social History Narrative  . Not on file   Social Determinants of Health      Financial Resource Strain:   . Difficulty of Paying Living Expenses:   Food Insecurity:   . Worried About RCharity fundraiserin the Last Year:   . RArboriculturistin the Last Year:   Transportation Needs:   .  Lack of Transportation (Medical):   Marland Kitchen Lack of Transportation (Non-Medical):             Allergies  Allergen Reactions  . Aspirin Other (See Comments)    BURN STOMACH  . Chicken Derived Dizziness    Dizziness  . Chicken Meat Extract Dizziness    Dizziness  . Codeine Nausea And Vomiting  . Naproxen Sodium Other (See Comments)    BURN STOMACH BURN STOMACH    Current Medications        Current Outpatient Medications  Medication Sig Dispense Refill  . anastrozole (ARIMIDEX) 1 mg tablet Take 1 mg by mouth once daily    . calcium carbonate-vitamin D3 (CALTRATE 600+D) 600 mg(1,536m) -400 unit tablet Take 1 tablet by mouth 2 (two) times daily with meals    . cetirizine (ZYRTEC) 10 MG tablet Take by mouth once as needed       . fluticasone propionate (FLONASE) 50 mcg/actuation nasal spray Place 2 sprays into both nostrils once daily as needed for Rhinitis     No current facility-administered medications for this visit.           Family History  Problem Relation Age of Onset  . Alzheimer's disease Mother   . Dementia Mother   . Dementia Father   . High blood pressure (Hypertension) Father   . Lung cancer Father   . Heart  disease Father   . Breast cancer Sister   . Colon polyps Neg Hx   . Colon cancer Neg Hx   . Rectal cancer Neg Hx          Objective:   Physical Exam Constitutional:      Appearance: Normal appearance.  Cardiovascular:     Rate and Rhythm: Normal rate and regular rhythm.     Pulses: Normal pulses.     Heart sounds: Normal heart sounds.  Pulmonary:     Effort: Pulmonary effort is normal.     Breath sounds: Normal breath sounds.  Chest:     Breasts:        Right: Normal.        Left: Skin change present.       Comments: Left lumpectomy Radiation changes Musculoskeletal:     Cervical back: Neck supple.  Lymphadenopathy:     Upper Body:     Right upper body: No axillary adenopathy.     Left upper body: No axillary adenopathy.  Skin:    General: Skin is warm and dry.  Neurological:     Mental Status: She is alert and oriented to person, place, and time.  Psychiatric:        Mood and Affect: Mood normal.        Behavior: Behavior normal.    Labs  and Radiology:    [facility] completed by MRuby Cola MD.  This was for personal history of polyps.  Examination at that time was notable for an area of focal glandular hyperplasia in the descending colon.  A repeat exam in December 2021 was recommended.  The patient desires to have this completed through our office.    Assessment:     Doing well status post donut mastopexy and whole breast radiation.  Candidate for follow-up colonoscopy December 2021.    Plan:     The patient has started her anastrozole prescription.  She was encouraged to take this for at least 1 month before making any changes, anticipating  any vasomotor symptoms or muscle aches will significantly resolve over the first 3-4 weeks of therapy.   Follow up in 3 months, and we will go ahead and schedule a colonoscopy for the end of the year.     Entered by Karie Fetch,  RN, acting as a scribe for Dr. Hervey Ard, MD.  The documentation recorded by the scribe accurately reflects the service I personally performed and the decisions made by me.   Robert Bellow, MD FACS

## 2019-10-09 ENCOUNTER — Encounter: Payer: Self-pay | Admitting: *Deleted

## 2019-10-29 ENCOUNTER — Ambulatory Visit: Payer: PRIVATE HEALTH INSURANCE | Admitting: Radiation Oncology

## 2019-11-09 ENCOUNTER — Ambulatory Visit: Payer: PRIVATE HEALTH INSURANCE | Admitting: Radiation Oncology

## 2019-11-09 ENCOUNTER — Inpatient Hospital Stay: Payer: Medicare Other | Attending: Oncology

## 2019-11-09 ENCOUNTER — Other Ambulatory Visit: Payer: Self-pay

## 2019-11-09 DIAGNOSIS — Z170 Estrogen receptor positive status [ER+]: Secondary | ICD-10-CM | POA: Diagnosis not present

## 2019-11-09 DIAGNOSIS — Z923 Personal history of irradiation: Secondary | ICD-10-CM | POA: Diagnosis not present

## 2019-11-09 DIAGNOSIS — Z08 Encounter for follow-up examination after completed treatment for malignant neoplasm: Secondary | ICD-10-CM

## 2019-11-09 DIAGNOSIS — Z79811 Long term (current) use of aromatase inhibitors: Secondary | ICD-10-CM | POA: Insufficient documentation

## 2019-11-09 DIAGNOSIS — C50912 Malignant neoplasm of unspecified site of left female breast: Secondary | ICD-10-CM | POA: Diagnosis not present

## 2019-11-09 LAB — COMPREHENSIVE METABOLIC PANEL
ALT: 17 U/L (ref 0–44)
AST: 21 U/L (ref 15–41)
Albumin: 4.1 g/dL (ref 3.5–5.0)
Alkaline Phosphatase: 62 U/L (ref 38–126)
Anion gap: 8 (ref 5–15)
BUN: 21 mg/dL (ref 8–23)
CO2: 27 mmol/L (ref 22–32)
Calcium: 8.8 mg/dL — ABNORMAL LOW (ref 8.9–10.3)
Chloride: 102 mmol/L (ref 98–111)
Creatinine, Ser: 0.64 mg/dL (ref 0.44–1.00)
GFR, Estimated: >60 mL/min (ref >60)
Glucose, Bld: 126 mg/dL — ABNORMAL HIGH (ref 70–99)
Potassium: 4.1 mmol/L (ref 3.5–5.1)
Sodium: 137 mmol/L (ref 135–145)
Total Bilirubin: 0.7 mg/dL (ref 0.3–1.2)
Total Protein: 7 g/dL (ref 6.5–8.1)

## 2019-11-12 ENCOUNTER — Encounter: Payer: Self-pay | Admitting: Oncology

## 2019-11-12 ENCOUNTER — Inpatient Hospital Stay (HOSPITAL_BASED_OUTPATIENT_CLINIC_OR_DEPARTMENT_OTHER): Payer: Medicare Other | Admitting: Oncology

## 2019-11-12 ENCOUNTER — Ambulatory Visit
Admission: RE | Admit: 2019-11-12 | Discharge: 2019-11-12 | Disposition: A | Discharge status: Home / Self Care | Payer: Medicare Other | Source: Ambulatory Visit | Attending: Radiation Oncology | Admitting: Radiation Oncology

## 2019-11-12 ENCOUNTER — Other Ambulatory Visit: Payer: Self-pay

## 2019-11-12 VITALS — BP 143/74 | HR 59 | Temp 99.1°F | Wt 161.0 lb

## 2019-11-12 DIAGNOSIS — Z170 Estrogen receptor positive status [ER+]: Secondary | ICD-10-CM | POA: Insufficient documentation

## 2019-11-12 DIAGNOSIS — Z923 Personal history of irradiation: Secondary | ICD-10-CM | POA: Insufficient documentation

## 2019-11-12 DIAGNOSIS — Z79811 Long term (current) use of aromatase inhibitors: Secondary | ICD-10-CM

## 2019-11-12 DIAGNOSIS — C50912 Malignant neoplasm of unspecified site of left female breast: Secondary | ICD-10-CM | POA: Diagnosis not present

## 2019-11-12 DIAGNOSIS — Z08 Encounter for follow-up examination after completed treatment for malignant neoplasm: Secondary | ICD-10-CM

## 2019-11-12 DIAGNOSIS — C50919 Malignant neoplasm of unspecified site of unspecified female breast: Secondary | ICD-10-CM

## 2019-11-12 DIAGNOSIS — Z5181 Encounter for therapeutic drug level monitoring: Secondary | ICD-10-CM

## 2019-11-12 DIAGNOSIS — Z853 Personal history of malignant neoplasm of breast: Secondary | ICD-10-CM

## 2019-11-12 NOTE — Progress Notes (Signed)
Radiation Oncology Follow up Note  Name: Elizabeth Morgan   Date:   11/12/2019 MRN:  [id number] DOB: 06-20-1950    This 69 y.o. female presents to the clinic today for 1 month follow-up status post whole breast radiation to her left breast for stage T2 N0 M0 ER/PR positive HER-2 negative invasive mammary carcinoma status post wide local excision.  REFERRING PROVIDER: Jerrol Morgan.,*  HPI: Patient is a 69 year old female now out 1 month having pleated whole breast radiation to her left breast status post wide local excision for a T2N0 lesion.  Seen today in routine follow-up she is doing well.  She specifically denies breast tenderness cough or bone pain..  She has been started on Arimidex tolerant well without side effect.  COMPLICATIONS OF TREATMENT: none  FOLLOW UP COMPLIANCE: keeps appointments   PHYSICAL EXAM:  BP (!) 143/74    Pulse (!) 59    Temp 99.1 F (37.3 C) (Tympanic)    Wt 161 lb (73 kg)    BMI 29.45 kg/m  Lungs are clear to A&P cardiac examination essentially unremarkable with regular rate and rhythm. No dominant mass or nodularity is noted in either breast in 2 positions examined. Incision is well-healed. No axillary or supraclavicular adenopathy is appreciated. Cosmetic result is excellent.  There is still some sligh erythema of the skin.  Well-developed well-nourished patient in NAD. HEENT reveals PERLA, EOMI, discs not visualized.  Oral cavity is clear. No oral mucosal lesions are identified. Neck is clear without evidence of cervical or supraclavicular adenopathy. Lungs are clear to A&P. Cardiac examination is essentially unremarkable with regular rate and rhythm without murmur rub or thrill. Abdomen is benign with no organomegaly or masses noted. Motor sensory and DTR levels are equal and symmetric in the upper and lower extremities. Cranial nerves II through XII are grossly intact. Proprioception is intact. No peripheral adenopathy or edema is identified. No motor  or sensory levels are noted. Crude visual fields are within normal range.  RADIOLOGY RESULTS: No current films to review  PLAN: Present time patient is doing well 1 month out from whole breast radiation and pleased with her overall progress.  She is been started on Arimidex and is tolerating it well without side effect.  I have asked to see her back in 4 to 5 months for follow-up.  Patient knows to call with any concerns.  I would like to take this opportunity to thank you for allowing me to participate in the care of your patient.Noreene Filbert, MD

## 2019-11-12 NOTE — Progress Notes (Signed)
Patient states she is concerned about her calcium levels dropping with recent blood work.

## 2019-11-13 DIAGNOSIS — Z23 Encounter for immunization: Secondary | ICD-10-CM | POA: Diagnosis not present

## 2019-11-15 NOTE — Progress Notes (Signed)
I connected with Elizabeth Morgan on 11/15/19 at  2:45 PM EDT by video enabled telemedicine visit and verified that I am speaking with the correct person using two identifiers.   I discussed the limitations, risks, security and privacy concerns of performing an evaluation and management service by telemedicine and the availability of in-person appointments. I also discussed with the patient that there may be a patient responsible charge related to this service. The patient expressed understanding and agreed to proceed.  Other persons participating in the visit and their role in the encounter:  none  Patient's location:  home Provider's location:  work  Diagnosis- pathological prognostic stage Ia invasive mammary carcinoma of the left breast pT2 pN0 cM0 grade 3 ER/PR positive HER-2/neu negative s/p lumpectomy  Chief Complaint: Routine follow-up of breast cancer  History of present illness: Patient is a 69 year old female who underwent a recent screening mammogram on 05/21/2019 which showed abnormality in the left breast. This was followed by diagnostic mammogram and ultrasound which showed a 2.4 x 1.4 x 1.9 cm mass in the left breast. No lymphadenopathy seen in the left axilla. Core biopsy showed a 15 mm grade 3 invasive mammary carcinoma with focal mucinous features. ER/PR and HER-2 status was not available at the time of her visit  Patient is currently doing well for her age she does not have any significant comorbidities. She is independent of her ADLs and IADLs.Patient sister had breast cancer at the age of 22.  Final pathology showed invasive mammary carcinoma with negative margins 2.4 x 2.8 x 1.5 cm, grade 3 ER/PR positive HER-2 -1 sentinel lymph node negative for malignancy.  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  At her age this does not translate into absolute chemotherapy benefit.  Patient  therefore did not require any adjuvant chemotherapy patient is currently going through radiation treatment and will be completing that on September 25, 2019  Interval history: Patient is tolerating Arimidex along with calcium and vitamin D well without any significant side effects.  Denies any joint aches and pains hot flashes or mood swings   Review of Systems  Constitutional: Negative for chills, fever, malaise/fatigue and weight loss.  HENT: Negative for congestion, ear discharge and nosebleeds.   Eyes: Negative for blurred vision.  Respiratory: Negative for cough, hemoptysis, sputum production, shortness of breath and wheezing.   Cardiovascular: Negative for chest pain, palpitations, orthopnea and claudication.  Gastrointestinal: Negative for abdominal pain, blood in stool, constipation, diarrhea, heartburn, melena, nausea and vomiting.  Genitourinary: Negative for dysuria, flank pain, frequency, hematuria and urgency.  Musculoskeletal: Negative for back pain, joint pain and myalgias.  Skin: Negative for rash.  Neurological: Negative for dizziness, tingling, focal weakness, seizures, weakness and headaches.  Endo/Heme/Allergies: Does not bruise/bleed easily.  Psychiatric/Behavioral: Negative for depression and suicidal ideas. The patient does not have insomnia.     Allergies  Allergen Reactions  . Aleve [Naproxen Sodium] Other (See Comments)    BURN STOMACH  . Aspirin Other (See Comments)    BURN STOMACH  . Chicken Protein     Dizziness  . Codeine Nausea And Vomiting  . Naproxen Other (See Comments)    Stomach burns     Past Medical History:  Diagnosis Date  . Allergy   . Breast cancer ([place])   . Cancer [facility])    left breast  . Family history of breast cancer   . Family history  of lung cancer   . Hearing loss   . Hernia, abdominal    showed up end of April 2018  . History of herpes zoster 11/05/2015  . Seasonal allergies     Past Surgical History:  Procedure  Laterality Date  . ARTHRODESIS METATARSALPHALANGEAL JOINT (MTPJ) Left 06/03/2016   Procedure: ARTHRODESIS METATARSALPHALANGEAL JOINT (MTPJ);  Surgeon: Albertine Patricia, DPM;  Location: [place];  Service: Podiatry;  Laterality: Left;  . BREAST LUMPECTOMY WITH SENTINEL LYMPH NODE BIOPSY Left 06/29/2019   Procedure: BREAST LUMPECTOMY WITH SENTINEL LYMPH NODE BX;  Surgeon: Robert Bellow, MD;  Location: [facility];  Service: General;  Laterality: Left;  . CESAREAN SECTION    . COLONOSCOPY    . EYE SURGERY Bilateral    X 2  . HAMMER TOE SURGERY Left 06/03/2016   Procedure: HAMMER TOE CORRECTION - 2nd left toe;  Surgeon: Albertine Patricia, DPM;  Location: [place];  Service: Podiatry;  Laterality: Left;  . TUBAL LIGATION    . WEIL OSTEOTOMY Left 06/03/2016   Procedure: WEIL OSTEOTOMY  SECOND TOE;  Surgeon: Albertine Patricia, DPM;  Location: [place];  Service: Podiatry;  Laterality: Left;    Social History   Socioeconomic History  . Marital status: Married    Spouse name: Not on file  . Number of children: 1  . Years of education: Not on file  . Highest education level: Bachelor's degree (e.g., BA, AB, BS)  Occupational History  . Occupation: retired  Tobacco Use  . Smoking status: Former Smoker    Years: 10.00    Quit date: 1988    Years since quitting: 33.8  . Smokeless tobacco: Never Used  . Tobacco comment: Quit about 30 years ago  Vaping Use  . Vaping Use: Never used  Substance and Sexual Activity  . Alcohol use: Yes    Comment: occasionally, 1x/month  . Drug use: No  . Sexual activity: Never  Other Topics Concern  . Not on file  Social History Narrative  . Not on file   Social Determinants of Health   Financial Resource Strain: Low Risk   . Difficulty of Paying Living Expenses: Not hard at all  Food Insecurity: No Food Insecurity  . Worried About Charity fundraiser in the Last Year: Never true  . Ran Out of Food in the Last Year: Never  true  Transportation Needs: No Transportation Needs  . Lack of Transportation (Medical): No  . Lack of Transportation (Non-Medical): No  Physical Activity: Inactive  . Days of Exercise per Week: 0 days  . Minutes of Exercise per Session: 0 min  Stress: No Stress Concern Present  . Feeling of Stress : Not at all  Social Connections: Moderately Integrated  . Frequency of Communication with Friends and Family: More than three times a week  . Frequency of Social Gatherings with Friends and Family: Never  . Attends Religious Services: More than 4 times per year  . Active Member of Clubs or Organizations: No  . Attends Archivist Meetings: Never  . Marital Status: Married  Human resources officer Violence: Not At Risk  . Fear of Current or Ex-Partner: No  . Emotionally Abused: No  . Physically Abused: No  . Sexually Abused: No    Family History  Problem Relation Age of Onset  . Dementia Mother   . Dementia Father   . Hypertension Father   . Heart disease Father   . Lung cancer Father   .  Breast cancer Sister 34  . Lung cancer Maternal Aunt   . Lung cancer Maternal Uncle      Current Outpatient Medications:  .  anastrozole (ARIMIDEX) 1 MG tablet, Take 1 tablet (1 mg total) by mouth daily., Disp: 30 tablet, Rfl: 3 .  cetirizine (ZYRTEC ALLERGY) 10 MG tablet, Take 10 mg by mouth daily as needed (allergies). , Disp: , Rfl:  .  fluticasone (FLONASE) 50 MCG/ACT nasal spray, Place 1 spray into both nostrils daily as needed for allergies or rhinitis., Disp: , Rfl:   No results found.  No images are attached to the encounter.   CMP Latest Ref Rng & Units 11/09/2019  Glucose 70 - 99 mg/dL 126(H)  BUN 8 - 23 mg/dL 21  Creatinine 0.44 - 1.00 mg/dL 0.64  Sodium 135 - 145 mmol/L 137  Potassium 3.5 - 5.1 mmol/L 4.1  Chloride 98 - 111 mmol/L 102  CO2 22 - 32 mmol/L 27  Calcium 8.9 - 10.3 mg/dL 8.8(L)  Total Protein 6.5 - 8.1 g/dL 7.0  Total Bilirubin 0.3 - 1.2 mg/dL 0.7   Alkaline Phos 38 - 126 U/L 62  AST 15 - 41 U/L 21  ALT 0 - 44 U/L 17   CBC Latest Ref Rng & Units 09/17/2019  WBC 4.0 - 10.5 K/uL 6.9  Hemoglobin 12.0 - 15.0 g/dL 13.2  Hematocrit 36 - 46 % 37.2  Platelets 150 - 400 K/uL 220     Observation/objective: Appears in no acute distress over video visit today.  Breathing is nonlabored  Assessment and plan: Patient is a 69 year old female with pathological prognostic stage Ia left breast cancer ER/PR positive HER-2/neu negative s/p lumpectomy T2 N0 M0 grade 3 with Oncotype score of 13 and did not require any adjuvant chemotherapy.  She completed adjuvant radiation therapy and is currently on Arimidex and this is a routine follow-up visit  Patient is doing well overall and tolerating Arimidex along with calcium and vitamin D well.  Reports no significant side effects at this time.  She will need repeat mammogram in May 2021 which will be coordinated by Dr. Bary Castilla.  Follow-up instructions: I will see her back in February in person no labs I discussed the assessment and treatment plan with the patient. The patient was provided an opportunity to ask questions and all were answered. The patient agreed with the plan and demonstrated an understanding of the instructions.   The patient was advised to call back or seek an in-person evaluation if the symptoms worsen or if the condition fails to improve as anticipated.  Visit Diagnosis: 1. Visit for monitoring Arimidex therapy   2. Encounter for follow-up surveillance of breast cancer     Dr. Randa Evens, MD, MPH [facility] Tel- [phone] 11/15/2019 1:31 PM

## 2019-11-28 ENCOUNTER — Other Ambulatory Visit: Payer: Self-pay

## 2019-11-28 ENCOUNTER — Ambulatory Visit (INDEPENDENT_AMBULATORY_CARE_PROVIDER_SITE_OTHER): Payer: Medicare Other | Admitting: Dermatology

## 2019-11-28 DIAGNOSIS — L821 Other seborrheic keratosis: Secondary | ICD-10-CM

## 2019-11-28 DIAGNOSIS — D229 Melanocytic nevi, unspecified: Secondary | ICD-10-CM

## 2019-11-28 DIAGNOSIS — Z1283 Encounter for screening for malignant neoplasm of skin: Secondary | ICD-10-CM | POA: Diagnosis not present

## 2019-11-28 DIAGNOSIS — L814 Other melanin hyperpigmentation: Secondary | ICD-10-CM

## 2019-11-28 DIAGNOSIS — L578 Other skin changes due to chronic exposure to nonionizing radiation: Secondary | ICD-10-CM

## 2019-11-28 DIAGNOSIS — L570 Actinic keratosis: Secondary | ICD-10-CM | POA: Diagnosis not present

## 2019-11-28 DIAGNOSIS — D1800 Hemangioma unspecified site: Secondary | ICD-10-CM | POA: Diagnosis not present

## 2019-11-28 DIAGNOSIS — L720 Epidermal cyst: Secondary | ICD-10-CM | POA: Diagnosis not present

## 2019-11-28 NOTE — Progress Notes (Signed)
   Follow-Up Visit   Subjective  Elizabeth Morgan is a 69 y.o. female who presents for the following: Annual Exam (TBSE today. Hx of AK, left cheek. ).  Pt states that she has a bump on her back and a few spots on her face that she would like checked.   The following portions of the chart were reviewed this encounter and updated as appropriate: Tobacco  Allergies  Meds  Problems  Med Hx  Surg Hx  Fam Hx      Review of Systems: No other skin or systemic complaints except as noted in HPI or Assessment and Plan.   Objective  Well appearing patient in no apparent distress; mood and affect are within normal limits.  A full examination was performed including scalp, head, eyes, ears, nose, lips, neck, chest, axillae, abdomen, back, buttocks, bilateral upper extremities, bilateral lower extremities, hands, feet, fingers, toes, fingernails, and toenails. All findings within normal limits unless otherwise noted below.  Objective  Upper Mid Back: Subcutaneous nodule.   Objective  left temple x 1, mid forehead x 1, right forehead x 1, right zygoma x 1, left nasal dorsum x 1 (5): Erythematous thin papules/macules with gritty scale.   Assessment & Plan  Epidermal cyst Upper Mid Back  Benign-appearing.  Observation.  Call clinic for new or changing lesions. Discussed option of excision if bothersome.   AK (actinic keratosis) (5) left temple x 1, mid forehead x 1, right forehead x 1, right zygoma x 1, left nasal dorsum x 1  Prior to procedure, discussed risks of blister formation, small wound, skin dyspigmentation, or rare scar following cryotherapy.   Recommend daily broad spectrum sunscreen SPF 30+ to sun-exposed areas, reapply every 2 hours as needed. Call for new or changing lesions.   Destruction of lesion - left temple x 1, mid forehead x 1, right forehead x 1, right zygoma x 1, left nasal dorsum x 1  Destruction method: cryotherapy   Informed consent: discussed and consent  obtained   Lesion destroyed using liquid nitrogen: Yes   Cryotherapy cycles:  2 Outcome: patient tolerated procedure well with no complications   Post-procedure details: wound care instructions given     Lentigines - Scattered tan macules - Discussed due to sun exposure - Benign, observe - Call for any changes  Seborrheic Keratoses - Stuck-on, waxy, tan-brown papules and plaques  - Discussed benign etiology and prognosis. - Observe - Call for any changes  Melanocytic Nevi - Tan-brown and/or pink-flesh-colored symmetric macules and papules - Benign appearing on exam today - Observation - Call clinic for new or changing moles - Recommend daily use of broad spectrum spf 30+ sunscreen to sun-exposed areas.   Hemangiomas - Red papules - Discussed benign nature - Observe - Call for any changes  Actinic Damage - Chronic, secondary to cumulative UV/sun exposure - diffuse scaly erythematous macules with underlying dyspigmentation - Recommend daily broad spectrum sunscreen SPF 30+ to sun-exposed areas, reapply every 2 hours as needed.  - Call for new or changing lesions.  Skin cancer screening performed today.  Return in about 3 months (around 02/28/2020) for 2-3 month AK f/u and 1 year TBSE.   I, Harriett Sine, CMA, am acting as scribe for Forest Gleason, MD.  Documentation: I have reviewed the above documentation for accuracy and completeness, and I agree with the above.  Forest Gleason, MD

## 2019-12-10 ENCOUNTER — Encounter: Payer: Self-pay | Admitting: Dermatology

## 2019-12-11 ENCOUNTER — Other Ambulatory Visit: Payer: Self-pay | Admitting: Oncology

## 2019-12-27 ENCOUNTER — Other Ambulatory Visit: Payer: Self-pay | Admitting: General Surgery

## 2019-12-27 DIAGNOSIS — Z853 Personal history of malignant neoplasm of breast: Secondary | ICD-10-CM | POA: Diagnosis not present

## 2019-12-27 DIAGNOSIS — Z86010 Personal history of colonic polyps: Secondary | ICD-10-CM | POA: Diagnosis not present

## 2019-12-27 NOTE — Progress Notes (Signed)
Subjective:     Patient ID: Elizabeth Morgan is a 69 y.o. female.  HPI  The following portions of the patient's history were reviewed and updated as appropriate.  This an established patient is here today for: office visit. Here for her left breast cancer follow up and colonoscopy for 01-04-20. She states the breast is still a little tender.  Review of Systems  Constitutional: Negative for chills and fever.  Respiratory: Negative for cough.         Chief Complaint  Patient presents with  . Follow-up    breast cancer  . Pre-op Exam    colonoscopy     BP 132/74   Pulse 78   Temp 36.7 C (98 F)   Ht 158.8 cm (5' 2.5")   Wt 74.4 kg (164 lb)   SpO2 99%   BMI 29.52 kg/m       Past Medical History:  Diagnosis Date  . Abdominal hernia   . Allergic rhinitis   . Breast cancer of upper-outer quadrant of left female breast (CMS-HCC) 06/29/2019   28 mm histologic grade 3, T2, N0; ER/PR positive, HER-2/neu not overexpressed.  Oncotype DX score 13, 4% chance of recurrence in 9 years, less than 1% benefit from adjuvant chemotherapy.  . Colon polyp   . Colon polyps   . Diverticulosis 12/26/2014  . Herpes zoster   . History of cancer   . History of hearing loss           Past Surgical History:  Procedure Laterality Date  . CESAREAN SECTION    . COLONOSCOPY  12/26/2014   Diverticulosis/Negative biopsy/PHx of colon polyps/Repeat 89yr/MUS  . hammer toe surgery  2018  . MASTECTOMY PARTIAL / LUMPECTOMY Left 06/29/2019  . TUBAL LIGATION                OB History    Gravida  1   Para  1   Term      Preterm      AB      Living        SAB      IAB      Ectopic      Molar      Multiple      Live Births          Obstetric Comments  Age first period 89Age first pregnancy 89Age of menopause 89       Social History          Socioeconomic History  . Marital status: Married    Spouse name: Not on file  .  Number of children: Not on file  . Years of education: Not on file  . Highest education level: Not on file  Occupational History  . Not on file  Tobacco Use  . Smoking status: Former Smoker    Quit date: 12/19/1983    Years since quitting: 36.0  . Smokeless tobacco: Never Used  Substance and Sexual Activity  . Alcohol use: No  . Drug use: No  . Sexual activity: Defer  Other Topics Concern  . Not on file  Social History Narrative  . Not on file   Social Determinants of Health   Financial Resource Strain: Not on file  Food Insecurity: Not on file  Transportation Needs: Not on file            Allergies  Allergen Reactions  . Aspirin Other (See Comments)    BURN STOMACH  .  Chicken Derived Dizziness    Dizziness  . Chicken Meat Extract Dizziness    Dizziness  . Codeine Nausea And Vomiting  . Naproxen Sodium Other (See Comments)    BURN STOMACH BURN STOMACH    Current Medications  Current Outpatient Medications  Medication Sig Dispense Refill  . anastrozole (ARIMIDEX) 1 mg tablet Take 1 mg by mouth once daily    . calcium carbonate-vitamin D3 (CALTRATE 600+D) 600 mg(1,551m) -400 unit tablet Take 1 tablet by mouth 2 (two) times daily with meals    . cetirizine (ZYRTEC) 10 MG tablet Take by mouth once as needed       . fluticasone propionate (FLONASE) 50 mcg/actuation nasal spray Place 2 sprays into both nostrils once daily as needed for Rhinitis     No current facility-administered medications for this visit.           Family History  Problem Relation Age of Onset  . Alzheimer's disease Mother   . Dementia Mother   . Dementia Father   . High blood pressure (Hypertension) Father   . Lung cancer Father   . Heart disease Father   . Breast cancer Sister   . Colon polyps Neg Hx   . Colon cancer Neg Hx   . Rectal cancer Neg Hx      Labs and Radiology:   Bone density completed May 21, 2019: Normal.    Objective:    Physical Exam Exam conducted with a chaperone present.  Constitutional:      Appearance: Normal appearance.  Cardiovascular:     Rate and Rhythm: Normal rate and regular rhythm.     Pulses: Normal pulses.     Heart sounds: Normal heart sounds.  Pulmonary:     Effort: Pulmonary effort is normal.     Breath sounds: Normal breath sounds.  Chest:       Comments: Left lumpectomy  Musculoskeletal:     Cervical back: Neck supple.  Skin:    General: Skin is warm and dry.  Neurological:     Mental Status: She is alert and oriented to person, place, and time.  Psychiatric:        Mood and Affect: Mood normal.        Behavior: Behavior normal.        Assessment:     Doing well post excision left breast T2, N0 tumor.  History of colonic polyp in 2005, pathology not available.    Plan:     Indications for repeat colonoscopy reviewed.  Risks associated with the procedure reviewed.  If normal, will likely defer repeat exam for 7-9 years.  Colonoscopy as scheduled.     Patient to return in May 2022 with a bilateral diagnostic mammogram.   Entered by MKarie Fetch RN, acting as a scribe for Dr. JHervey Ard MD.  The documentation recorded by the scribe accurately reflects the service I personally performed and the decisions made by me.   JRobert Bellow MD FACS

## 2020-01-02 ENCOUNTER — Other Ambulatory Visit
Admission: RE | Admit: 2020-01-02 | Discharge: 2020-01-02 | Disposition: A | Discharge status: Home / Self Care | Payer: Medicare Other | Source: Ambulatory Visit | Attending: General Surgery | Admitting: General Surgery

## 2020-01-02 DIAGNOSIS — Z01812 Encounter for preprocedural laboratory examination: Secondary | ICD-10-CM | POA: Insufficient documentation

## 2020-01-02 DIAGNOSIS — Z20822 Contact with and (suspected) exposure to covid-19: Secondary | ICD-10-CM | POA: Insufficient documentation

## 2020-01-02 LAB — SARS CORONAVIRUS 2 (TAT 6-24 HRS): SARS Coronavirus 2: NEGATIVE

## 2020-01-03 ENCOUNTER — Encounter: Payer: Self-pay | Admitting: General Surgery

## 2020-01-04 ENCOUNTER — Ambulatory Visit: Payer: Medicare Other | Admitting: Registered Nurse

## 2020-01-04 ENCOUNTER — Encounter: Payer: Self-pay | Admitting: General Surgery

## 2020-01-04 ENCOUNTER — Ambulatory Visit
Admission: RE | Admit: 2020-01-04 | Discharge: 2020-01-04 | Disposition: A | Discharge status: Home / Self Care | Payer: Medicare Other | Attending: General Surgery | Admitting: General Surgery

## 2020-01-04 ENCOUNTER — Other Ambulatory Visit: Payer: Self-pay

## 2020-01-04 ENCOUNTER — Encounter
Admission: RE | Disposition: A | Discharge status: Home / Self Care | Payer: Self-pay | Source: Home / Self Care | Attending: General Surgery

## 2020-01-04 DIAGNOSIS — Z86010 Personal history of colonic polyps: Secondary | ICD-10-CM | POA: Diagnosis not present

## 2020-01-04 DIAGNOSIS — Z801 Family history of malignant neoplasm of trachea, bronchus and lung: Secondary | ICD-10-CM | POA: Diagnosis not present

## 2020-01-04 DIAGNOSIS — Z885 Allergy status to narcotic agent status: Secondary | ICD-10-CM | POA: Diagnosis not present

## 2020-01-04 DIAGNOSIS — Z886 Allergy status to analgesic agent status: Secondary | ICD-10-CM | POA: Insufficient documentation

## 2020-01-04 DIAGNOSIS — K5790 Diverticulosis of intestine, part unspecified, without perforation or abscess without bleeding: Secondary | ICD-10-CM | POA: Diagnosis not present

## 2020-01-04 DIAGNOSIS — Z1211 Encounter for screening for malignant neoplasm of colon: Secondary | ICD-10-CM | POA: Insufficient documentation

## 2020-01-04 DIAGNOSIS — K5730 Diverticulosis of large intestine without perforation or abscess without bleeding: Secondary | ICD-10-CM | POA: Insufficient documentation

## 2020-01-04 DIAGNOSIS — Z853 Personal history of malignant neoplasm of breast: Secondary | ICD-10-CM | POA: Insufficient documentation

## 2020-01-04 DIAGNOSIS — Z91018 Allergy to other foods: Secondary | ICD-10-CM | POA: Diagnosis not present

## 2020-01-04 DIAGNOSIS — Z803 Family history of malignant neoplasm of breast: Secondary | ICD-10-CM | POA: Diagnosis not present

## 2020-01-04 DIAGNOSIS — Z888 Allergy status to other drugs, medicaments and biological substances status: Secondary | ICD-10-CM | POA: Insufficient documentation

## 2020-01-04 DIAGNOSIS — Z87891 Personal history of nicotine dependence: Secondary | ICD-10-CM | POA: Insufficient documentation

## 2020-01-04 HISTORY — PX: COLONOSCOPY WITH PROPOFOL: SHX5780

## 2020-01-04 SURGERY — COLONOSCOPY WITH PROPOFOL
Anesthesia: General

## 2020-01-04 MED ORDER — LIDOCAINE HCL (PF) 2 % IJ SOLN
INTRAMUSCULAR | Status: AC
  Filled 2020-01-04: qty 15

## 2020-01-04 MED ORDER — PROPOFOL 500 MG/50ML IV EMUL
INTRAVENOUS | Status: AC
  Filled 2020-01-04: qty 150

## 2020-01-04 MED ORDER — SODIUM CHLORIDE 0.9 % IV SOLN: INTRAVENOUS | Status: DC

## 2020-01-04 MED ORDER — PROPOFOL 10 MG/ML IV BOLUS
INTRAVENOUS | Status: DC | PRN
  Administered 2020-01-04: 70 mg via INTRAVENOUS

## 2020-01-04 MED ORDER — LIDOCAINE HCL (CARDIAC) PF 100 MG/5ML IV SOSY
PREFILLED_SYRINGE | INTRAVENOUS | Status: DC | PRN
  Administered 2020-01-04: 60 mg via INTRAVENOUS

## 2020-01-04 MED ORDER — PROPOFOL 500 MG/50ML IV EMUL
INTRAVENOUS | Status: DC | PRN
  Administered 2020-01-04: 150 ug/kg/min via INTRAVENOUS

## 2020-01-04 NOTE — Anesthesia Postprocedure Evaluation (Signed)
Anesthesia Post Note  Patient: Elizabeth Morgan  Procedure(s) Performed: COLONOSCOPY WITH PROPOFOL (N/A )  Patient location during evaluation: Endoscopy Anesthesia Type: General Level of consciousness: awake and alert Pain management: pain level controlled Vital Signs Assessment: post-procedure vital signs reviewed and stable Respiratory status: spontaneous breathing, nonlabored ventilation, respiratory function stable and patient connected to nasal cannula oxygen Cardiovascular status: blood pressure returned to baseline and stable Postop Assessment: no apparent nausea or vomiting Anesthetic complications: no   No complications documented.   Last Vitals:  Vitals:   01/04/20 0812 01/04/20 0820  BP: (!) 144/72 (!) 161/77  Pulse: 65 60  Resp: 15 19  Temp:    SpO2: 98% 100%    Last Pain:  Vitals:   01/04/20 0729  TempSrc: Temporal  PainSc: 0-No pain                 Precious Haws Willem Klingensmith

## 2020-01-04 NOTE — Anesthesia Preprocedure Evaluation (Signed)
Anesthesia Evaluation  Patient identified by MRN, date of birth, ID band Patient awake    Reviewed: Allergy & Precautions, H&P , NPO status , Patient's Chart, lab work & pertinent test results  History of Anesthesia Complications Negative for: history of anesthetic complications  Airway Mallampati: III  TM Distance: >3 FB Neck ROM: limited    Dental  (+) Chipped   Pulmonary neg shortness of breath, former smoker,    Pulmonary exam normal        Cardiovascular Exercise Tolerance: Good hypertension, Normal cardiovascular exam     Neuro/Psych  Headaches, negative psych ROS   GI/Hepatic Neg liver ROS, GERD  Medicated and Controlled,  Endo/Other  negative endocrine ROS  Renal/GU negative Renal ROS  negative genitourinary   Musculoskeletal   Abdominal   Peds  Hematology negative hematology ROS (+)   Anesthesia Other Findings Past Medical History: No date: Allergy No date: Breast cancer [facility]) No date: Cancer [facility])     Comment:  left breast No date: Family history of breast cancer No date: Family history of lung cancer No date: Hearing loss No date: Hernia, abdominal     Comment:  showed up end of April 2018 11/05/2015: History of herpes zoster No date: Seasonal allergies  Past Surgical History: 06/03/2016: ARTHRODESIS METATARSALPHALANGEAL JOINT (MTPJ); Left     Comment:  Procedure: ARTHRODESIS METATARSALPHALANGEAL JOINT               (MTPJ);  Surgeon: Albertine Patricia, DPM;  Location:               [place];  Service: Podiatry;  Laterality:               Left; 06/29/2019: BREAST LUMPECTOMY WITH SENTINEL LYMPH NODE BIOPSY; Left     Comment:  Procedure: BREAST LUMPECTOMY WITH SENTINEL LYMPH NODE               BX;  Surgeon: Robert Bellow, MD;  Location: [facility]               ORS;  Service: General;  Laterality: Left; No date: CESAREAN SECTION No date: COLONOSCOPY No date: EYE SURGERY; Bilateral      Comment:  X 2 06/03/2016: HAMMER TOE SURGERY; Left     Comment:  Procedure: HAMMER TOE CORRECTION - 2nd left toe;                Surgeon: Albertine Patricia, DPM;  Location: [place];  Service: Podiatry;  Laterality: Left; No date: TUBAL LIGATION 06/03/2016: WEIL OSTEOTOMY; Left     Comment:  Procedure: WEIL OSTEOTOMY  SECOND TOE;  Surgeon:               Albertine Patricia, DPM;  Location: [place];                Service: Podiatry;  Laterality: Left;     Reproductive/Obstetrics negative OB ROS                             Anesthesia Physical Anesthesia Plan  ASA: II  Anesthesia Plan: General   Post-op Pain Management:    Induction: Intravenous  PONV Risk Score and Plan: Propofol infusion and TIVA  Airway Management Planned: Natural Airway and Nasal Cannula  Additional Equipment:   Intra-op Plan:   Post-operative Plan:   Informed Consent: I  have reviewed the patients History and Physical, chart, labs and discussed the procedure including the risks, benefits and alternatives for the proposed anesthesia with the patient or authorized representative who has indicated his/her understanding and acceptance.     Dental Advisory Given  Plan Discussed with: Anesthesiologist, CRNA and Surgeon  Anesthesia Plan Comments: (Patient consented for risks of anesthesia including but not limited to:  - adverse reactions to medications - risk of airway placement if required - damage to eyes, teeth, lips or other oral mucosa - nerve damage due to positioning  - sore throat or hoarseness - Damage to heart, brain, nerves, lungs, other parts of body or loss of life  Patient voiced understanding.)        Anesthesia Quick Evaluation

## 2020-01-04 NOTE — Op Note (Addendum)
[facility] Gastroenterology Patient Name: Elizabeth Morgan Procedure Date: 01/04/2020 7:39 AM MRN: [id number] Account #: [id number] Date of Birth: 11-Feb-1950 Admit Type: Outpatient Age: 69 Room: [facility] ENDO ROOM 1 Gender: Female Note Status: Supervisor Override Procedure:             Colonoscopy Indications:           Personal history of colonic polyps Providers:             Robert Bellow, MD Medicines:             Monitored Anesthesia Care Complications:         No immediate complications. Procedure:             Pre-Anesthesia Assessment:                        - Prior to the procedure, a History and Physical was                         performed, and patient medications, allergies and                         sensitivities were reviewed. The patient's tolerance                         of previous anesthesia was reviewed.                        - The risks and benefits of the procedure and the                         sedation options and risks were discussed with the                         patient. All questions were answered and informed                         consent was obtained.                        After obtaining informed consent, the colonoscope was                         passed under direct vision. Throughout the procedure,                         the patient's blood pressure, pulse, and oxygen                         saturations were monitored continuously. The                         Colonoscope was introduced through the anus and                         advanced to the the cecum, identified by appendiceal                         orifice and ileocecal valve. The colonoscopy was  performed without difficulty. The patient tolerated                         the procedure well. The quality of the bowel                         preparation was excellent. Findings:      A single large-mouthed diverticulum was found in the sigmoid  colon.      The retroflexed view of the distal rectum and anal verge was normal and       showed no anal or rectal abnormalities. Impression:            - Diverticulosis in the sigmoid colon.                        - The distal rectum and anal verge are normal on                         retroflexion view.                        - No specimens collected. Recommendation:        - Repeat colonoscopy in 8 years for surveillance. Procedure Code(s):     --- Professional ---                        [phone], Colonoscopy, flexible; diagnostic, including                         collection of specimen(s) by brushing or washing, when                         performed (separate procedure) Diagnosis Code(s):     --- Professional ---                        Z12.11, Encounter for screening for malignant neoplasm                         of colon                        K57.30, Diverticulosis of large intestine without                         perforation or abscess without bleeding CPT copyright 2019 American Medical Association. All rights reserved. The codes documented in this report are preliminary and upon coder review may  be revised to meet current compliance requirements. Robert Bellow, MD 01/04/2020 8:12:16 AM This report has been signed electronically. Number of Addenda: 0 Note Initiated On: 01/04/2020 7:39 AM Scope Withdrawal Time: 0 hours 7 minutes 40 seconds  Total Procedure Duration: 0 hours 12 minutes 6 seconds       [facility]

## 2020-01-04 NOTE — Transfer of Care (Signed)
Immediate Anesthesia Transfer of Care Note  Patient: Elizabeth Morgan  Procedure(s) Performed: COLONOSCOPY WITH PROPOFOL (N/A )  Patient Location: PACU on Endoscopy Unit  Anesthesia Type:General  Level of Consciousness: drowsy  Airway & Oxygen Therapy: Patient Spontanous Breathing  Post-op Assessment: Report given to RN and Post -op Vital signs reviewed and stable  Post vital signs: Reviewed and stable  Last Vitals:  Vitals Value Taken Time  BP 131/59 01/04/20 0802  Temp    Pulse 62 01/04/20 0802  Resp 15 01/04/20 0802  SpO2 96 % 01/04/20 0802    Last Pain:  Vitals:   01/04/20 0729  TempSrc: Temporal  PainSc: 0-No pain         Complications: No complications documented.

## 2020-01-04 NOTE — H&P (Signed)
Elizabeth Morgan [id number] 09-May-1950     HPI:  69 y/o woman for a screening colonoscopy. Tolerated prep well.   Medications Prior to Admission  Medication Sig Dispense Refill Last Dose  . anastrozole (ARIMIDEX) 1 MG tablet TAKE 1 TABLET BY MOUTH EVERY DAY 30 tablet 3 01/03/2020 at Unknown time  . Calcium Carbonate-Vitamin D 600-400 MG-UNIT tablet Take by mouth.   Past Week at Unknown time  . cetirizine (ZYRTEC) 10 MG tablet Take 10 mg by mouth daily as needed (allergies).    Past Week at Unknown time  . fluticasone (FLONASE) 50 MCG/ACT nasal spray Place 1 spray into both nostrils daily as needed for allergies or rhinitis.   Past Week at Unknown time   Allergies  Allergen Reactions  . Aleve [Naproxen Sodium] Other (See Comments)    BURN STOMACH  . Aspirin Other (See Comments)    BURN STOMACH  . Chicken Protein     Dizziness  . Codeine Nausea And Vomiting  . Naproxen Other (See Comments)    Stomach burns    Past Medical History:  Diagnosis Date  . Allergy   . Breast cancer ([place])   . Cancer [facility])    left breast  . Family history of breast cancer   . Family history of lung cancer   . Hearing loss   . Hernia, abdominal    showed up end of April 2018  . History of herpes zoster 11/05/2015  . Seasonal allergies    Past Surgical History:  Procedure Laterality Date  . ARTHRODESIS METATARSALPHALANGEAL JOINT (MTPJ) Left 06/03/2016   Procedure: ARTHRODESIS METATARSALPHALANGEAL JOINT (MTPJ);  Surgeon: Albertine Patricia, DPM;  Location: [place];  Service: Podiatry;  Laterality: Left;  . BREAST LUMPECTOMY WITH SENTINEL LYMPH NODE BIOPSY Left 06/29/2019   Procedure: BREAST LUMPECTOMY WITH SENTINEL LYMPH NODE BX;  Surgeon: Robert Bellow, MD;  Location: [facility] ORS;  Service: General;  Laterality: Left;  . CESAREAN SECTION    . COLONOSCOPY    . EYE SURGERY Bilateral    X 2  . HAMMER TOE SURGERY Left 06/03/2016   Procedure: HAMMER TOE CORRECTION - 2nd left toe;  Surgeon:  Albertine Patricia, DPM;  Location: [place];  Service: Podiatry;  Laterality: Left;  . TUBAL LIGATION    . WEIL OSTEOTOMY Left 06/03/2016   Procedure: WEIL OSTEOTOMY  SECOND TOE;  Surgeon: Albertine Patricia, DPM;  [place];  Service: Podiatry;  Laterality: Left;   Social History   Socioeconomic History  . Marital status: Married    Spouse name: Not on file  . Number of children: 1  . Years of education: Not on file  . Highest education level: Bachelor's degree (e.g., BA, AB, BS)  Occupational History  . Occupation: retired  Tobacco Use  . Smoking status: Former Smoker    Years: 10.00    Quit date: 1988    Years since quitting: 33.9  . Smokeless tobacco: Never Used  . Tobacco comment: Quit about 30 years ago  Vaping Use  . Vaping Use: Never used  Substance and Sexual Activity  . Alcohol use: Yes    Comment: occasionally, 1x/month  . Drug use: No  . Sexual activity: Never  Other Topics Concern  . Not on file  Social History Narrative  . Not on file   Social Determinants of Health   Financial Resource Strain: Low Risk   . Difficulty of Paying Living Expenses: Not hard at all  Food Insecurity: No  Food Insecurity  . Worried About Charity fundraiser in the Last Year: Never true  . Ran Out of Food in the Last Year: Never true  Transportation Needs: No Transportation Needs  . Lack of Transportation (Medical): No  . Lack of Transportation (Non-Medical): No  Physical Activity: Inactive  . Days of Exercise per Week: 0 days  . Minutes of Exercise per Session: 0 min  Stress: No Stress Concern Present  . Feeling of Stress : Not at all  Social Connections: Moderately Integrated  . Frequency of Communication with Friends and Family: More than three times a week  . Frequency of Social Gatherings with Friends and Family: Never  . Attends Religious Services: More than 4 times per year  . Active Member of Clubs or Organizations: No  . Attends Theatre manager Meetings: Never  . Marital Status: Married  Human resources officer Violence: Not At Risk  . Fear of Current or Ex-Partner: No  . Emotionally Abused: No  . Physically Abused: No  . Sexually Abused: No   Social History   Social History Narrative  . Not on file     ROS: Negative.     PE: HEENT: Negative. Lungs: Clear. Cardio: RR.  Assessment/Plan:  Proceed with planned endoscopy.  Forest Gleason [facility] 01/04/2020

## 2020-01-07 ENCOUNTER — Encounter: Payer: Self-pay | Admitting: General Surgery

## 2020-02-27 ENCOUNTER — Ambulatory Visit (INDEPENDENT_AMBULATORY_CARE_PROVIDER_SITE_OTHER): Payer: Medicare Other | Admitting: Dermatology

## 2020-02-27 ENCOUNTER — Other Ambulatory Visit: Payer: Self-pay

## 2020-02-27 ENCOUNTER — Encounter: Payer: Self-pay | Admitting: Dermatology

## 2020-02-27 DIAGNOSIS — L578 Other skin changes due to chronic exposure to nonionizing radiation: Secondary | ICD-10-CM | POA: Diagnosis not present

## 2020-02-27 DIAGNOSIS — L570 Actinic keratosis: Secondary | ICD-10-CM | POA: Diagnosis not present

## 2020-02-27 NOTE — Progress Notes (Signed)
   Follow-Up Visit   Subjective  Elizabeth Morgan is a 69 y.o. female who presents for the following: Follow-up (Patient here today for 3 month AK follow up at the face. Patient advises spot at left temple and mid forehead have not resolved. ).  The following portions of the chart were reviewed this encounter and updated as appropriate:   Tobacco  Allergies  Meds  Problems  Med Hx  Surg Hx  Fam Hx      Review of Systems:  No other skin or systemic complaints except as noted in HPI or Assessment and Plan.  Objective  Well appearing patient in no apparent distress; mood and affect are within normal limits.  A focused examination was performed including face. Relevant physical exam findings are noted in the Assessment and Plan.  Objective  mid forehead x 1, left temple x 1 (2): Erythematous thin papules/macules with gritty scale.    Assessment & Plan  AK (actinic keratosis) (2) mid forehead x 1, left temple x 1  Discussed option of topical therapy vs LN2. Pt prefers LN2  Prior to procedure, discussed risks of blister formation, small wound, skin dyspigmentation, or rare scar following cryotherapy.    Destruction of lesion - mid forehead x 1, left temple x 1 Complexity: simple   Destruction method: cryotherapy   Informed consent: discussed and consent obtained   Lesion destroyed using liquid nitrogen: Yes   Cryotherapy cycles:  2 Outcome: patient tolerated procedure well with no complications   Post-procedure details: wound care instructions given    Actinic Damage - chronic, secondary to cumulative UV radiation exposure/sun exposure over time - diffuse scaly erythematous macules with underlying dyspigmentation - Recommend daily broad spectrum sunscreen SPF 30+ to sun-exposed areas, reapply every 2 hours as needed.  - Call for new or changing lesions.  Return in about 2 months (around 04/26/2020) for AK follow up.  Graciella Belton, RMA, am acting as scribe for  Forest Gleason, MD .  Documentation: I have reviewed the above documentation for accuracy and completeness, and I agree with the above.  Forest Gleason, MD

## 2020-02-27 NOTE — Patient Instructions (Signed)
Cryotherapy Aftercare  . Wash gently with soap and water everyday.   . Apply Vaseline and Band-Aid daily until healed.  Prior to procedure, discussed risks of blister formation, small wound, skin dyspigmentation, or rare scar following cryotherapy.   

## 2020-03-03 ENCOUNTER — Other Ambulatory Visit: Payer: Self-pay | Admitting: Oncology

## 2020-03-04 ENCOUNTER — Ambulatory Visit: Payer: PRIVATE HEALTH INSURANCE | Admitting: Oncology

## 2020-03-04 ENCOUNTER — Other Ambulatory Visit: Payer: Self-pay

## 2020-03-04 ENCOUNTER — Inpatient Hospital Stay: Payer: Medicare Other | Attending: Oncology | Admitting: Oncology

## 2020-03-04 VITALS — BP 122/62 | HR 69 | Temp 99.2°F | Wt 163.7 lb

## 2020-03-04 DIAGNOSIS — Z5181 Encounter for therapeutic drug level monitoring: Secondary | ICD-10-CM | POA: Diagnosis not present

## 2020-03-04 DIAGNOSIS — Z853 Personal history of malignant neoplasm of breast: Secondary | ICD-10-CM | POA: Diagnosis not present

## 2020-03-04 DIAGNOSIS — Z170 Estrogen receptor positive status [ER+]: Secondary | ICD-10-CM | POA: Diagnosis not present

## 2020-03-04 DIAGNOSIS — Z79811 Long term (current) use of aromatase inhibitors: Secondary | ICD-10-CM | POA: Diagnosis not present

## 2020-03-04 DIAGNOSIS — Z08 Encounter for follow-up examination after completed treatment for malignant neoplasm: Secondary | ICD-10-CM | POA: Diagnosis not present

## 2020-03-04 DIAGNOSIS — C50919 Malignant neoplasm of unspecified site of unspecified female breast: Secondary | ICD-10-CM

## 2020-03-04 DIAGNOSIS — Z923 Personal history of irradiation: Secondary | ICD-10-CM | POA: Insufficient documentation

## 2020-03-04 DIAGNOSIS — Z803 Family history of malignant neoplasm of breast: Secondary | ICD-10-CM | POA: Insufficient documentation

## 2020-03-04 DIAGNOSIS — Z87891 Personal history of nicotine dependence: Secondary | ICD-10-CM | POA: Diagnosis not present

## 2020-03-04 DIAGNOSIS — Z801 Family history of malignant neoplasm of trachea, bronchus and lung: Secondary | ICD-10-CM | POA: Insufficient documentation

## 2020-03-04 DIAGNOSIS — C50912 Malignant neoplasm of unspecified site of left female breast: Secondary | ICD-10-CM | POA: Insufficient documentation

## 2020-03-04 MED ORDER — ANASTROZOLE 1 MG PO TABS: 1.0000 mg | ORAL_TABLET | Freq: Every day | ORAL | 3 refills | Status: AC

## 2020-03-04 NOTE — Progress Notes (Signed)
Survivorship Care Plan visit completed.  Treatment summary reviewed and given to patient.  ASCO answers booklet reviewed and given to patient.  CARE program and Cancer Transitions discussed with patient along with other resources cancer center offers to patients and caregivers.  Patient verbalized understanding.    Patient in agreement for APP to have a Virtual visit to introduce them to the Survivorship Clinic.  Encouraged patient to call for any questions or concerns. 

## 2020-03-04 NOTE — Progress Notes (Signed)
Subjective:   Elizabeth Morgan is a 70 y.o. female who presents for Medicare Annual (Subsequent) preventive examination.  Review of Systems    N/A  Cardiac Risk Factors include: advanced age (>58men, >86 women)     Objective:    Today's Vitals   03/05/20 0959  BP: 138/76  Pulse: 63  Temp: 98.4 F (36.9 C)  TempSrc: Oral  SpO2: 97%  Weight: 164 lb 3.2 oz (74.5 kg)  Height: 5\' 3"  (1.6 m)  PainSc: 0-No pain   Body mass index is 29.09 kg/m.  Advanced Directives 03/05/2020 03/04/2020 01/04/2020 11/12/2019 07/13/2019 06/26/2019 06/12/2019  Does Patient Have a Medical Advance Directive? No No No Yes No No No  Type of Advance Directive - - - [place]  Does patient want to make changes to medical advance directive? - - - - - - -  Would patient like information on creating a medical advance directive? No - Patient declined - - - No - Patient declined No - Patient declined No - Patient declined    Current Medications (verified) Outpatient Encounter Medications as of 03/05/2020  Medication Sig  . [START ON 03/25/2020] anastrozole (ARIMIDEX) 1 MG tablet Take 1 tablet (1 mg total) by mouth daily.  . Calcium Carbonate-Vitamin D 600-400 MG-UNIT tablet Take 2 tablets by mouth daily.  . cetirizine (ZYRTEC) 10 MG tablet Take 10 mg by mouth daily as needed (allergies).   . fluticasone (FLONASE) 50 MCG/ACT nasal spray Place 1 spray into both nostrils daily as needed for allergies or rhinitis.  . [DISCONTINUED] anastrozole (ARIMIDEX) 1 MG tablet TAKE 1 TABLET BY MOUTH EVERY DAY   No facility-administered encounter medications on file as of 03/05/2020.    Allergies (verified) Aleve [naproxen sodium], Aspirin, Chicken protein, Codeine, and Naproxen   History: Past Medical History:  Diagnosis Date  . Allergy   . Breast cancer ([place])   . Cancer [facility])    left breast  . Family history of breast cancer   . Family history of lung cancer   . Hearing loss   . Hernia,  abdominal    showed up end of April 2018  . History of herpes zoster 11/05/2015  . Seasonal allergies    Past Surgical History:  Procedure Laterality Date  . ARTHRODESIS METATARSALPHALANGEAL JOINT (MTPJ) Left 06/03/2016   Procedure: ARTHRODESIS METATARSALPHALANGEAL JOINT (MTPJ);  Surgeon: Albertine Patricia, DPM;  Location: [place];  Service: Podiatry;  Laterality: Left;  . BREAST LUMPECTOMY WITH SENTINEL LYMPH NODE BIOPSY Left 06/29/2019   Procedure: BREAST LUMPECTOMY WITH SENTINEL LYMPH NODE BX;  Surgeon: Robert Bellow, MD;  Location: [facility];  Service: General;  Laterality: Left;  . CESAREAN SECTION    . COLONOSCOPY    . COLONOSCOPY WITH PROPOFOL N/A 01/04/2020   Procedure: COLONOSCOPY WITH PROPOFOL;  Surgeon: Robert Bellow, MD;  Location: [facility];  Service: Endoscopy;  Laterality: N/A;  . EYE SURGERY Bilateral    X 2  . HAMMER TOE SURGERY Left 06/03/2016   Procedure: HAMMER TOE CORRECTION - 2nd left toe;  Surgeon: Albertine Patricia, DPM;  Location: [place];  Service: Podiatry;  Laterality: Left;  . TUBAL LIGATION    . WEIL OSTEOTOMY Left 06/03/2016   Procedure: WEIL OSTEOTOMY  SECOND TOE;  Surgeon: Albertine Patricia, DPM;  Location: [place];  Service: Podiatry;  Laterality: Left;   Family History  Problem Relation Age of Onset  . Dementia Mother   . Dementia Father   .  Hypertension Father   . Heart disease Father   . Lung cancer Father   . Breast cancer Sister 41  . Lung cancer Maternal Aunt   . Lung cancer Maternal Uncle    Social History   Socioeconomic History  . Marital status: Married    Spouse name: Not on file  . Number of children: 1  . Years of education: Not on file  . Highest education level: Bachelor's degree (e.g., BA, AB, BS)  Occupational History  . Occupation: retired  Tobacco Use  . Smoking status: Former Smoker    Years: 10.00    Quit date: 1988    Years since quitting: 34.1  . Smokeless tobacco:  Never Used  . Tobacco comment: Quit about 30 years ago  Vaping Use  . Vaping Use: Never used  Substance and Sexual Activity  . Alcohol use: Yes    Comment: occasionally, 1x/month  . Drug use: No  . Sexual activity: Never  Other Topics Concern  . Not on file  Social History Narrative  . Not on file   Social Determinants of Health   Financial Resource Strain: Low Risk   . Difficulty of Paying Living Expenses: Not hard at all  Food Insecurity: No Food Insecurity  . Worried About Charity fundraiser in the Last Year: Never true  . Ran Out of Food in the Last Year: Never true  Transportation Needs: No Transportation Needs  . Lack of Transportation (Medical): No  . Lack of Transportation (Non-Medical): No  Physical Activity: Inactive  . Days of Exercise per Week: 0 days  . Minutes of Exercise per Session: 0 min  Stress: No Stress Concern Present  . Feeling of Stress : Only a little  Social Connections: Moderately Isolated  . Frequency of Communication with Friends and Family: More than three times a week  . Frequency of Social Gatherings with Friends and Family: Three times a week  . Attends Religious Services: Never  . Active Member of Clubs or Organizations: No  . Attends Archivist Meetings: Never  . Marital Status: Married    Tobacco Counseling Counseling given: Not Answered Comment: Quit about 30 years ago   Clinical Intake:  Pre-visit preparation completed: Yes  Pain : No/denies pain Pain Score: 0-No pain     Nutritional Status: BMI 25 -29 Overweight Nutritional Risks: None Diabetes: No  How often do you need to have someone help you when you read instructions, pamphlets, or other written materials from your doctor or pharmacy?: 1 - Never  Diabetic? No  Interpreter Needed?: No  Information entered by :: [place], LPN   Activities of Daily Living In your present state of health, do you have any difficulty performing the following activities:  03/05/2020 06/26/2019  Hearing? Y N  Comment Does not wear hearing aids at this time. -  Vision? N N  Difficulty concentrating or making decisions? N N  Walking or climbing stairs? N N  Dressing or bathing? N N  Doing errands, shopping? N N  Preparing Food and eating ? N -  Using the Toilet? N -  In the past six months, have you accidently leaked urine? N -  Do you have problems with loss of bowel control? N -  Managing your Medications? N -  Managing your Finances? N -  Housekeeping or managing your Housekeeping? N -  Some recent data might be hidden    Patient Care Team: Jerrol Banana., MD as PCP -  General (Family Medicine) Bary Castilla, Forest Gleason, MD as Consulting Physician (General Surgery) Sindy Guadeloupe, MD as Consulting Physician (Oncology) Noreene Filbert, MD as Referring Physician (Radiation Oncology) Theodore Demark, RN as Oncology Nurse Navigator  Indicate any recent Medical Services you may have received from other than [facility] providers in the past year (date may be approximate).     Assessment:   This is a routine wellness examination for Dailey.  Hearing/Vision screen No exam data present  Dietary issues and exercise activities discussed: Current Exercise Habits: The patient does not participate in regular exercise at present, Exercise limited by: None identified  Goals    . DIET - INCREASE WATER INTAKE     Recommend to drink at least 6-8 8oz glasses of water per day.    . Prevent falls     Recommend to continue efforts to reduce smoking habits until no longer smoking.      Depression Screen PHQ 2/9 Scores 03/05/2020 02/28/2019 02/07/2018 02/03/2017 02/03/2016  PHQ - 2 Score 0 0 0 0 0  PHQ- 9 Score - - 0 0 -    Fall Risk Fall Risk  03/05/2020 02/28/2019 02/07/2018 02/03/2016  Falls in the past year? 1 0 0 Yes  Number falls in past yr: 0 0 0 1  Injury with Fall? 0 0 0 Yes  Comment - - - slight cut on chin  Risk for fall due to : No Fall Risks - - -  Follow  up Falls prevention discussed - - -    FALL RISK PREVENTION PERTAINING TO THE HOME:  Any stairs in or around the home? Yes If so, are there any without handrails? No  Home free of loose throw rugs in walkways, pet beds, electrical cords, etc? Yes  Adequate lighting in your home to reduce risk of falls? Yes   ASSISTIVE DEVICES UTILIZED TO PREVENT FALLS:  Life alert? No  Use of a cane, walker or w/c? No  Grab bars in the bathroom? No  Shower chair or bench in shower? No  Elevated toilet seat or a handicapped toilet? No   TIMED UP AND GO:  Was the test performed? Yes .  Length of time to ambulate 10 feet: 9 sec.   Gait steady and fast without use of assistive device  Cognitive Function: Normal cognitive status assessed by direct observation by this Nurse Health Advisor. No abnormalities found.          Immunizations Immunization History  Administered Date(s) Administered  . PFIZER(Purple Top)SARS-COV-2 Vaccination 03/13/2019, 04/03/2019, 11/13/2019  . Td 05/27/2003, 04/22/2019  . Tdap 02/03/2017    TDAP status: Up to date  Flu Vaccine status: Declined, Education has been provided regarding the importance of this vaccine but patient still declined. Advised may receive this vaccine at local pharmacy or Health Dept. Aware to provide a copy of the vaccination record if obtained from local pharmacy or Health Dept. Verbalized acceptance and understanding.  Pneumococcal vaccine status: Declined,  Education has been provided regarding the importance of this vaccine but patient still declined. Advised may receive this vaccine at local pharmacy or Health Dept. Aware to provide a copy of the vaccination record if obtained from local pharmacy or Health Dept. Verbalized acceptance and understanding.   Covid-19 vaccine status: Completed vaccines  Qualifies for Shingles Vaccine? Yes   Zostavax completed No   Shingrix Completed?: No.    Education has been provided regarding the  importance of this vaccine. Patient has been advised to call insurance  company to determine out of pocket expense if they have not yet received this vaccine. Advised may also receive vaccine at local pharmacy or Health Dept. Verbalized acceptance and understanding.  Screening Tests Health Maintenance  Topic Date Due  . URINE MICROALBUMIN  Never done  . INFLUENZA VACCINE  04/17/2020 (Originally 08/19/2019)  . PNA vac Low Risk Adult (1 of 2 - PCV13) 03/05/2021 (Originally 09/24/2017)  . COVID-19 Vaccine (4 - Booster for Pfizer series) 05/13/2020  . MAMMOGRAM  05/20/2021  . COLONOSCOPY (Pts 45-32yrs Insurance coverage will need to be confirmed)  01/03/2025  . TETANUS/TDAP  04/21/2029  . DEXA SCAN  Completed  . Hepatitis C Screening  Completed    Health Maintenance  Health Maintenance Due  Topic Date Due  . URINE MICROALBUMIN  Never done    Colorectal cancer screening: Type of screening: Colonoscopy. Completed 01/04/20. Repeat every 5 years  Mammogram status: Completed 06/05/19. Repeat every year  Bone Density status: Completed 05/21/19. Results reflect: Previous DEXA scan was normal. No repeat needed unless advised by a physician.  Lung Cancer Screening: (Low Dose CT Chest recommended if Age 68-80 years, 30 pack-year currently smoking OR have quit w/in 15years.) does not qualify.   Additional Screening:  Hepatitis C Screening: Up to date  Vision Screening: Recommended annual ophthalmology exams for early detection of glaucoma and other disorders of the eye. Is the patient up to date with their annual eye exam? No Who is the provider or what is the name of the office in which the patient attends annual eye exams? n/a If pt is not established with a provider, would they like to be referred to a provider to establish care? No .   Dental Screening: Recommended annual dental exams for proper oral hygiene  Community Resource Referral / Chronic Care Management: CRR required this visit?  No    CCM required this visit?  No      Plan:     I have personally reviewed and noted the following in the patient's chart:   . Medical and social history . Use of alcohol, tobacco or illicit drugs  . Current medications and supplements . Functional ability and status . Nutritional status . Physical activity . Advanced directives . List of other physicians . Hospitalizations, surgeries, and ER visits in previous 12 months . Vitals . Screenings to include cognitive, depression, and falls . Referrals and appointments  In addition, I have reviewed and discussed with patient certain preventive protocols, quality metrics, and best practice recommendations. A written personalized care plan for preventive services as well as general preventive health recommendations were provided to patient.     [place], [place]   [id number]   Nurse Notes: Pt declined receiving a flu or Prevnar 13 vaccine.

## 2020-03-05 ENCOUNTER — Encounter: Payer: Self-pay | Admitting: *Deleted

## 2020-03-05 ENCOUNTER — Telehealth: Payer: Self-pay | Admitting: *Deleted

## 2020-03-05 ENCOUNTER — Ambulatory Visit (INDEPENDENT_AMBULATORY_CARE_PROVIDER_SITE_OTHER): Payer: Medicare Other

## 2020-03-05 ENCOUNTER — Other Ambulatory Visit: Payer: Self-pay

## 2020-03-05 ENCOUNTER — Ambulatory Visit: Payer: Medicare Other

## 2020-03-05 VITALS — BP 138/76 | HR 63 | Temp 98.4°F | Ht 63.0 in | Wt 164.2 lb

## 2020-03-05 DIAGNOSIS — Z0000 Encounter for general adult medical examination without abnormal findings: Secondary | ICD-10-CM | POA: Diagnosis not present

## 2020-03-05 NOTE — Patient Instructions (Signed)
Elizabeth Morgan , Thank you for taking time to come for your Medicare Wellness Visit. I appreciate your ongoing commitment to your health goals. Please review the following plan we discussed and let me know if I can assist you in the future.   Screening recommendations/referrals: Colonoscopy: Up to date, due 12/2024 Mammogram: Up to date, due 05/2020 Bone Density: Previous DEXA scan was normal. No repeat needed unless advised by a physician. Recommended yearly ophthalmology/optometry visit for glaucoma screening and checkup Recommended yearly dental visit for hygiene and checkup  Vaccinations: Influenza vaccine: Currently due, declined receiving. Pneumococcal vaccine: Currently due, declined receiving.  Tdap vaccine: Up to date, due 04/2029 Shingles vaccine: Shingrix discussed. Please contact your pharmacy for coverage information.     Advanced directives: Advance directive discussed with you today. Even though you declined this today please call our office should you change your mind and we can give you the proper paperwork for you to fill out.  Conditions/risks identified: Fall risk preventatives discussed today. Recommend to drink at least 6-8 8oz glasses of water per day.  Next appointment: 03/31/20 @ 9:00 AM with Dr Rosanna Randy    Preventive Care 68 Years and Older, Female Preventive care refers to lifestyle choices and visits with your health care provider that can promote health and wellness. What does preventive care include?  A yearly physical exam. This is also called an annual well check.  Dental exams once or twice a year.  Routine eye exams. Ask your health care provider how often you should have your eyes checked.  Personal lifestyle choices, including:  Daily care of your teeth and gums.  Regular physical activity.  Eating a healthy diet.  Avoiding tobacco and drug use.  Limiting alcohol use.  Practicing safe sex.  Taking low-dose aspirin every day.  Taking  vitamin and mineral supplements as recommended by your health care provider. What happens during an annual well check? The services and screenings done by your health care provider during your annual well check will depend on your age, overall health, lifestyle risk factors, and family history of disease. Counseling  Your health care provider may ask you questions about your:  Alcohol use.  Tobacco use.  Drug use.  Emotional well-being.  Home and relationship well-being.  Sexual activity.  Eating habits.  History of falls.  Memory and ability to understand (cognition).  Work and work Statistician.  Reproductive health. Screening  You may have the following tests or measurements:  Height, weight, and BMI.  Blood pressure.  Lipid and cholesterol levels. These may be checked every 5 years, or more frequently if you are over 51 years old.  Skin check.  Lung cancer screening. You may have this screening every year starting at age 36 if you have a 30-pack-year history of smoking and currently smoke or have quit within the past 15 years.  Fecal occult blood test (FOBT) of the stool. You may have this test every year starting at age 68.  Flexible sigmoidoscopy or colonoscopy. You may have a sigmoidoscopy every 5 years or a colonoscopy every 10 years starting at age 25.  Hepatitis C blood test.  Hepatitis B blood test.  Sexually transmitted disease (STD) testing.  Diabetes screening. This is done by checking your blood sugar (glucose) after you have not eaten for a while (fasting). You may have this done every 1-3 years.  Bone density scan. This is done to screen for osteoporosis. You may have this done starting at age 68.  Mammogram.  This may be done every 1-2 years. Talk to your health care provider about how often you should have regular mammograms. Talk with your health care provider about your test results, treatment options, and if necessary, the need for more  tests. Vaccines  Your health care provider may recommend certain vaccines, such as:  Influenza vaccine. This is recommended every year.  Tetanus, diphtheria, and acellular pertussis (Tdap, Td) vaccine. You may need a Td booster every 10 years.  Zoster vaccine. You may need this after age 9.  Pneumococcal 13-valent conjugate (PCV13) vaccine. One dose is recommended after age 68.  Pneumococcal polysaccharide (PPSV23) vaccine. One dose is recommended after age 60. Talk to your health care provider about which screenings and vaccines you need and how often you need them. This information is not intended to replace advice given to you by your health care provider. Make sure you discuss any questions you have with your health care provider. Document Released: 01/31/2015 Document Revised: 09/24/2015 Document Reviewed: 11/05/2014 Elsevier Interactive Patient Education  2017 [place] Prevention in the Home Falls can cause injuries. They can happen to people of all ages. There are many things you can do to make your home safe and to help prevent falls. What can I do on the outside of my home?  Regularly fix the edges of walkways and driveways and fix any cracks.  Remove anything that might make you trip as you walk through a door, such as a raised step or threshold.  Trim any bushes or trees on the path to your home.  Use bright outdoor lighting.  Clear any walking paths of anything that might make someone trip, such as rocks or tools.  Regularly check to see if handrails are loose or broken. Make sure that both sides of any steps have handrails.  Any raised decks and porches should have guardrails on the edges.  Have any leaves, snow, or ice cleared regularly.  Use sand or salt on walking paths during winter.  Clean up any spills in your garage right away. This includes oil or grease spills. What can I do in the bathroom?  Use night lights.  Install grab bars by the  toilet and in the tub and shower. Do not use towel bars as grab bars.  Use non-skid mats or decals in the tub or shower.  If you need to sit down in the shower, use a plastic, non-slip stool.  Keep the floor dry. Clean up any water that spills on the floor as soon as it happens.  Remove soap buildup in the tub or shower regularly.  Attach bath mats securely with double-sided non-slip rug tape.  Do not have throw rugs and other things on the floor that can make you trip. What can I do in the bedroom?  Use night lights.  Make sure that you have a light by your bed that is easy to reach.  Do not use any sheets or blankets that are too big for your bed. They should not hang down onto the floor.  Have a firm chair that has side arms. You can use this for support while you get dressed.  Do not have throw rugs and other things on the floor that can make you trip. What can I do in the kitchen?  Clean up any spills right away.  Avoid walking on wet floors.  Keep items that you use a lot in easy-to-reach places.  If you need to reach something  above you, use a strong step stool that has a grab bar.  Keep electrical cords out of the way.  Do not use floor polish or wax that makes floors slippery. If you must use wax, use non-skid floor wax.  Do not have throw rugs and other things on the floor that can make you trip. What can I do with my stairs?  Do not leave any items on the stairs.  Make sure that there are handrails on both sides of the stairs and use them. Fix handrails that are broken or loose. Make sure that handrails are as long as the stairways.  Check any carpeting to make sure that it is firmly attached to the stairs. Fix any carpet that is loose or worn.  Avoid having throw rugs at the top or bottom of the stairs. If you do have throw rugs, attach them to the floor with carpet tape.  Make sure that you have a light switch at the top of the stairs and the bottom of  the stairs. If you do not have them, ask someone to add them for you. What else can I do to help prevent falls?  Wear shoes that:  Do not have high heels.  Have rubber bottoms.  Are comfortable and fit you well.  Are closed at the toe. Do not wear sandals.  If you use a stepladder:  Make sure that it is fully opened. Do not climb a closed stepladder.  Make sure that both sides of the stepladder are locked into place.  Ask someone to hold it for you, if possible.  Clearly mark and make sure that you can see:  Any grab bars or handrails.  First and last steps.  Where the edge of each step is.  Use tools that help you move around (mobility aids) if they are needed. These include:  Canes.  Walkers.  Scooters.  Crutches.  Turn on the lights when you go into a dark area. Replace any light bulbs as soon as they burn out.  Set up your furniture so you have a clear path. Avoid moving your furniture around.  If any of your floors are uneven, fix them.  If there are any pets around you, be aware of where they are.  Review your medicines with your doctor. Some medicines can make you feel dizzy. This can increase your chance of falling. Ask your doctor what other things that you can do to help prevent falls. This information is not intended to replace advice given to you by your health care provider. Make sure you discuss any questions you have with your health care provider. Document Released: 10/31/2008 Document Revised: 06/12/2015 Document Reviewed: 02/08/2014 Elsevier Interactive Patient Education  2017 [employer].

## 2020-03-05 NOTE — Telephone Encounter (Signed)
I called pt and called Janese Banks and called pharmacy. The pharmacist said that pt picked up her last refill on previous rx on 2/14, she is not sure that maybe the computer put that date in. There is no reason that pt should not be  taking the medication from Dr. Janese Banks. I called pt and let her know that is seems like computer glitch but she will cont. The pt. Knows to take it every day and the pharmacy made the new prescription to say 2/14 and will cont. To be able to use the new rx with refills.

## 2020-03-05 NOTE — Telephone Encounter (Signed)
Patient called stating that she is confused by the AVS regarding her Arimidex. She states it tells her not to start new prescription until 03/25/20 and states she has been on this medicine for a while already. She requests a return call to discuss what she is to do [phone]  anastrozole 1 MG tablet Commonly known as: ARIMIDEX Take 1 tablet (1 mg total) by mouth daily. Start taking on: March 25, 2020

## 2020-03-06 ENCOUNTER — Inpatient Hospital Stay (HOSPITAL_BASED_OUTPATIENT_CLINIC_OR_DEPARTMENT_OTHER): Payer: Medicare Other | Admitting: Nurse Practitioner

## 2020-03-06 DIAGNOSIS — C50919 Malignant neoplasm of unspecified site of unspecified female breast: Secondary | ICD-10-CM

## 2020-03-06 NOTE — Progress Notes (Signed)
Hematology/Oncology Consult note [facility]  Telephone:([phone] [phone]  Patient Care Team: Jerrol Banana., MD as PCP - General (Family Medicine) Bary Castilla Forest Gleason, MD as Consulting Physician (General Surgery) Sindy Guadeloupe, MD as Consulting Physician (Oncology) Noreene Filbert, MD as Referring Physician (Radiation Oncology) Theodore Demark, RN as Oncology Nurse Navigator   Name of the patient: Elizabeth Morgan  [id number]  [birth year]   Date of visit: 03/06/20  Diagnosis- pathological prognostic stage Ia invasive mammary carcinoma of the left breast pT2 pN0 cM0 grade 3 ER/PR positive HER-2/neu negative s/p lumpectomy  Chief complaint/ Reason for visit-routine follow-up of breast cancer on Arimidex  Heme/Onc history: Patient is a 70 year old female who underwent a recent screening mammogram on 05/21/2019 which showed abnormality in the left breast. This was followed by diagnostic mammogram and ultrasound which showed a 2.4 x 1.4 x 1.9 cm mass in the left breast. No lymphadenopathy seen in the left axilla. Core biopsy showed a 15 mm grade 3 invasive mammary carcinoma with focal mucinous features. ER/PR and HER-2 status was not available at the time of her visit  Patient is currently doing well for her age she does not have any significant comorbidities. She is independent of her ADLs and IADLs.Patient sister had breast cancer at the age of 11.  Final pathology showed invasive mammary carcinoma with negative margins 2.4 x 2.8 x 1.5 cm, grade 3 ER/PR positive HER-2 -1 sentinel lymph node negative for malignancy.  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 At her age this does not translate into absolute chemotherapy benefit. Patient therefore did not require any adjuvant chemotherapy. Patient completed adjuvant radiation therapy and started Arimidex  in September 2021   Interval history-patient is doing well on Arimidex presently along with calcium and vitamin D. Reports occasional pain in the lumpectomy site but denies other new complaints at this time. Appetite and weight have remained stable.  ECOG PS- 1 Pain scale- 0   Review of systems- Review of Systems  Constitutional: Negative for chills, fever, malaise/fatigue and weight loss.  HENT: Negative for congestion, ear discharge and nosebleeds.   Eyes: Negative for blurred vision.  Respiratory: Negative for cough, hemoptysis, sputum production, shortness of breath and wheezing.   Cardiovascular: Negative for chest pain, palpitations, orthopnea and claudication.  Gastrointestinal: Negative for abdominal pain, blood in stool, constipation, diarrhea, heartburn, melena, nausea and vomiting.  Genitourinary: Negative for dysuria, flank pain, frequency, hematuria and urgency.  Musculoskeletal: Negative for back pain, joint pain and myalgias.  Skin: Negative for rash.  Neurological: Negative for dizziness, tingling, focal weakness, seizures, weakness and headaches.  Endo/Heme/Allergies: Does not bruise/bleed easily.  Psychiatric/Behavioral: Negative for depression and suicidal ideas. The patient does not have insomnia.       Allergies  Allergen Reactions  . Aleve [Naproxen Sodium] Other (See Comments)    BURN STOMACH  . Aspirin Other (See Comments)    BURN STOMACH  . Chicken Protein     Dizziness  . Codeine Nausea And Vomiting  . Naproxen Other (See Comments)    Stomach burns      Past Medical History:  Diagnosis Date  . Allergy   . Breast cancer ([place])   . Cancer [facility])    left breast  . Family history of breast cancer   . Family history of lung cancer   . Hearing loss   .  Hernia, abdominal    showed up end of April 2018  . History of herpes zoster 11/05/2015  . Seasonal allergies      Past Surgical History:  Procedure Laterality Date  . ARTHRODESIS  METATARSALPHALANGEAL JOINT (MTPJ) Left 06/03/2016   Procedure: ARTHRODESIS METATARSALPHALANGEAL JOINT (MTPJ);  Surgeon: Albertine Patricia, DPM;  Location: [place];  Service: Podiatry;  Laterality: Left;  . BREAST LUMPECTOMY WITH SENTINEL LYMPH NODE BIOPSY Left 06/29/2019   Procedure: BREAST LUMPECTOMY WITH SENTINEL LYMPH NODE BX;  Surgeon: Robert Bellow, MD;  Location: [facility];  Service: General;  Laterality: Left;  . CESAREAN SECTION    . COLONOSCOPY    . COLONOSCOPY WITH PROPOFOL N/A 01/04/2020   Procedure: COLONOSCOPY WITH PROPOFOL;  Surgeon: Robert Bellow, MD;  Location: [facility];  Service: Endoscopy;  Laterality: N/A;  . EYE SURGERY Bilateral    X 2  . HAMMER TOE SURGERY Left 06/03/2016   Procedure: HAMMER TOE CORRECTION - 2nd left toe;  Surgeon: Albertine Patricia, DPM;  Location: [place];  Service: Podiatry;  Laterality: Left;  . TUBAL LIGATION    . WEIL OSTEOTOMY Left 06/03/2016   Procedure: WEIL OSTEOTOMY  SECOND TOE;  Surgeon: Albertine Patricia, DPM;  Location: [place];  Service: Podiatry;  Laterality: Left;    Social History   Socioeconomic History  . Marital status: Married    Spouse name: Not on file  . Number of children: 1  . Years of education: Not on file  . Highest education level: Bachelor's degree (e.g., BA, AB, BS)  Occupational History  . Occupation: retired  Tobacco Use  . Smoking status: Former Smoker    Years: 10.00    Quit date: 1988    Years since quitting: 34.1  . Smokeless tobacco: Never Used  . Tobacco comment: Quit about 30 years ago  Vaping Use  . Vaping Use: Never used  Substance and Sexual Activity  . Alcohol use: Yes    Comment: occasionally, 1x/month  . Drug use: No  . Sexual activity: Never  Other Topics Concern  . Not on file  Social History Narrative  . Not on file   Social Determinants of Health   Financial Resource Strain: Low Risk   . Difficulty of Paying Living Expenses: Not hard  at all  Food Insecurity: No Food Insecurity  . Worried About Charity fundraiser in the Last Year: Never true  . Ran Out of Food in the Last Year: Never true  Transportation Needs: No Transportation Needs  . Lack of Transportation (Medical): No  . Lack of Transportation (Non-Medical): No  Physical Activity: Inactive  . Days of Exercise per Week: 0 days  . Minutes of Exercise per Session: 0 min  Stress: No Stress Concern Present  . Feeling of Stress : Only a little  Social Connections: Moderately Isolated  . Frequency of Communication with Friends and Family: More than three times a week  . Frequency of Social Gatherings with Friends and Family: Three times a week  . Attends Religious Services: Never  . Active Member of Clubs or Organizations: No  . Attends Archivist Meetings: Never  . Marital Status: Married  Human resources officer Violence: Not At Risk  . Fear of Current or Ex-Partner: No  . Emotionally Abused: No  . Physically Abused: No  . Sexually Abused: No    Family History  Problem Relation Age of Onset  . Dementia Mother   . Dementia Father   .  Hypertension Father   . Heart disease Father   . Lung cancer Father   . Breast cancer Sister 42  . Lung cancer Maternal Aunt   . Lung cancer Maternal Uncle      Current Outpatient Medications:  .  Calcium Carbonate-Vitamin D 600-400 MG-UNIT tablet, Take 2 tablets by mouth daily., Disp: , Rfl:  .  cetirizine (ZYRTEC) 10 MG tablet, Take 10 mg by mouth daily as needed (allergies). , Disp: , Rfl:  .  fluticasone (FLONASE) 50 MCG/ACT nasal spray, Place 1 spray into both nostrils daily as needed for allergies or rhinitis., Disp: , Rfl:  .  [START ON 03/25/2020] anastrozole (ARIMIDEX) 1 MG tablet, Take 1 tablet (1 mg total) by mouth daily., Disp: 90 tablet, Rfl: 3  Physical exam:  Vitals:   03/04/20 1426  BP: 122/62  Pulse: 69  Temp: 99.2 F (37.3 C)  TempSrc: Tympanic  SpO2: 100%  Weight: 163 lb 11.2 oz (74.3 kg)    Physical Exam Constitutional:      General: She is not in acute distress. Eyes:     Extraocular Movements: EOM normal.  Cardiovascular:     Rate and Rhythm: Normal rate and regular rhythm.     Heart sounds: Normal heart sounds.  Pulmonary:     Effort: Pulmonary effort is normal.     Breath sounds: Normal breath sounds.  Skin:    General: Skin is warm and dry.  Neurological:     Mental Status: She is alert and oriented to person, place, and time.    Breast exam was performed in seated and lying down position. Patient is status post left lumpectomy with a well-healed surgical scar. There is significant induration and scarring around the lumpectomy site. No evidence of any palpable masses. No evidence of axillary adenopathy. No evidence of any palpable masses or lumps in the right breast. No evidence of right axillary adenopathy   CMP Latest Ref Rng & Units 11/09/2019  Glucose 70 - 99 mg/dL 126(H)  BUN 8 - 23 mg/dL 21  Creatinine 0.44 - 1.00 mg/dL 0.64  Sodium 135 - 145 mmol/L 137  Potassium 3.5 - 5.1 mmol/L 4.1  Chloride 98 - 111 mmol/L 102  CO2 22 - 32 mmol/L 27  Calcium 8.9 - 10.3 mg/dL 8.8(L)  Total Protein 6.5 - 8.1 g/dL 7.0  Total Bilirubin 0.3 - 1.2 mg/dL 0.7  Alkaline Phos 38 - 126 U/L 62  AST 15 - 41 U/L 21  ALT 0 - 44 U/L 17   CBC Latest Ref Rng & Units 09/17/2019  WBC 4.0 - 10.5 K/uL 6.9  Hemoglobin 12.0 - 15.0 g/dL 13.2  Hematocrit 36.0 - 46.0 % 37.2  Platelets 150 - 400 K/uL 220     Assessment and plan- Patient is a 70 y.o. female  with pathological prognostic stage Ia left breast cancer ER/PR positive HER-2/neu negative s/p lumpectomy T2 N0 M0 grade 3 with Oncotype score of 13 and did not require any adjuvant chemotherapy.  Patient has completed adjuvant radiation therapy and currently on Arimidex.  This is a routine follow-up visit  Clinically patient is doing well with no concerning signs and symptoms of recurrence based on today's exam.  Her mammogram  from June 2021 was within normal limits and she will be due for her next mammogram in June 2022 which will be coordinated by Dr. Bary Castilla.  Patient will continue to use Arimidex along with calcium and vitamin D.  Her baseline bone density scan was normal.  I will see her back in 6 months no labs Visit Diagnosis 1. Encounter for follow-up surveillance of breast cancer   2. Visit for monitoring Arimidex therapy      Dr. Randa Evens, MD, MPH [facility] [phone] 03/06/2020 8:43 AM

## 2020-03-06 NOTE — Progress Notes (Signed)
Survivorship Clinic Consult Note [facility]  Telephone:([phone] [phone] Virtual Visit Progress Note  I connected with [name] on 03/06/20 at  9:00 AM EST by video enabled telemedicine visit and verified that I am speaking with the correct person using two identifiers.   I discussed the limitations, risks, security and privacy concerns of performing an evaluation and management service by telemedicine and the availability of in-person appointments. I also discussed with the patient that there may be a patient responsible charge related to this service. The patient expressed understanding and agreed to proceed.   Other persons participating in the visit and their role in the encounter: none  Patient's location: home Provider's location: home  CLINIC: Survivorship  REASON FOR VISIT: Long-term survivorship surveillance visit for history of breast cancer  BRIEF ONCOLOGIC HISTORY:  Oncology History  Invasive carcinoma of breast ([place])  06/15/2019 Initial Diagnosis   Invasive carcinoma of breast ([place])   07/15/2019 Cancer Staging   Staging form: Breast, AJCC 8th Edition - Pathologic stage from 07/15/2019: Stage IB (pT2, pN0, cM0, G3, ER+, PR+, HER2-) - Signed by Sindy Guadeloupe, MD on 07/15/2019     INTERVAL HISTORY: Patient presents to the survivorship clinic today for initial meeting to review her survivorship care plan detailing her treatment course for breast cancer, as well as monitoring long-term side effects of that treatment, education regarding health maintenance, screening, and overall wellness and health promotion.  Overall, she reports feeling well since completing treatment.  She offers no specific complaints today.  REVIEW OF SYSTEMS:  Review of Systems  Constitutional: Negative for chills, fever, malaise/fatigue and weight loss.  HENT: Negative for hearing loss, nosebleeds, sore throat and tinnitus.   Eyes: Negative for  blurred vision and double vision.  Respiratory: Negative for cough, hemoptysis, shortness of breath and wheezing.   Cardiovascular: Negative for chest pain, palpitations and leg swelling.  Gastrointestinal: Negative for abdominal pain, blood in stool, constipation, diarrhea, melena, nausea and vomiting.  Genitourinary: Negative for dysuria and urgency.  Musculoskeletal: Negative for back pain, falls, joint pain and myalgias.  Skin: Negative for itching and rash.  Neurological: Negative for dizziness, tingling, sensory change, loss of consciousness, weakness and headaches.  Endo/Heme/Allergies: Negative for environmental allergies. Does not bruise/bleed easily.  Psychiatric/Behavioral: Negative for depression. The patient is not nervous/anxious and does not have insomnia.   Breast: No new lumps, bumps, skin changes, discomfort, or discharge. No nipple changes.    PAST MEDICAL/SURGICAL HISTORY:  Past Medical History:  Diagnosis Date  . Allergy   . Breast cancer ([place])   . Cancer [facility])    left breast  . Family history of breast cancer   . Family history of lung cancer   . Hearing loss   . Hernia, abdominal    showed up end of April 2018  . History of herpes zoster 11/05/2015  . Seasonal allergies    Past Surgical History:  Procedure Laterality Date  . ARTHRODESIS METATARSALPHALANGEAL JOINT (MTPJ) Left 06/03/2016   Procedure: ARTHRODESIS METATARSALPHALANGEAL JOINT (MTPJ);  Surgeon: Albertine Patricia, DPM;  Location: [place];  Service: Podiatry;  Laterality: Left;  . BREAST LUMPECTOMY WITH SENTINEL LYMPH NODE BIOPSY Left 06/29/2019   Procedure: BREAST LUMPECTOMY WITH SENTINEL LYMPH NODE BX;  Surgeon: Robert Bellow, MD;  Location: [facility];  Service: General;  Laterality: Left;  . CESAREAN SECTION    . COLONOSCOPY    . COLONOSCOPY WITH PROPOFOL N/A 01/04/2020   Procedure: COLONOSCOPY WITH  PROPOFOL;  Surgeon: Robert Bellow, MD;  Location: [facility] ENDOSCOPY;  Service:  Endoscopy;  Laterality: N/A;  . EYE SURGERY Bilateral    X 2  . HAMMER TOE SURGERY Left 06/03/2016   Procedure: HAMMER TOE CORRECTION - 2nd left toe;  Surgeon: Albertine Patricia, DPM;  Location: [place];  Service: Podiatry;  Laterality: Left;  . TUBAL LIGATION    . WEIL OSTEOTOMY Left 06/03/2016   Procedure: WEIL OSTEOTOMY  SECOND TOE;  Surgeon: Albertine Patricia, DPM;  Location: [place];  Service: Podiatry;  Laterality: Left;    SOCIAL HISTORY:  Social History   Socioeconomic History  . Marital status: Married    Spouse name: Not on file  . Number of children: 1  . Years of education: Not on file  . Highest education level: Bachelor's degree (e.g., BA, AB, BS)  Occupational History  . Occupation: retired  Tobacco Use  . Smoking status: Former Smoker    Years: 10.00    Quit date: 1988    Years since quitting: 34.1  . Smokeless tobacco: Never Used  . Tobacco comment: Quit about 30 years ago  Vaping Use  . Vaping Use: Never used  Substance and Sexual Activity  . Alcohol use: Yes    Comment: occasionally, 1x/month  . Drug use: No  . Sexual activity: Never  Other Topics Concern  . Not on file  Social History Narrative  . Not on file   Social Determinants of Health   Financial Resource Strain: Low Risk   . Difficulty of Paying Living Expenses: Not hard at all  Food Insecurity: No Food Insecurity  . Worried About Charity fundraiser in the Last Year: Never true  . Ran Out of Food in the Last Year: Never true  Transportation Needs: No Transportation Needs  . Lack of Transportation (Medical): No  . Lack of Transportation (Non-Medical): No  Physical Activity: Inactive  . Days of Exercise per Week: 0 days  . Minutes of Exercise per Session: 0 min  Stress: No Stress Concern Present  . Feeling of Stress : Only a little  Social Connections: Moderately Isolated  . Frequency of Communication with Friends and Family: More than three times a week  .  Frequency of Social Gatherings with Friends and Family: Three times a week  . Attends Religious Services: Never  . Active Member of Clubs or Organizations: No  . Attends Archivist Meetings: Never  . Marital Status: Married  Human resources officer Violence: Not At Risk  . Fear of Current or Ex-Partner: No  . Emotionally Abused: No  . Physically Abused: No  . Sexually Abused: No    ALLERGIES:  Allergies  Allergen Reactions  . Aleve [Naproxen Sodium] Other (See Comments)    BURN STOMACH  . Aspirin Other (See Comments)    BURN STOMACH  . Chicken Protein     Dizziness  . Codeine Nausea And Vomiting  . Naproxen Other (See Comments)    Stomach burns     CURRENT MEDICATIONS:  Outpatient Encounter Medications as of 03/06/2020  Medication Sig  . [START ON 03/25/2020] anastrozole (ARIMIDEX) 1 MG tablet Take 1 tablet (1 mg total) by mouth daily.  . Calcium Carbonate-Vitamin D 600-400 MG-UNIT tablet Take 2 tablets by mouth daily.  . cetirizine (ZYRTEC) 10 MG tablet Take 10 mg by mouth daily as needed (allergies).   . fluticasone (FLONASE) 50 MCG/ACT nasal spray Place 1 spray into both nostrils daily as needed for allergies  or rhinitis.   No facility-administered encounter medications on file as of 03/06/2020.    ONCOLOGIC FAMILY HISTORY:  Family History  Problem Relation Age of Onset  . Dementia Mother   . Dementia Father   . Hypertension Father   . Heart disease Father   . Lung cancer Father   . Breast cancer Sister 60  . Lung cancer Maternal Aunt   . Lung cancer Maternal Uncle     GENETIC COUNSELING/TESTING: Negative genetic testing. No pathogenic variants identified on the [employer] Multi-Cancer Panel. VUS in BRIP1 and RAD50 identified. The report date is 07/05/2019.   PHYSICAL EXAMINATION:  Exam limited due to telemedicine  LABORATORY DATA:  No results found for: LABCA2  IMAGING STUDIES:  No imaging on site today  ASSESSMENT & PLAN:  Ms. Sakuma is a pleasant 70  y.o. female with history of stage Ia ER/PR positive, HER-2/neu negative left breast cancer s/p lumpectomy, adjuvant radiation, currently on arimidex since 09/2019 who presents to the Survivorship clinic for our initial meeting and routine follow-up since completing treatment. She received a copy of her survivorship care plan from the survivorship nurse and she denies any acute survivorship concerns and is tolerating arimidex well without side effects.   1. History of breast cancer: Ms. Zuk is continuing to recover from definitive treatment for breast cancer. The SCP details her cancer diagnosis, treatment course, potential late/long-term effects of treatments appropriate follow-up care with recommendations for future, and patient education resources.  A copy of this summary, along with a letter will be sent to the patient's primary care provider via mail/backslash in basket after today's visit. We encouraged her to continue to follow-up with her health care team to continue to monitor help and manage any effects of treatment.  We discussed that there is a chance for the cancer can come back.  The vast majority of recurrences will be detected in the 2-3 years after treatment though late recurrences can occur.  The recommended surveillance for follow-up after diagnosis was reviewed in detail but depending on risk, we generally recommend follow up with breast exam every 3-6 months for the first 2 years then every 6-12 months for 3-5 years then annually thereafter.  We discussed that the goal of surveillance after treatment is to detect disease if it returns as early as possible.  The most useful tools for detecting recurrence include a thorough evaluation of symptoms and a physical exam which includes a complete breast exam and yearly mammograms or other imaging studies. Additional imaging may be considered based on symptoms or examination findings which is why is it is important to report concerns to her health  care team.  Blood work may be performed at these visits for if there are symptoms or exam findings concerning for recurrence.  Patient was advised that if she feels that something is not right she should schedule an appointment to be evaluated.  Concerning symptoms discussed in detail.   Ms. Swayze will return to the survivorship clinic as needed and continue scheduled followups with her healthcare team as outlined in her SCP.   2. Survivorship Care Survey: Survivorship Care Survey completed post-treatment as recommended by the NCCN Survivorship Guidelines. Patient's answers were reviewed as below. Based on these, as well as the type of cancer and treatment she underwent, the following Survivorship Topics were discussed:   - Anxiety, Depression, and Distress- patient denies - Cognitive Function-patient denies - Fatigue-patient denies - Lymphedema- patient denies - Hormone-Related Symptoms-patient denies - Pain-patient denies -  Sexual Function- patient denies - Sleep Disorder-patient denies - Healthy Lifestyle- see below - Immunizations and Infections- see below  3. Tobacco Avoidance: I commended Ms. Artus's continued efforts to remain tobacco-free.  We discussed that one of the most important risk reduction strategies in preventing cancer recurrence in all patients is to avoid tobacco. She is committed to abstaining from tobacco.  4. Cancer screening/Health & Wellness Promotion:  Due to Ms. Adolf's history and her age, she should receive screening for skin cancers, colon cancer, and gynecologic cancers. The information and recommendations are listed on the patient's comprehensive care plan/treatment summary and were reviewed in detail with the patient.  I encouraged her to speak with her PCP about arranging these and performing annual wellness exams, as appropriate.    Colorectal cancer-for the average risk patient, screening for colon cancer is recommended beginning at age 71.  Various  screening strategies exist including colonoscopy, sigmoidoscopy, stool testing for blood, etc.  You can discuss these with your primary care provider to determine which option is best for you.  Cervical Cancer- last pap was reported in 2018 as NILM and HPV negative. Can discuss risk vs benefit of continued pap smears with her pcp.   5. Bone density- patient has baseline bone density scan from May 2021 that was reported as normal. At increased risk of bone loss in setting of aromatase inhibitor use and post menopausal. Continue supplemental calcium and vitamin d along with weight bearing exercise as tolerated. Plan to repeat bone density scan every 2 years.   6. Physical activity/Healthy eating: Getting adequate physical activity and maintaining a healthy diet as a cancer survivor is important for overall wellness and reduces the risk of cancer recurrence.   The "Nutrition Rainbow" handout, as well as the handout "Take Control of Your Health and Reduce Your Cancer Risk" from the [place] were provided to patient.  She was also encouraged to engage in moderate to vigorous exercise for 30 minutes per day most days of the week. We discussed the CARE program which is offered 2 days a week at [facility] without cost to cancer survivors. At this time she declines referral to the CARE program but should she change her mind, a referral can be placed in Epic by entering the order 'Referral to CARE' at [facility].  We also discussed the importance and health benefits of maintaining a healthy weight and eating a balanced diet. Ms. Rossel was encouraged to consume 5-7 servings of fruits and vegetables per day. We discussed that a healthy BMI is 18.5-24.9 and that maintaining a health weight reduces risk of cancer recurrences.   7. Support services/Counseling: Ms. Batley was seen today in in effort to address both the physical and social concerns of our cancer survivors at [facility] at [facility]. It  is not uncommon for this period of the patient's cancer care trajectory to be one of many emotions and stressors.  I provided support today through active listening, validation of concerns, and expressive supportive counseling.  Ms. Streight was encouraged to take advantage of our support services programs and support groups to better cope in her new life as a cancer survivor after completing anti-cancer treatment. We also discussed Counseling Services available to Cancer Survivors with [facility]. She can contact the [place] if she elects to participate.    Disposition: - Return to [facility] for follow-up with Medical Oncology, Dr. Janese Banks on 09/01/20 - Return to [facility] for follow-up with Radiation Oncology, Dr. Baruch Gouty, on 04/11/20 -  Return to survivorship clinic as needed; no additional follow-up needed at this time.  -Consider transitioning the patient to long-term survivorship, when clinically appropriate.   The patient was provided an opportunity to ask questions and all were answered. The patient agreed with the plan and demonstrated an understanding of the instructions.   The patient was advised to call back or seek an in-person evaluation if the symptoms worsen or if the condition fails to improve as anticipated.   I spent 25 minutes face-to-face video visit time dedicated to the care of this patient on the date of this encounter to include pre-visit review of treatment summary and medical oncology note and radiation oncology note, face-to-face time with the patient, and post visit ordering of testing/documentation.   Thank you for allowing me to participate in the care of this very pleasant patient.  Beckey Rutter, DNP, AGNP-C [place] [facility]  Note: PRIMARY CARE PROVIDER Jerrol Banana., MD [phone]

## 2020-03-20 ENCOUNTER — Other Ambulatory Visit: Payer: Self-pay | Admitting: General Surgery

## 2020-03-20 DIAGNOSIS — C50919 Malignant neoplasm of unspecified site of unspecified female breast: Secondary | ICD-10-CM

## 2020-03-28 NOTE — Progress Notes (Signed)
I,April Miller,acting as a scribe for Wilhemena Durie, MD.,have documented all relevant documentation on the behalf of Wilhemena Durie, MD,as directed by  Wilhemena Durie, MD while in the presence of Wilhemena Durie, MD.   Established patient visit   Patient: Elizabeth Morgan   DOB: [birth year]   70 y.o. Female  MRN: [id number] Visit Date: 03/31/2020  Today's healthcare provider: Wilhemena Durie, MD   Chief Complaint  Patient presents with  . Annual Exam   Subjective    HPI  Patient overall is doing well.  She has been diagnosed with breast cancer and is now on Arimidex.  She has appointment with radiation oncology later this month.  She got emotional about talking about the breast cancer she has now but she is doing well.  Her sister died of breast cancer Apr 20, 2011 but patient had a completely negative genetic work-up. Patient had a normal colonoscopy and endoscopy December 2021. Patient is doing well.  She now has a beach house that she goes to half the time.  She is enjoying [facility]. Patient had AWV with [facility] on 03/05/2020.  Hypertension, follow-up  BP Readings from Last 3 Encounters:  03/31/20 134/67  03/05/20 138/76  03/04/20 122/62   Wt Readings from Last 3 Encounters:  03/31/20 166 lb (75.3 kg)  03/05/20 164 lb 3.2 oz (74.5 kg)  03/04/20 163 lb 11.2 oz (74.3 kg)     She was last seen for hypertension 1 years ago.  BP at that visit was 122/76. Management since that visit includes; Blood pressure is good. She reports good compliance with treatment. She is not having side effects. none She is exercising. She is adherent to low salt diet.   Outside blood pressures are normal.  She does not smoke.  Use of agents associated with hypertension: none.   --------------------------------------------------------------------  Lipid/Cholesterol, follow-up  Last Lipid Panel: Lab Results  Component Value Date   CHOL 248 (H) 03/28/2019   LDLCALC 155 (H)  03/28/2019   HDL 72 03/28/2019   TRIG 122 03/28/2019    She was last seen for this 1 years ago.  Management since that visit includes; patient is not currently on medication for cholesterol. She reports good compliance with treatment. She is not having side effects. none She is following a Regular, Low Sodium diet. Current exercise: walking  Last metabolic panel Lab Results  Component Value Date   GLUCOSE 126 (H) 11/09/2019   NA 137 11/09/2019   K 4.1 11/09/2019   BUN 21 11/09/2019   CREATININE 0.64 11/09/2019   GFRNONAA >60 11/09/2019   GFRAA 106 03/28/2019   CALCIUM 8.8 (L) 11/09/2019   AST 21 11/09/2019   ALT 17 11/09/2019   The 10-year ASCVD risk score Mikey Bussing DC Jr., et al., 04/20/2011) is: 14.1%  --------------------------------------------------------------------  Deficiency anemia From 03/28/2019-Follow-up CBC.  Work-up as necessary.  Screening for osteoporosis From 03/28/2019-Mild increase in thoracic kyphosis in this postmenopausal woman.  Plan BMD.      Medications: Outpatient Medications Prior to Visit  Medication Sig  . anastrozole (ARIMIDEX) 1 MG tablet Take 1 tablet (1 mg total) by mouth daily.  . Calcium Carbonate-Vitamin D 600-400 MG-UNIT tablet Take 2 tablets by mouth daily.  . cetirizine (ZYRTEC) 10 MG tablet Take 10 mg by mouth daily as needed (allergies).   . fluticasone (FLONASE) 50 MCG/ACT nasal spray Place 1 spray into both nostrils daily as needed for allergies or rhinitis.   No facility-administered medications  prior to visit.    Review of Systems  All other systems reviewed and are negative.       Objective    BP 134/67 (BP Location: Right Arm, Patient Position: Sitting, Cuff Size: Normal)   Pulse 61   Temp 98.1 F (36.7 C) (Oral)   Resp 16   Ht 5\' 3"  (1.6 m)   Wt 166 lb (75.3 kg)   SpO2 97%   BMI 29.41 kg/m  BP Readings from Last 3 Encounters:  03/31/20 134/67  03/05/20 138/76  03/04/20 122/62   Wt Readings from Last 3  Encounters:  03/31/20 166 lb (75.3 kg)  03/05/20 164 lb 3.2 oz (74.5 kg)  03/04/20 163 lb 11.2 oz (74.3 kg)       Physical Exam Vitals reviewed.  Constitutional:      Appearance: She is well-developed.  HENT:     Head: Normocephalic and atraumatic.     Right Ear: External ear normal.     Left Ear: External ear normal.     Nose: Nose normal.  Eyes:     Conjunctiva/sclera: Conjunctivae normal.     Pupils: Pupils are equal, round, and reactive to light.  Cardiovascular:     Rate and Rhythm: Normal rate and regular rhythm.     Heart sounds: Normal heart sounds.  Pulmonary:     Effort: Pulmonary effort is normal.     Breath sounds: Normal breath sounds.  Chest:  Breasts:     Right: Normal.     Left: Normal.    Abdominal:     General: Bowel sounds are normal.     Palpations: Abdomen is soft.  Musculoskeletal:        General: Normal range of motion.     Cervical back: Normal range of motion and neck supple.     Comments: Mild increase in thoracic kyphosis.  Skin:    General: Skin is warm and dry.  Neurological:     General: No focal deficit present.     Mental Status: She is alert and oriented to person, place, and time.     Deep Tendon Reflexes: Reflexes are normal and symmetric.     Comments: Negative Tinel's sign and negative Phalen  Psychiatric:        Mood and Affect: Mood normal.        Behavior: Behavior normal.        Thought Content: Thought content normal.        Judgment: Judgment normal.      No results found for any visits on 03/31/20.  Assessment & Plan     1. Essential (primary) hypertension Good control with diet and exercise - TSH - CBC w/Diff/Platelet - Comprehensive Metabolic Panel (CMET)  2. Hyperlipidemia, unspecified hyperlipidemia type Follow-up lipids and treat up as appropriate. - Lipid panel - TSH - Comprehensive Metabolic Panel (CMET)  3. Postmenopausal BMD 1 year ago. - TSH  4. Hyperlipidemia associated with type 2 diabetes  mellitus (HCC) Follow-up A1c of hyperglycemia  5. Family history of breast cancer   6. Family history of lung cancer   7. Invasive carcinoma of breast [facility]) Patient diagnosed with breast cancer last year.  Has breast exam and follow-up with surgery and with radiation oncology and oncology.  On Arimidex.   No follow-ups on file.      I, Wilhemena Durie, MD, have reviewed all documentation for this visit. The documentation on 03/31/20 for the exam, diagnosis, procedures, and orders are all accurate and  complete.    Jerianne Anselmo Cranford Mon, MD  [facility] [phone] (phone) [phone] (fax)  [place]

## 2020-03-31 ENCOUNTER — Other Ambulatory Visit: Payer: Self-pay

## 2020-03-31 ENCOUNTER — Ambulatory Visit (INDEPENDENT_AMBULATORY_CARE_PROVIDER_SITE_OTHER): Payer: Medicare Other | Admitting: Family Medicine

## 2020-03-31 ENCOUNTER — Encounter: Payer: Self-pay | Admitting: Family Medicine

## 2020-03-31 VITALS — BP 134/67 | HR 61 | Temp 98.1°F | Resp 16 | Ht 63.0 in | Wt 166.0 lb

## 2020-03-31 DIAGNOSIS — E1169 Type 2 diabetes mellitus with other specified complication: Secondary | ICD-10-CM

## 2020-03-31 DIAGNOSIS — Z780 Asymptomatic menopausal state: Secondary | ICD-10-CM

## 2020-03-31 DIAGNOSIS — C50919 Malignant neoplasm of unspecified site of unspecified female breast: Secondary | ICD-10-CM | POA: Diagnosis not present

## 2020-03-31 DIAGNOSIS — Z801 Family history of malignant neoplasm of trachea, bronchus and lung: Secondary | ICD-10-CM | POA: Diagnosis not present

## 2020-03-31 DIAGNOSIS — I10 Essential (primary) hypertension: Secondary | ICD-10-CM | POA: Diagnosis not present

## 2020-03-31 DIAGNOSIS — E785 Hyperlipidemia, unspecified: Secondary | ICD-10-CM

## 2020-03-31 DIAGNOSIS — Z803 Family history of malignant neoplasm of breast: Secondary | ICD-10-CM

## 2020-04-01 LAB — CBC WITH DIFFERENTIAL/PLATELET
Basophils Absolute: 0 10*3/uL (ref 0.0–0.2)
Basos: 1 %
EOS (ABSOLUTE): 0.1 10*3/uL (ref 0.0–0.4)
Eos: 2 %
Hematocrit: 37.9 % (ref 34.0–46.6)
Hemoglobin: 13.2 g/dL (ref 11.1–15.9)
Immature Grans (Abs): 0 10*3/uL (ref 0.0–0.1)
Immature Granulocytes: 0 %
Lymphocytes Absolute: 1.7 10*3/uL (ref 0.7–3.1)
Lymphs: 26 %
MCH: 30.9 pg (ref 26.6–33.0)
MCHC: 34.8 g/dL (ref 31.5–35.7)
MCV: 89 fL (ref 79–97)
Monocytes Absolute: 0.6 10*3/uL (ref 0.1–0.9)
Monocytes: 9 %
Neutrophils Absolute: 4 10*3/uL (ref 1.4–7.0)
Neutrophils: 62 %
Platelets: 209 10*3/uL (ref 150–450)
RBC: 4.27 x10E6/uL (ref 3.77–5.28)
RDW: 12.4 % (ref 11.7–15.4)
WBC: 6.4 10*3/uL (ref 3.4–10.8)

## 2020-04-01 LAB — TSH: TSH: 1.2 u[IU]/mL (ref 0.450–4.500)

## 2020-04-01 LAB — COMPREHENSIVE METABOLIC PANEL
ALT: 25 IU/L (ref 0–32)
AST: 20 IU/L (ref 0–40)
Albumin/Globulin Ratio: 1.7 (ref 1.2–2.2)
Albumin: 4.4 g/dL (ref 3.8–4.8)
Alkaline Phosphatase: 87 IU/L (ref 44–121)
BUN/Creatinine Ratio: 19 (ref 12–28)
BUN: 12 mg/dL (ref 8–27)
Bilirubin Total: 0.5 mg/dL (ref 0.0–1.2)
CO2: 24 mmol/L (ref 20–29)
Calcium: 9.8 mg/dL (ref 8.7–10.3)
Chloride: 99 mmol/L (ref 96–106)
Creatinine, Ser: 0.62 mg/dL (ref 0.57–1.00)
Globulin, Total: 2.6 g/dL (ref 1.5–4.5)
Glucose: 92 mg/dL (ref 65–99)
Potassium: 4.4 mmol/L (ref 3.5–5.2)
Sodium: 139 mmol/L (ref 134–144)
Total Protein: 7 g/dL (ref 6.0–8.5)
eGFR: 98 mL/min/{1.73_m2} (ref >59)

## 2020-04-01 LAB — LIPID PANEL
Chol/HDL Ratio: 3 ratio (ref 0.0–4.4)
Cholesterol, Total: 229 mg/dL — ABNORMAL HIGH (ref 100–199)
HDL: 77 mg/dL (ref >39)
LDL Chol Calc (NIH): 131 mg/dL — ABNORMAL HIGH (ref 0–99)
Triglycerides: 122 mg/dL (ref 0–149)
VLDL Cholesterol Cal: 21 mg/dL (ref 5–40)

## 2020-04-11 ENCOUNTER — Encounter: Payer: Self-pay | Admitting: Radiation Oncology

## 2020-04-11 ENCOUNTER — Ambulatory Visit
Admission: RE | Admit: 2020-04-11 | Discharge: 2020-04-11 | Disposition: A | Discharge status: Home / Self Care | Payer: Medicare Other | Source: Ambulatory Visit | Attending: Radiation Oncology | Admitting: Radiation Oncology

## 2020-04-11 ENCOUNTER — Ambulatory Visit: Payer: PRIVATE HEALTH INSURANCE | Admitting: Radiation Oncology

## 2020-04-11 VITALS — BP 131/64 | HR 78 | Temp 98.5°F | Wt 163.6 lb

## 2020-04-11 DIAGNOSIS — C50919 Malignant neoplasm of unspecified site of unspecified female breast: Secondary | ICD-10-CM

## 2020-04-11 DIAGNOSIS — Z08 Encounter for follow-up examination after completed treatment for malignant neoplasm: Secondary | ICD-10-CM | POA: Diagnosis not present

## 2020-04-11 DIAGNOSIS — C50912 Malignant neoplasm of unspecified site of left female breast: Secondary | ICD-10-CM | POA: Diagnosis not present

## 2020-04-11 DIAGNOSIS — Z170 Estrogen receptor positive status [ER+]: Secondary | ICD-10-CM | POA: Diagnosis not present

## 2020-04-11 NOTE — Progress Notes (Signed)
Radiation Oncology Follow up Note  Name: Elizabeth Morgan   Date:   04/11/2020 MRN:  [id number] DOB: [birth year]    This 70 y.o. female presents to the clinic today for 105-monthfollow-up status post whole breast radiation to her left breast for stage II T2N0 ER/PR positive invasive mammary carcinoma.  REFERRING PROVIDER: GJerrol Banana,*  HPI: Patient is a 70year old female now out 6 months having completed whole breast radiation to her left breast for stage T2N0 ER/PR positive HER-2 negative invasive mammary carcinoma status post wide local excision.  She is seen today in routine follow-up and is doing well.  She specifically denies breast tenderness cough or bone pain.  She has had some shrinkage of her left breast which is expected..  She has mammogram scheduled for May.  She is currently on Arimidex tolerant well without side effect.  COMPLICATIONS OF TREATMENT: none  FOLLOW UP COMPLIANCE: keeps appointments   PHYSICAL EXAM:  BP 131/64   Pulse 78   Temp 98.5 F (36.9 C) (Tympanic)   Wt 163 lb 9.6 oz (74.2 kg)   BMI 28.98 kg/m  Left breast is somewhat more fibrotic.  Changes are consistent with scarring.  No evidence of mass is noted in either breast.  No axillary or supraclavicular adenopathy is appreciated.  Well-developed well-nourished patient in NAD. HEENT reveals PERLA, EOMI, discs not visualized.  Oral cavity is clear. No oral mucosal lesions are identified. Neck is clear without evidence of cervical or supraclavicular adenopathy. Lungs are clear to A&P. Cardiac examination is essentially unremarkable with regular rate and rhythm without murmur rub or thrill. Abdomen is benign with no organomegaly or masses noted. Motor sensory and DTR levels are equal and symmetric in the upper and lower extremities. Cranial nerves II through XII are grossly intact. Proprioception is intact. No peripheral adenopathy or edema is identified. No motor or sensory levels are noted. Crude visual  fields are within normal range.  RADIOLOGY RESULTS: No current films to review  PLAN: Present time patient is doing well with no evidence of disease.  I am pleased with her overall progress we will review her mammograms when they are available in May.  She continues on Arimidex without side effect.  I will see her back in 6 months for follow-up.  She continues close follow-up care with medical oncology.  Patient knows to call with any concerns.  I would like to take this opportunity to thank you for allowing me to participate in the care of your patient..Noreene Filbert MD

## 2020-05-23 ENCOUNTER — Ambulatory Visit
Admission: RE | Admit: 2020-05-23 | Discharge: 2020-05-23 | Disposition: A | Discharge status: Home / Self Care | Payer: Medicare Other | Source: Ambulatory Visit | Attending: General Surgery | Admitting: General Surgery

## 2020-05-23 ENCOUNTER — Other Ambulatory Visit: Payer: Self-pay

## 2020-05-23 DIAGNOSIS — Z853 Personal history of malignant neoplasm of breast: Secondary | ICD-10-CM | POA: Diagnosis not present

## 2020-05-23 DIAGNOSIS — Z08 Encounter for follow-up examination after completed treatment for malignant neoplasm: Secondary | ICD-10-CM | POA: Diagnosis not present

## 2020-05-23 DIAGNOSIS — Z923 Personal history of irradiation: Secondary | ICD-10-CM | POA: Insufficient documentation

## 2020-05-23 DIAGNOSIS — R922 Inconclusive mammogram: Secondary | ICD-10-CM | POA: Diagnosis not present

## 2020-05-23 DIAGNOSIS — C50919 Malignant neoplasm of unspecified site of unspecified female breast: Secondary | ICD-10-CM

## 2020-05-29 ENCOUNTER — Ambulatory Visit (INDEPENDENT_AMBULATORY_CARE_PROVIDER_SITE_OTHER): Payer: Medicare Other | Admitting: Dermatology

## 2020-05-29 ENCOUNTER — Other Ambulatory Visit: Payer: Self-pay

## 2020-05-29 ENCOUNTER — Encounter: Payer: Self-pay | Admitting: Dermatology

## 2020-05-29 DIAGNOSIS — Z170 Estrogen receptor positive status [ER+]: Secondary | ICD-10-CM | POA: Diagnosis not present

## 2020-05-29 DIAGNOSIS — L578 Other skin changes due to chronic exposure to nonionizing radiation: Secondary | ICD-10-CM | POA: Diagnosis not present

## 2020-05-29 DIAGNOSIS — L570 Actinic keratosis: Secondary | ICD-10-CM

## 2020-05-29 DIAGNOSIS — C50212 Malignant neoplasm of upper-inner quadrant of left female breast: Secondary | ICD-10-CM | POA: Diagnosis not present

## 2020-05-29 NOTE — Progress Notes (Signed)
   Follow-Up Visit   Subjective  Elizabeth Morgan is a 70 y.o. female who presents for the following: Actinic Keratosis (Recheck mid forehead, L temple, ).   The following portions of the chart were reviewed this encounter and updated as appropriate:   Tobacco  Allergies  Meds  Problems  Med Hx  Surg Hx  Fam Hx      Review of Systems:  No other skin or systemic complaints except as noted in HPI or Assessment and Plan.  Objective  Well appearing patient in no apparent distress; mood and affect are within normal limits.  A focused examination was performed including face. Relevant physical exam findings are noted in the Assessment and Plan.  Objective  mid forehead x 1: Residual pink scaly macule    Assessment & Plan  AK (actinic keratosis) mid forehead x 1  Residual AK  Actinic keratoses are precancerous spots that appear secondary to cumulative UV radiation exposure/sun exposure over time. They are chronic with expected duration over 1 year. A portion of actinic keratoses will progress to squamous cell carcinoma of the skin. It is not possible to reliably predict which spots will progress to skin cancer and so treatment is recommended to prevent development of skin cancer.  Recommend daily broad spectrum sunscreen SPF 30+ to sun-exposed areas, reapply every 2 hours as needed.  Recommend staying in the shade or wearing long sleeves, sun glasses (UVA+UVB protection) and wide brim hats (4-inch brim around the entire circumference of the hat). Call for new or changing lesions.  Destruction of lesion - mid forehead x 1 Complexity: simple   Destruction method: cryotherapy   Informed consent: discussed and consent obtained   Timeout:  patient name, date of birth, surgical site, and procedure verified Lesion destroyed using liquid nitrogen: Yes   Region frozen until ice ball extended beyond lesion: Yes   Outcome: patient tolerated procedure well with no complications    Post-procedure details: wound care instructions given    Actinic Damage - chronic, secondary to cumulative UV radiation exposure/sun exposure over time - diffuse scaly erythematous macules with underlying dyspigmentation - Recommend daily broad spectrum sunscreen SPF 30+ to sun-exposed areas, reapply every 2 hours as needed.  - Recommend staying in the shade or wearing long sleeves, sun glasses (UVA+UVB protection) and wide brim hats (4-inch brim around the entire circumference of the hat). - Call for new or changing lesions. - Recommend taking Heliocare sun protection supplement daily in sunny weather for additional sun protection. For maximum protection on the sunniest days, you can take up to 2 capsules of regular Heliocare OR take 1 capsule of Heliocare Ultra. For prolonged exposure (such as a full day in the sun), you can repeat your dose of the supplement 4 hours after your first dose.    Return in about 6 months (around 11/29/2020) for TBSE, Hx of AKs.   I, Othelia Pulling, RMA, am acting as scribe for Forest Gleason, MD .  Documentation: I have reviewed the above documentation for accuracy and completeness, and I agree with the above.  Forest Gleason, MD

## 2020-05-29 NOTE — Patient Instructions (Signed)
If you have any questions or concerns for your doctor, please call our main line at [phone] and press option 4 to reach your doctor's medical assistant. If no one answers, please leave a voicemail as directed and we will return your call as soon as possible. Messages left after 4 pm will be answered the following business day.   You may also send us a message via MyChart. We typically respond to MyChart messages within 1-2 business days.  For prescription refills, please ask your pharmacy to contact our office. Our fax number is [phone].  If you have an urgent issue when the clinic is closed that cannot wait until the next business day, you can page your doctor at the number below.    Please note that while we do our best to be available for urgent issues outside of office hours, we are not available 24/7.   If you have an urgent issue and are unable to reach us, you may choose to seek medical care at your doctor's office, retail clinic, urgent care center, or emergency room.  If you have a medical emergency, please immediately call 911 or go to the emergency department.  Pager Numbers  - Dr. Kowalski: [phone]  - Dr. Crisol Muecke: [phone]  - Dr. Stewart: [phone]  In the event of inclement weather, please call our main line at [phone] for an update on the status of any delays or closures.  Dermatology Medication Tips: Please keep the boxes that topical medications come in in order to help keep track of the instructions about where and how to use these. Pharmacies typically print the medication instructions only on the boxes and not directly on the medication tubes.   If your medication is too expensive, please contact our office at [phone] option 4 or send us a message through MyChart.   We are unable to tell what your co-pay for medications will be in advance as this is different depending on your insurance coverage. However, we may be able to find a substitute  medication at lower cost or fill out paperwork to get insurance to cover a needed medication.   If a prior authorization is required to get your medication covered by your insurance company, please allow us 1-2 business days to complete this process.  Drug prices often vary depending on where the prescription is filled and some pharmacies may offer cheaper prices.  The website www.goodrx.com contains coupons for medications through different pharmacies. The prices here do not account for what the cost may be with help from insurance (it may be cheaper with your insurance), but the website can give you the price if you did not use any insurance.  - You can print the associated coupon and take it with your prescription to the pharmacy.  - You may also stop by our office during regular business hours and pick up a GoodRx coupon card.  - If you need your prescription sent electronically to a different pharmacy, notify our office through [facility] MyChart or by phone at [phone] option 4.  Recommend taking Heliocare sun protection supplement daily in sunny weather for additional sun protection. For maximum protection on the sunniest days, you can take up to 2 capsules of regular Heliocare OR take 1 capsule of Heliocare Ultra. For prolonged exposure (such as a full day in the sun), you can repeat your dose of the supplement 4 hours after your first dose. Heliocare can be purchased at [facility] or at www.heliocare.com.    

## 2020-06-08 ENCOUNTER — Encounter: Payer: Self-pay | Admitting: Dermatology

## 2020-09-01 ENCOUNTER — Encounter: Payer: Self-pay | Admitting: Oncology

## 2020-09-01 ENCOUNTER — Inpatient Hospital Stay: Payer: Medicare Other | Attending: Oncology | Admitting: Oncology

## 2020-09-01 DIAGNOSIS — Z853 Personal history of malignant neoplasm of breast: Secondary | ICD-10-CM | POA: Diagnosis not present

## 2020-09-01 DIAGNOSIS — Z79811 Long term (current) use of aromatase inhibitors: Secondary | ICD-10-CM | POA: Diagnosis not present

## 2020-09-01 DIAGNOSIS — Z5181 Encounter for therapeutic drug level monitoring: Secondary | ICD-10-CM | POA: Diagnosis not present

## 2020-09-01 DIAGNOSIS — Z08 Encounter for follow-up examination after completed treatment for malignant neoplasm: Secondary | ICD-10-CM | POA: Diagnosis not present

## 2020-09-01 MED ORDER — ANASTROZOLE 1 MG PO TABS: 1.0000 mg | ORAL_TABLET | Freq: Every day | ORAL | Status: DC

## 2020-09-04 NOTE — Progress Notes (Signed)
I connected with Elizabeth Morgan on 09/04/20 at  2:15 PM EDT by video enabled telemedicine visit and verified that I am speaking with the correct person using two identifiers.   I discussed the limitations, risks, security and privacy concerns of performing an evaluation and management service by telemedicine and the availability of in-person appointments. I also discussed with the patient that there may be a patient responsible charge related to this service. The patient expressed understanding and agreed to proceed.  Other persons participating in the visit and their role in the encounter:  none  Patient's location:  home Provider's location:  work  Risk analyst Complaint:  routine f/u of breast cancer  Diagnosis- pathological prognostic stage Ia invasive mammary carcinoma of the left breast pT2 pN0 cM0 grade 3 ER/PR positive HER-2/neu negative s/p lumpectomy    History of present illness: Patient is a 70 year old female who underwent a recent screening mammogram on 05/21/2019 which showed abnormality in the left breast.  This was followed by diagnostic mammogram and ultrasound which showed a 2.4 x 1.4 x 1.9 cm mass in the left breast.  No lymphadenopathy seen in the left axilla.  Core biopsy showed a 15 mm grade 3 invasive mammary carcinoma with focal mucinous features.  ER/PR and HER-2 status was not available at the time of her visit   Patient is currently doing well for her age she does not have any significant comorbidities.  She is independent of her ADLs and IADLs.Patient sister had breast cancer at the age of 7.   Final pathology showed invasive mammary carcinoma with negative margins 2.4 x 2.8 x 1.5 cm, grade 3 ER/PR positive HER-2 -1 sentinel lymph node negative for malignancy.   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  At her age this does not translate into absolute chemotherapy benefit.  Patient  therefore did not require any adjuvant chemotherapy. Patient completed adjuvant radiation therapy and started Arimidex in September 2021    Interval history patient is tolerating Arimidex well without any significant side effects along with calcium and vitamin D.  Denies any specific complaint such as hot flashes or joint pains.  Denies any breast concerns at this time   Review of Systems  Constitutional:  Negative for chills, fever, malaise/fatigue and weight loss.  HENT:  Negative for congestion, ear discharge and nosebleeds.   Eyes:  Negative for blurred vision.  Respiratory:  Negative for cough, hemoptysis, sputum production, shortness of breath and wheezing.   Cardiovascular:  Negative for chest pain, palpitations, orthopnea and claudication.  Gastrointestinal:  Negative for abdominal pain, blood in stool, constipation, diarrhea, heartburn, melena, nausea and vomiting.  Genitourinary:  Negative for dysuria, flank pain, frequency, hematuria and urgency.  Musculoskeletal:  Negative for back pain, joint pain and myalgias.  Skin:  Negative for rash.  Neurological:  Negative for dizziness, tingling, focal weakness, seizures, weakness and headaches.  Endo/Heme/Allergies:  Does not bruise/bleed easily.  Psychiatric/Behavioral:  Negative for depression and suicidal ideas. The patient does not have insomnia.    Allergies  Allergen Reactions   Aleve [Naproxen Sodium] Other (See Comments)    BURN STOMACH   Aspirin Other (See Comments)    BURN STOMACH   Chicken Protein     Dizziness   Codeine Nausea And Vomiting   Naproxen Other (See Comments)    Stomach burns     Past Medical History:  Diagnosis Date   Actinic  keratosis    Allergy    Breast cancer ([place]) 06/11/2019   DCIS   Cancer [facility])    left breast   Family history of breast cancer    Family history of lung cancer    Hearing loss    Hernia, abdominal    showed up end of April 2018   History of herpes zoster 11/05/2015    Seasonal allergies     Past Surgical History:  Procedure Laterality Date   ARTHRODESIS METATARSALPHALANGEAL JOINT (MTPJ) Left 06/03/2016   Procedure: ARTHRODESIS METATARSALPHALANGEAL JOINT (MTPJ);  Surgeon: Albertine Patricia, DPM;  Location: [facility];  Service: Podiatry;  Laterality: Left;   BREAST BIOPSY Left 06/08/2019   U/S guided   BREAST LUMPECTOMY Left 06/29/2019   DCIS   BREAST LUMPECTOMY WITH SENTINEL LYMPH NODE BIOPSY Left 06/29/2019   Procedure: BREAST LUMPECTOMY WITH SENTINEL LYMPH NODE BX;  Surgeon: Robert Bellow, MD;  Location: [facility];  Service: General;  Laterality: Left;   CESAREAN SECTION     COLONOSCOPY     COLONOSCOPY WITH PROPOFOL N/A 01/04/2020   Procedure: COLONOSCOPY WITH PROPOFOL;  Surgeon: Robert Bellow, MD;  Location: [facility] ENDOSCOPY;  Service: Endoscopy;  Laterality: N/A;   EYE SURGERY Bilateral    X 2   HAMMER TOE SURGERY Left 06/03/2016   Procedure: HAMMER TOE CORRECTION - 2nd left toe;  Surgeon: Albertine Patricia, DPM;  Location: [facility];  Service: Podiatry;  Laterality: Left;   TUBAL LIGATION     WEIL OSTEOTOMY Left 06/03/2016   Procedure: WEIL OSTEOTOMY  SECOND TOE;  Surgeon: Albertine Patricia, DPM;  Location: [facility];  Service: Podiatry;  Laterality: Left;    Social History   Socioeconomic History   Marital status: Married    Spouse name: Not on file   Number of children: 1   Years of education: Not on file   Highest education level: Bachelor's degree (e.g., BA, AB, BS)  Occupational History   Occupation: retired  Tobacco Use   Smoking status: Former    Years: 10.00    Types: Cigarettes    Quit date: 1988    Years since quitting: 34.6   Smokeless tobacco: Never   Tobacco comments:    Quit about 30 years ago  Media planner   Vaping Use: Never used  Substance and Sexual Activity   Alcohol use: Yes    Comment: occasionally, 1x/month   Drug use: No   Sexual activity: Never  Other Topics Concern   Not  on file  Social History Narrative   Not on file   Social Determinants of Health   Financial Resource Strain: Low Risk    Difficulty of Paying Living Expenses: Not hard at all  Food Insecurity: No Food Insecurity   Worried About Charity fundraiser in the Last Year: Never true   [place] in the Last Year: Never true  Transportation Needs: No Transportation Needs   Lack of Transportation (Medical): No   Lack of Transportation (Non-Medical): No  Physical Activity: Inactive   Days of Exercise per Week: 0 days   Minutes of Exercise per Session: 0 min  Stress: No Stress Concern Present   Feeling of Stress : Only a little  Social Connections: Moderately Isolated   Frequency of Communication with Friends and Family: More than three times a week   Frequency of Social Gatherings with Friends and Family: Three times a week   Attends Religious Services: Never   Active  Member of Clubs or Organizations: No   Attends Music therapist: Never   Marital Status: Married  Human resources officer Violence: Not At Risk   Fear of Current or Ex-Partner: No   Emotionally Abused: No   Physically Abused: No   Sexually Abused: No    Family History  Problem Relation Age of Onset   Dementia Mother    Dementia Father    Hypertension Father    Heart disease Father    Lung cancer Father    Breast cancer Sister 15   Lung cancer Maternal Aunt    Lung cancer Maternal Uncle      Current Outpatient Medications:    anastrozole (ARIMIDEX) 1 MG tablet, Take 1 tablet (1 mg total) by mouth daily., Disp: , Rfl:    Calcium Carbonate-Vitamin D 600-400 MG-UNIT tablet, Take 2 tablets by mouth daily., Disp: , Rfl:    cetirizine (ZYRTEC) 10 MG tablet, Take 10 mg by mouth daily as needed (allergies). , Disp: , Rfl:    fluticasone (FLONASE) 50 MCG/ACT nasal spray, Place 1 spray into both nostrils daily as needed for allergies or rhinitis., Disp: , Rfl:   No results found.  No images are attached to  the encounter.   CMP Latest Ref Rng & Units 03/31/2020  Glucose 65 - 99 mg/dL 92  BUN 8 - 27 mg/dL 12  Creatinine 0.57 - 1.00 mg/dL 0.62  Sodium 134 - 144 mmol/L 139  Potassium 3.5 - 5.2 mmol/L 4.4  Chloride 96 - 106 mmol/L 99  CO2 20 - 29 mmol/L 24  Calcium 8.7 - 10.3 mg/dL 9.8  Total Protein 6.0 - 8.5 g/dL 7.0  Total Bilirubin 0.0 - 1.2 mg/dL 0.5  Alkaline Phos 44 - 121 IU/L 87  AST 0 - 40 IU/L 20  ALT 0 - 32 IU/L 25   CBC Latest Ref Rng & Units 03/31/2020  WBC 3.4 - 10.8 x10E3/uL 6.4  Hemoglobin 11.1 - 15.9 g/dL 13.2  Hematocrit 34.0 - 46.6 % 37.9  Platelets 150 - 450 x10E3/uL 209     Observation/objective: Appears in no acute distress over video visit today.  Breathing is nonlabored  Assessment and plan: Patient is a 70 year old female with history of stage IT2 a N0 M0 grade 3 ER/PR positive HER2 negative left breast cancer status postsurgery and adjuvant radiation treatment.  She did not require any adjuvant chemotherapy based on her Oncotype score.  She is currently on Arimidex and this is a routine follow-up visit  Clinically patient isArimidex along with calcium and vitamin D well without any significant side effects.  Recent mammogram from doing well and reports no new breast concerns.  She is tolerating May 2022 was normal and subsequent mammograms will be coordinated by Dr. Dwyane Luo office.  I will see her back in 6 months for an in person breast exam  Follow-up instructions: As above  I discussed the assessment and treatment plan with the patient. The patient was provided an opportunity to ask questions and all were answered. The patient agreed with the plan and demonstrated an understanding of the instructions.   The patient was advised to call back or seek an in-person evaluation if the symptoms worsen or if the condition fails to improve as anticipated.  Visit Diagnosis: 1. Visit for monitoring Arimidex therapy   2. Encounter for follow-up surveillance of breast  cancer     Dr. Randa Evens, MD, MPH [facility] Tel- [phone] 09/04/2020 7:48 AM

## 2020-10-07 ENCOUNTER — Ambulatory Visit (INDEPENDENT_AMBULATORY_CARE_PROVIDER_SITE_OTHER): Payer: Medicare Other | Admitting: Dermatology

## 2020-10-07 ENCOUNTER — Other Ambulatory Visit: Payer: Self-pay

## 2020-10-07 ENCOUNTER — Encounter: Payer: Self-pay | Admitting: Dermatology

## 2020-10-07 DIAGNOSIS — D492 Neoplasm of unspecified behavior of bone, soft tissue, and skin: Secondary | ICD-10-CM

## 2020-10-07 DIAGNOSIS — D0439 Carcinoma in situ of skin of other parts of face: Secondary | ICD-10-CM

## 2020-10-07 DIAGNOSIS — C4492 Squamous cell carcinoma of skin, unspecified: Secondary | ICD-10-CM

## 2020-10-07 DIAGNOSIS — D099 Carcinoma in situ, unspecified: Secondary | ICD-10-CM

## 2020-10-07 HISTORY — DX: Carcinoma in situ, unspecified: D09.9

## 2020-10-07 HISTORY — DX: Squamous cell carcinoma of skin, unspecified: C44.92

## 2020-10-07 NOTE — Patient Instructions (Addendum)
Wound Care Instructions  Cleanse wound gently with soap and water once a day then pat dry with clean gauze. Apply a thing coat of Petrolatum (petroleum jelly, "Vaseline") over the wound (unless you have an allergy to this). We recommend that you use a new, sterile tube of Vaseline. Do not pick or remove scabs. Do not remove the yellow or white "healing tissue" from the base of the wound.  Cover the wound with fresh, clean, nonstick gauze and secure with paper tape. You may use Band-Aids in place of gauze and tape if the would is small enough, but would recommend trimming much of the tape off as there is often too much. Sometimes Band-Aids can irritate the skin.  You should call the office for your biopsy report after 1 week if you have not already been contacted.  If you experience any problems, such as abnormal amounts of bleeding, swelling, significant bruising, significant pain, or evidence of infection, please call the office immediately.  FOR ADULT SURGERY PATIENTS: If you need something for pain relief you may take 1 extra strength Tylenol (acetaminophen) AND 2 Ibuprofen (200mg each) together every 4 hours as needed for pain. (do not take these if you are allergic to them or if you have a reason you should not take them.) Typically, you may only need pain medication for 1 to 3 days.   If you have any questions or concerns for your doctor, please call our main line at [phone] and press option 4 to reach your doctor's medical assistant. If no one answers, please leave a voicemail as directed and we will return your call as soon as possible. Messages left after 4 pm will be answered the following business day.   You may also send us a message via MyChart. We typically respond to MyChart messages within 1-2 business days.  For prescription refills, please ask your pharmacy to contact our office. Our fax number is [phone].  If you have an urgent issue when the clinic is closed that  cannot wait until the next business day, you can page your doctor at the number below.    Please note that while we do our best to be available for urgent issues outside of office hours, we are not available 24/7.   If you have an urgent issue and are unable to reach us, you may choose to seek medical care at your doctor's office, retail clinic, urgent care center, or emergency room.  If you have a medical emergency, please immediately call 911 or go to the emergency department.  Pager Numbers  - Dr. Kowalski: [phone]  - Dr. Moye: [phone]  - Dr. Stewart: [phone]  In the event of inclement weather, please call our main line at [phone] for an update on the status of any delays or closures.  Dermatology Medication Tips: Please keep the boxes that topical medications come in in order to help keep track of the instructions about where and how to use these. Pharmacies typically print the medication instructions only on the boxes and not directly on the medication tubes.   If your medication is too expensive, please contact our office at [phone] option 4 or send us a message through MyChart.   We are unable to tell what your co-pay for medications will be in advance as this is different depending on your insurance coverage. However, we may be able to find a substitute medication at lower cost or fill out paperwork to get insurance to cover a needed   medication.   If a prior authorization is required to get your medication covered by your insurance company, please allow us 1-2 business days to complete this process.  Drug prices often vary depending on where the prescription is filled and some pharmacies may offer cheaper prices.  The website www.goodrx.com contains coupons for medications through different pharmacies. The prices here do not account for what the cost may be with help from insurance (it may be cheaper with your insurance), but the website can give you the  price if you did not use any insurance.  - You can print the associated coupon and take it with your prescription to the pharmacy.  - You may also stop by our office during regular business hours and pick up a GoodRx coupon card.  - If you need your prescription sent electronically to a different pharmacy, notify our office through [facility] MyChart or by phone at [phone] option 4.   

## 2020-10-07 NOTE — Progress Notes (Signed)
   Follow-Up Visit   Subjective  Elizabeth Morgan is a 70 y.o. female who presents for the following: Skin Problem (4 months f/u on scaly places on her face, treated with LN2 several times and not gone away ).   The following portions of the chart were reviewed this encounter and updated as appropriate:   Tobacco  Allergies  Meds  Problems  Med Hx  Surg Hx  Fam Hx      Review of Systems:  No other skin or systemic complaints except as noted in HPI or Assessment and Plan.  Objective  Well appearing patient in no apparent distress; mood and affect are within normal limits.  A focused examination was performed including left lateral zygoma/face. Relevant physical exam findings are noted in the Assessment and Plan.  left lateral zygoma 0.5 cm scaly pink papule        upper mid forehead 0.35 cm pink papule with glomerular vessels         Assessment & Plan  Neoplasm of skin (2) left lateral zygoma  Skin / nail biopsy Type of biopsy: tangential   Informed consent: discussed and consent obtained   Timeout: patient name, date of birth, surgical site, and procedure verified   Procedure prep:  Patient was prepped and draped in usual sterile fashion Prep type:  Isopropyl alcohol Anesthesia: the lesion was anesthetized in a standard fashion   Anesthetic:  1% lidocaine w/ epinephrine 1-100,000 buffered w/ 8.4% NaHCO3 Instrument used: flexible razor blade   Hemostasis achieved with: aluminum chloride   Outcome: patient tolerated procedure well   Post-procedure details: wound care instructions given   Additional details:  Petrolatum and a pressure bandage applied  Specimen 1 - Surgical pathology Differential Diagnosis: R/O SCCis  Check Margins: No  upper mid forehead  Skin / nail biopsy Type of biopsy: tangential   Informed consent: discussed and consent obtained   Timeout: patient name, date of birth, surgical site, and procedure verified   Procedure prep:   Patient was prepped and draped in usual sterile fashion Prep type:  Isopropyl alcohol Anesthesia: the lesion was anesthetized in a standard fashion   Anesthetic:  1% lidocaine w/ epinephrine 1-100,000 buffered w/ 8.4% NaHCO3 Instrument used: flexible razor blade   Hemostasis achieved with: aluminum chloride   Outcome: patient tolerated procedure well   Post-procedure details: wound care instructions given   Additional details:  Petrolatum and a pressure bandage applied  Specimen 2 - Surgical pathology Differential Diagnosis: R/O SCCis   Check Margins: No  Biopsy left lateral zygoma given persistence despite previous LN2 treatments  Biopsy of forehead given glomerular vessels on dermoscopy  Return if symptoms worsen or fail to improve.  I, Marye Round, CMA, am acting as scribe for Forest Gleason, MD .   Documentation: I have reviewed the above documentation for accuracy and completeness, and I agree with the above.  Forest Gleason, MD

## 2020-10-15 ENCOUNTER — Other Ambulatory Visit: Payer: Self-pay

## 2020-10-15 ENCOUNTER — Encounter: Payer: Self-pay | Admitting: Radiation Oncology

## 2020-10-15 ENCOUNTER — Ambulatory Visit
Admission: RE | Admit: 2020-10-15 | Discharge: 2020-10-15 | Disposition: A | Discharge status: Home / Self Care | Payer: Medicare Other | Source: Ambulatory Visit | Attending: Radiation Oncology | Admitting: Radiation Oncology

## 2020-10-15 VITALS — BP 136/68 | HR 65 | Temp 97.2°F | Resp 18 | Wt 165.8 lb

## 2020-10-15 DIAGNOSIS — C50912 Malignant neoplasm of unspecified site of left female breast: Secondary | ICD-10-CM | POA: Diagnosis not present

## 2020-10-15 DIAGNOSIS — Z923 Personal history of irradiation: Secondary | ICD-10-CM | POA: Insufficient documentation

## 2020-10-15 DIAGNOSIS — Z79811 Long term (current) use of aromatase inhibitors: Secondary | ICD-10-CM | POA: Insufficient documentation

## 2020-10-15 DIAGNOSIS — C4492 Squamous cell carcinoma of skin, unspecified: Secondary | ICD-10-CM

## 2020-10-15 DIAGNOSIS — Z853 Personal history of malignant neoplasm of breast: Secondary | ICD-10-CM | POA: Diagnosis not present

## 2020-10-15 DIAGNOSIS — Z170 Estrogen receptor positive status [ER+]: Secondary | ICD-10-CM | POA: Insufficient documentation

## 2020-10-15 DIAGNOSIS — Z08 Encounter for follow-up examination after completed treatment for malignant neoplasm: Secondary | ICD-10-CM | POA: Diagnosis not present

## 2020-10-15 DIAGNOSIS — C50919 Malignant neoplasm of unspecified site of unspecified female breast: Secondary | ICD-10-CM

## 2020-10-15 NOTE — Progress Notes (Unsigned)
l °

## 2020-10-15 NOTE — Progress Notes (Signed)
Radiation Oncology Follow up Note  Name: Elizabeth Morgan   Date:   10/15/2020 MRN:  [id number] DOB: Apr 21, 1950    This 70 y.o. female presents to the clinic today for 1 year follow-up status post whole breast radiation to her left breast for stage II ER/PR positive invasive mammary carcinoma.  REFERRING PROVIDER: Jerrol Banana.,*  HPI: Patient is a 70 year old female now seen at 1 year having completed whole breast radiation to her left breast for stage II (T2N0 ER/PR positive invasive mammary carcinoma.  Seen today in routine follow-up she is doing well.  She specifically denies breast tenderness cough or bone pain.  She met had mammograms back in May which I have reviewed were BI-RADS 2 benign..  She is currently on Arimidex tolerant at well without side effect. COMPLICATIONS OF TREATMENT: none  FOLLOW UP COMPLIANCE: keeps appointments   PHYSICAL EXAM:  BP 136/68 (BP Location: Left Arm, Patient Position: Sitting)   Pulse 65   Temp (!) 97.2 F (36.2 C) (Tympanic)   Resp 18   Wt 165 lb 12.8 oz (75.2 kg)   BMI 29.37 kg/m  Lungs are clear to A&P cardiac examination essentially unremarkable with regular rate and rhythm. No dominant mass or nodularity is noted in either breast in 2 positions examined. Incision is well-healed. No axillary or supraclavicular adenopathy is appreciated. Cosmetic result is excellent.  Well-developed well-nourished patient in NAD. HEENT reveals PERLA, EOMI, discs not visualized.  Oral cavity is clear. No oral mucosal lesions are identified. Neck is clear without evidence of cervical or supraclavicular adenopathy. Lungs are clear to A&P. Cardiac examination is essentially unremarkable with regular rate and rhythm without murmur rub or thrill. Abdomen is benign with no organomegaly or masses noted. Motor sensory and DTR levels are equal and symmetric in the upper and lower extremities. Cranial nerves II through XII are grossly intact. Proprioception is intact.  No peripheral adenopathy or edema is identified. No motor or sensory levels are noted. Crude visual fields are within normal range.  RADIOLOGY RESULTS: Mammograms reviewed compatible with above-stated findings  PLAN: Present time patient is doing well with no evidence of disease and now out 1 years.  And pleased with her overall progress.  She continues on Arimidex without side effect.  I have asked to see her back in 1 year for follow-up.  Patient knows to call with any concerns.  I would like to take this opportunity to thank you for allowing me to participate in the care of your patient.Noreene Filbert, MD

## 2020-10-15 NOTE — Progress Notes (Signed)
Referral sent to [facility]./js

## 2020-11-23 IMAGING — MG US BREAST BX W LOC DEV 1ST LESION IMG BX SPEC US GUIDE*L*
1 series · 8 of 8 positions shown · non-contrast
Comparison: Previous exam(s).
COMPARISON: Previous exam(s).

Addendum:
CLINICAL DATA: Biopsy of a left breast mass

EXAM:
ULTRASOUND GUIDED LEFT BREAST CORE NEEDLE BIOPSY

[Series 1: MG view · 0.06mm/px · 8 of 14 slices shown]
[im 1/14]
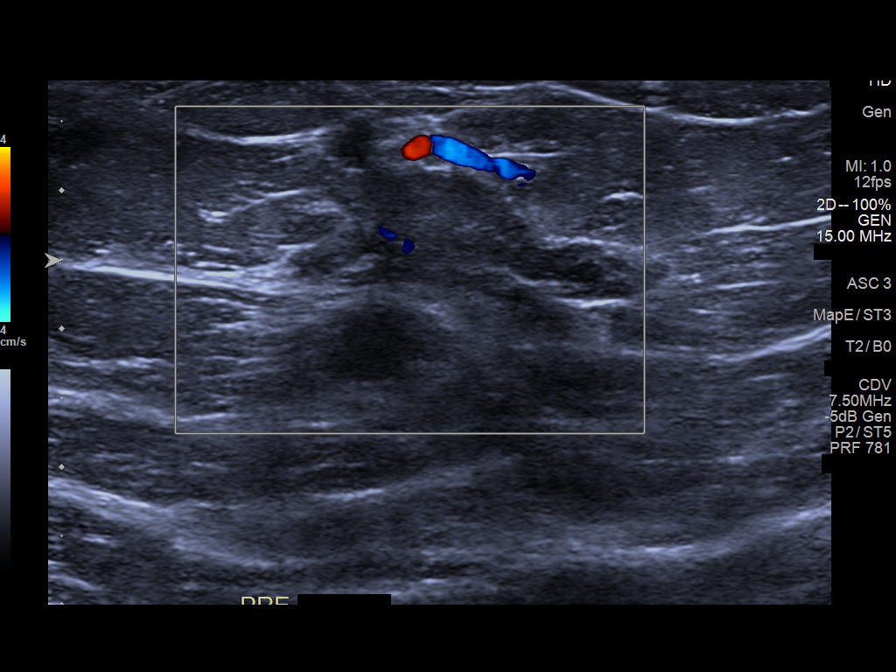
[im 2/14]
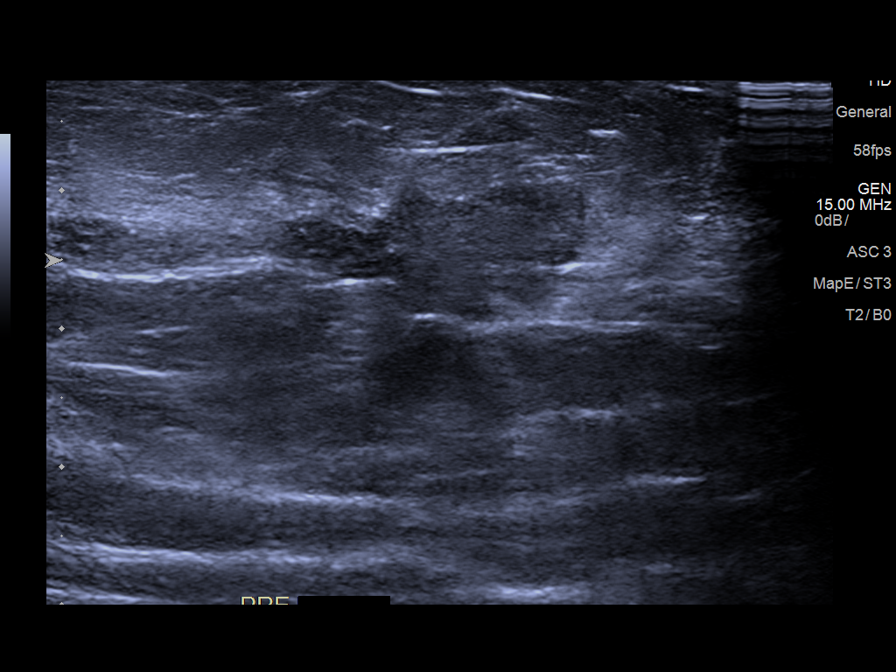
[im 4/14]
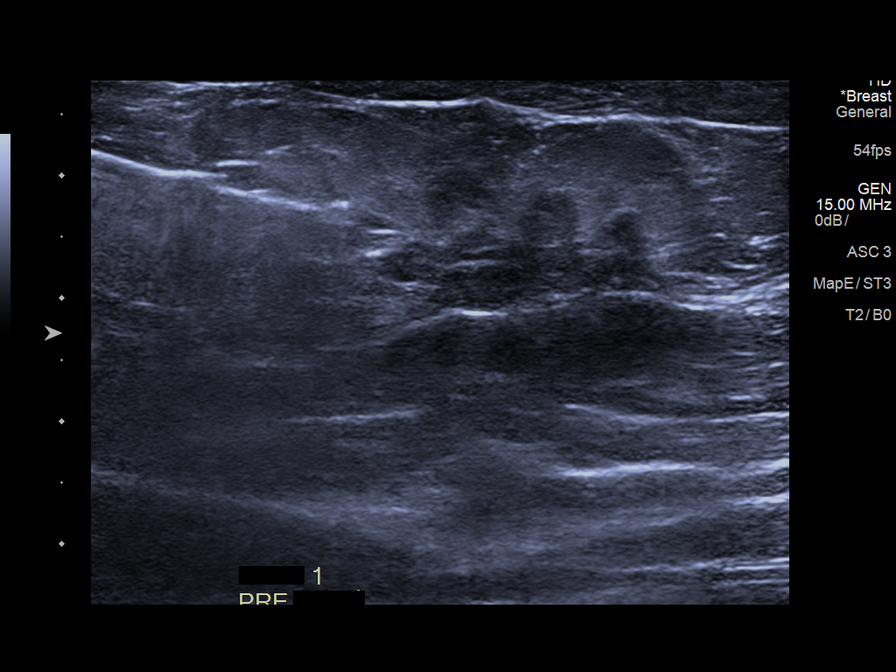
[im 6/14]
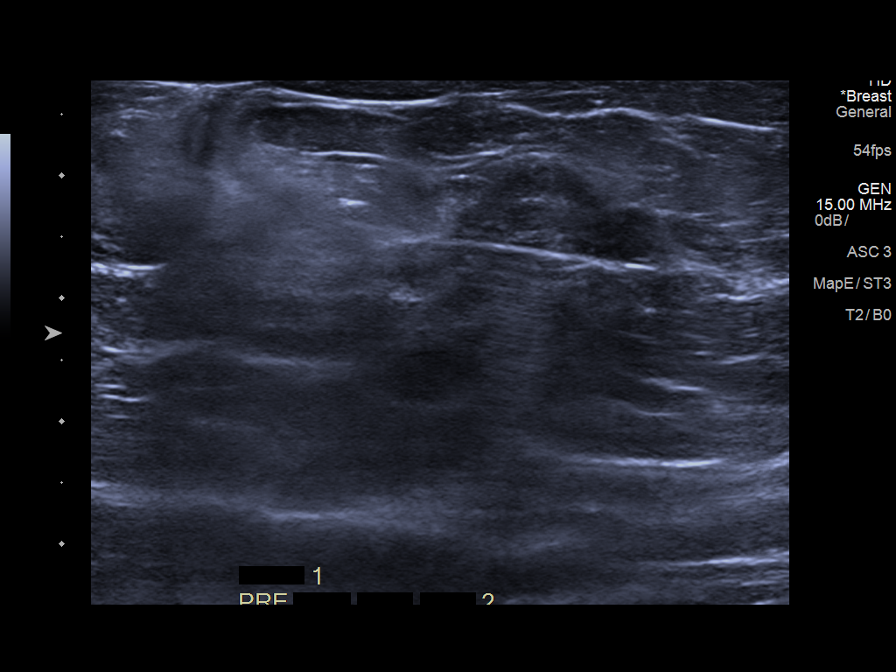
[im 8/14]
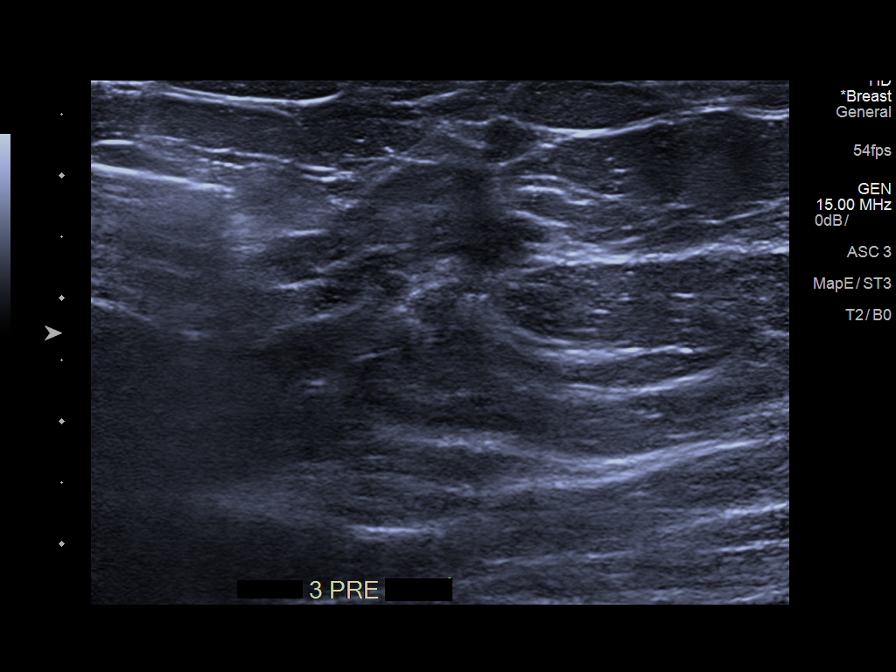
[im 10/14]
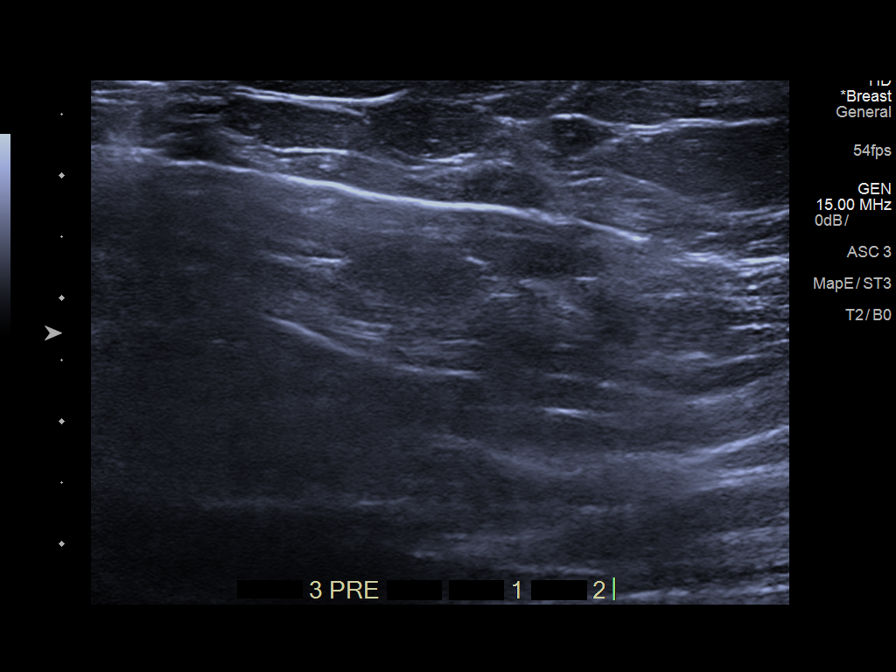
[im 12/14]
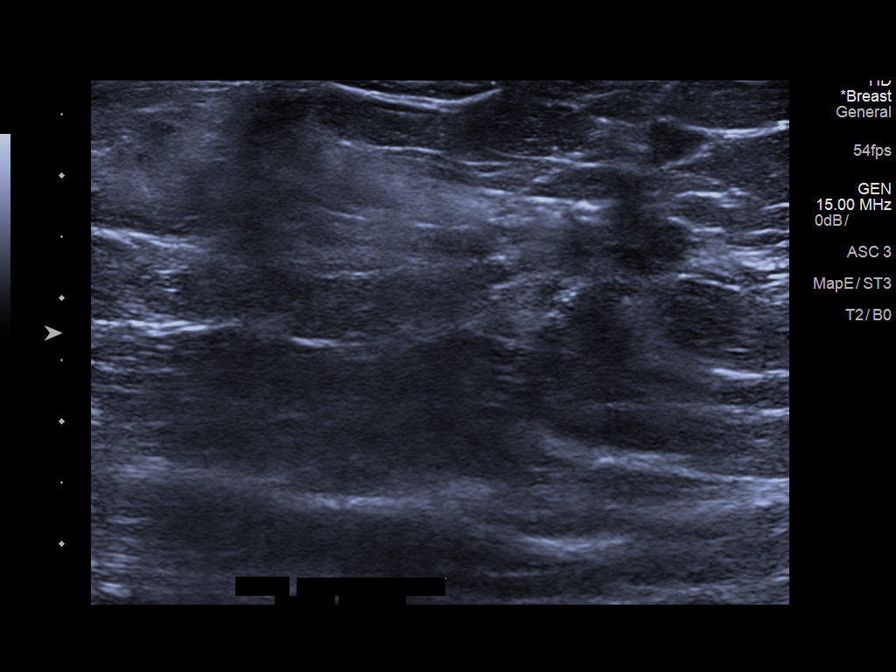
[im 14/14]
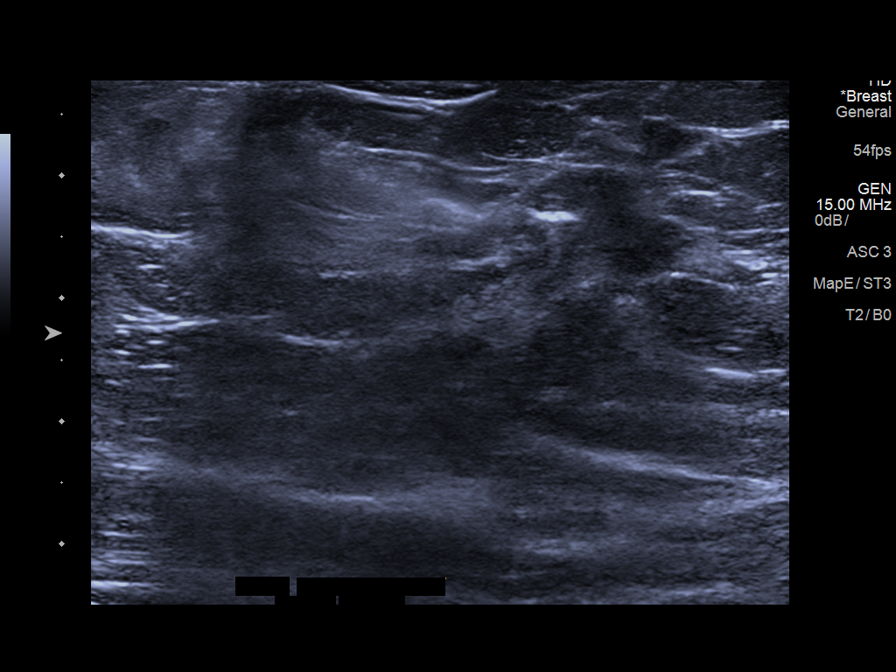

[8 of 8 positions shown; findings below may reference images not displayed]



Lesion quadrant: 12 o'clock

Using sterile technique and 1% Lidocaine as local anesthetic, under
direct ultrasound visualization, a 12 gauge Bibbs device was
used to perform biopsy of a 12 o'clock left breast mass using a
lateral approach. At the conclusion of the procedure coil shaped
tissue marker clip was deployed into the biopsy cavity. Follow up 2
view mammogram was performed and dictated separately.
IMPRESSION: Ultrasound guided biopsy of a left breast mass at 12. No apparent
complications.

ADDENDUM:
PATHOLOGY revealed: A. BREAST, LEFT [DATE]; ULTRASOUND-GUIDED BIOPSY:
- INVASIVE MAMMARY CARCINOMA WITH FOCAL MUCINOUS FEATURES. 15 mm in
this sample. Grade 3. Ductal carcinoma in situ: Not identified.
Lymphovascular invasion: Not identified.

Pathology results are CONCORDANT with imaging findings, per Dr.
Avesh Jaryal.

Pathology results and recommendations below were discussed with
patient by telephone on 06/11/2019. Patient reported biopsy site
doing well with slight tenderness at the site. Post biopsy care
instructions were reviewed and questions were answered. Patient was
instructed to call [HOSPITAL] if any concerns or
questions arise related to the biopsy.

Recommendation: Surgical referral. Request for surgical referral was
relayed to Arthi Vora RN and Teiki Sol RN at [HOSPITAL]
[HOSPITAL] by Felix Aink Ing Ming RN on 06/11/2019.

Addendum by Felix Aink Ing Ming RN on 06/11/2019.



Lesion quadrant: 12 o'clock

Using sterile technique and 1% Lidocaine as local anesthetic, under
direct ultrasound visualization, a 12 gauge Bibbs device was
used to perform biopsy of a 12 o'clock left breast mass using a
lateral approach. At the conclusion of the procedure coil shaped
tissue marker clip was deployed into the biopsy cavity. Follow up 2
view mammogram was performed and dictated separately.
IMPRESSION: Ultrasound guided biopsy of a left breast mass at 12. No apparent
complications.

## 2020-12-01 DIAGNOSIS — D0439 Carcinoma in situ of skin of other parts of face: Secondary | ICD-10-CM | POA: Diagnosis not present

## 2020-12-03 ENCOUNTER — Other Ambulatory Visit: Payer: Self-pay

## 2020-12-03 ENCOUNTER — Ambulatory Visit (INDEPENDENT_AMBULATORY_CARE_PROVIDER_SITE_OTHER): Payer: Medicare Other | Admitting: Dermatology

## 2020-12-03 ENCOUNTER — Encounter: Payer: Self-pay | Admitting: Dermatology

## 2020-12-03 DIAGNOSIS — L720 Epidermal cyst: Secondary | ICD-10-CM | POA: Diagnosis not present

## 2020-12-03 DIAGNOSIS — L821 Other seborrheic keratosis: Secondary | ICD-10-CM | POA: Diagnosis not present

## 2020-12-03 DIAGNOSIS — D229 Melanocytic nevi, unspecified: Secondary | ICD-10-CM | POA: Diagnosis not present

## 2020-12-03 DIAGNOSIS — Z86007 Personal history of in-situ neoplasm of skin: Secondary | ICD-10-CM

## 2020-12-03 DIAGNOSIS — L814 Other melanin hyperpigmentation: Secondary | ICD-10-CM | POA: Diagnosis not present

## 2020-12-03 DIAGNOSIS — D1800 Hemangioma unspecified site: Secondary | ICD-10-CM

## 2020-12-03 DIAGNOSIS — L578 Other skin changes due to chronic exposure to nonionizing radiation: Secondary | ICD-10-CM | POA: Diagnosis not present

## 2020-12-03 DIAGNOSIS — Z1283 Encounter for screening for malignant neoplasm of skin: Secondary | ICD-10-CM

## 2020-12-03 NOTE — Progress Notes (Signed)
   Follow-Up Visit   Subjective  Elizabeth Morgan is a 70 y.o. female who presents for the following: FBSE (Patient here for full body skin exam and skin cancer screening. Patient with hx of SCC and AK's. She is not aware of any new or changing spots. ).  The following portions of the chart were reviewed this encounter and updated as appropriate:   Tobacco  Allergies  Meds  Problems  Med Hx  Surg Hx  Fam Hx      Review of Systems:  No other skin or systemic complaints except as noted in HPI or Assessment and Plan.  Objective  Well appearing patient in no apparent distress; mood and affect are within normal limits.  A full examination was performed including scalp, head, eyes, ears, nose, lips, neck, chest, axillae, abdomen, back, buttocks, bilateral upper extremities, bilateral lower extremities, hands, feet, fingers, toes, fingernails, and toenails. All findings within normal limits unless otherwise noted below.  Mid Back left of midline Subcutaneous nodule.    Assessment & Plan  Epidermal inclusion cyst Mid Back left of midline  Benign-appearing. Exam most consistent with an epidermal inclusion cyst. Discussed that a cyst is a benign growth that can grow over time and sometimes get irritated or inflamed. Recommend observation if it is not bothersome. Discussed option of surgical excision to remove it if it is growing, symptomatic, or other changes noted. Please call for new or changing lesions so they can be evaluated.  Lentigines - Scattered tan macules - Due to sun exposure - Benign-appearing, observe - Recommend daily broad spectrum sunscreen SPF 30+ to sun-exposed areas, reapply every 2 hours as needed. - Call for any changes  Seborrheic Keratoses - Stuck-on, waxy, tan-brown papules and/or plaques  - Benign-appearing - Discussed benign etiology and prognosis. - Observe - Call for any changes  Melanocytic Nevi - Tan-brown and/or pink-flesh-colored symmetric  macules and papules - Benign appearing on exam today - Observation - Call clinic for new or changing moles - Recommend daily use of broad spectrum spf 30+ sunscreen to sun-exposed areas.   Hemangiomas - Red papules - Discussed benign nature - Observe - Call for any changes  Actinic Damage - Chronic condition, secondary to cumulative UV/sun exposure - diffuse scaly erythematous macules with underlying dyspigmentation - Recommend daily broad spectrum sunscreen SPF 30+ to sun-exposed areas, reapply every 2 hours as needed.  - Staying in the shade or wearing long sleeves, sun glasses (UVA+UVB protection) and wide brim hats (4-inch brim around the entire circumference of the hat) are also recommended for sun protection.  - Call for new or changing lesions.  Skin cancer screening performed today.  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  - Call if any new or changing lesions are noted between office visits   Return in about 6 months (around 06/02/2021) for TBSE with Dr. Nicole Kindred.  Graciella Belton, RMA, am acting as scribe for Forest Gleason, MD .  Documentation: I have reviewed the above documentation for accuracy and completeness, and I agree with the above.  Forest Gleason, MD

## 2020-12-03 NOTE — Patient Instructions (Signed)

## 2020-12-15 DIAGNOSIS — D049 Carcinoma in situ of skin, unspecified: Secondary | ICD-10-CM | POA: Diagnosis not present

## 2020-12-15 DIAGNOSIS — D485 Neoplasm of uncertain behavior of skin: Secondary | ICD-10-CM | POA: Diagnosis not present

## 2020-12-15 DIAGNOSIS — D0439 Carcinoma in situ of skin of other parts of face: Secondary | ICD-10-CM | POA: Diagnosis not present

## 2020-12-15 DIAGNOSIS — Z481 Encounter for planned postprocedural wound closure: Secondary | ICD-10-CM | POA: Diagnosis not present

## 2021-01-22 DIAGNOSIS — H2513 Age-related nuclear cataract, bilateral: Secondary | ICD-10-CM | POA: Diagnosis not present

## 2021-02-10 ENCOUNTER — Telehealth: Payer: Self-pay

## 2021-02-10 NOTE — Telephone Encounter (Signed)
Updated specimen tracking and history. aw 

## 2021-02-26 DIAGNOSIS — Z20822 Contact with and (suspected) exposure to covid-19: Secondary | ICD-10-CM | POA: Diagnosis not present

## 2021-03-06 ENCOUNTER — Inpatient Hospital Stay: Payer: Medicare Other | Admitting: Oncology

## 2021-03-11 ENCOUNTER — Ambulatory Visit (INDEPENDENT_AMBULATORY_CARE_PROVIDER_SITE_OTHER): Payer: Medicare Other

## 2021-03-11 DIAGNOSIS — Z0000 Encounter for general adult medical examination without abnormal findings: Secondary | ICD-10-CM | POA: Diagnosis not present

## 2021-03-11 NOTE — Patient Instructions (Addendum)
Ms. Elizabeth Morgan , Thank you for taking time to come for your Medicare Wellness Visit. I appreciate your ongoing commitment to your health goals. Please review the following plan we discussed and let me know if I can assist you in the future.   Screening recommendations/referrals: Colonoscopy: 01/04/20 Mammogram: 05/23/20 Bone Density: 05/21/19 Recommended yearly ophthalmology/optometry visit for glaucoma screening and checkup Recommended yearly dental visit for hygiene and checkup  Vaccinations: Influenza vaccine: n/d Pneumococcal vaccine: n/d Tdap vaccine: 04/22/19 Shingles vaccine: n/d   Covid-19:03/13/19, 04/03/19, 11/13/19  Advanced directives: no  Conditions/risks identified: none  Next appointment: Follow up in one year for your annual wellness visit - 03/15/22 @ 9:40am by phone   Preventive Care 65 Years and Older, Female Preventive care refers to lifestyle choices and visits with your health care provider that can promote health and wellness. What does preventive care include? A yearly physical exam. This is also called an annual well check. Dental exams once or twice a year. Routine eye exams. Ask your health care provider how often you should have your eyes checked. Personal lifestyle choices, including: Daily care of your teeth and gums. Regular physical activity. Eating a healthy diet. Avoiding tobacco and drug use. Limiting alcohol use. Practicing safe sex. Taking low-dose aspirin every day. Taking vitamin and mineral supplements as recommended by your health care provider. What happens during an annual well check? The services and screenings done by your health care provider during your annual well check will depend on your age, overall health, lifestyle risk factors, and family history of disease. Counseling  Your health care provider may ask you questions about your: Alcohol use. Tobacco use. Drug use. Emotional well-being. Home and relationship well-being. Sexual  activity. Eating habits. History of falls. Memory and ability to understand (cognition). Work and work Statistician. Reproductive health. Screening  You may have the following tests or measurements: Height, weight, and BMI. Blood pressure. Lipid and cholesterol levels. These may be checked every 5 years, or more frequently if you are over 24 years old. Skin check. Lung cancer screening. You may have this screening every year starting at age 6 if you have a 30-pack-year history of smoking and currently smoke or have quit within the past 15 years. Fecal occult blood test (FOBT) of the stool. You may have this test every year starting at age 1. Flexible sigmoidoscopy or colonoscopy. You may have a sigmoidoscopy every 5 years or a colonoscopy every 10 years starting at age 84. Hepatitis C blood test. Hepatitis B blood test. Sexually transmitted disease (STD) testing. Diabetes screening. This is done by checking your blood sugar (glucose) after you have not eaten for a while (fasting). You may have this done every 1-3 years. Bone density scan. This is done to screen for osteoporosis. You may have this done starting at age 64. Mammogram. This may be done every 1-2 years. Talk to your health care provider about how often you should have regular mammograms. Talk with your health care provider about your test results, treatment options, and if necessary, the need for more tests. Vaccines  Your health care provider may recommend certain vaccines, such as: Influenza vaccine. This is recommended every year. Tetanus, diphtheria, and acellular pertussis (Tdap, Td) vaccine. You may need a Td booster every 10 years. Zoster vaccine. You may need this after age 51. Pneumococcal 13-valent conjugate (PCV13) vaccine. One dose is recommended after age 39. Pneumococcal polysaccharide (PPSV23) vaccine. One dose is recommended after age 12. Talk to your health care  provider about which screenings and vaccines  you need and how often you need them. This information is not intended to replace advice given to you by your health care provider. Make sure you discuss any questions you have with your health care provider. Document Released: 01/31/2015 Document Revised: 09/24/2015 Document Reviewed: 11/05/2014 Elsevier Interactive Patient Education  2017 [place] Prevention in the Home Falls can cause injuries. They can happen to people of all ages. There are many things you can do to make your home safe and to help prevent falls. What can I do on the outside of my home? Regularly fix the edges of walkways and driveways and fix any cracks. Remove anything that might make you trip as you walk through a door, such as a raised step or threshold. Trim any bushes or trees on the path to your home. Use bright outdoor lighting. Clear any walking paths of anything that might make someone trip, such as rocks or tools. Regularly check to see if handrails are loose or broken. Make sure that both sides of any steps have handrails. Any raised decks and porches should have guardrails on the edges. Have any leaves, snow, or ice cleared regularly. Use sand or salt on walking paths during winter. Clean up any spills in your garage right away. This includes oil or grease spills. What can I do in the bathroom? Use night lights. Install grab bars by the toilet and in the tub and shower. Do not use towel bars as grab bars. Use non-skid mats or decals in the tub or shower. If you need to sit down in the shower, use a plastic, non-slip stool. Keep the floor dry. Clean up any water that spills on the floor as soon as it happens. Remove soap buildup in the tub or shower regularly. Attach bath mats securely with double-sided non-slip rug tape. Do not have throw rugs and other things on the floor that can make you trip. What can I do in the bedroom? Use night lights. Make sure that you have a light by your bed that  is easy to reach. Do not use any sheets or blankets that are too big for your bed. They should not hang down onto the floor. Have a firm chair that has side arms. You can use this for support while you get dressed. Do not have throw rugs and other things on the floor that can make you trip. What can I do in the kitchen? Clean up any spills right away. Avoid walking on wet floors. Keep items that you use a lot in easy-to-reach places. If you need to reach something above you, use a strong step stool that has a grab bar. Keep electrical cords out of the way. Do not use floor polish or wax that makes floors slippery. If you must use wax, use non-skid floor wax. Do not have throw rugs and other things on the floor that can make you trip. What can I do with my stairs? Do not leave any items on the stairs. Make sure that there are handrails on both sides of the stairs and use them. Fix handrails that are broken or loose. Make sure that handrails are as long as the stairways. Check any carpeting to make sure that it is firmly attached to the stairs. Fix any carpet that is loose or worn. Avoid having throw rugs at the top or bottom of the stairs. If you do have throw rugs, attach them to the  floor with carpet tape. Make sure that you have a light switch at the top of the stairs and the bottom of the stairs. If you do not have them, ask someone to add them for you. What else can I do to help prevent falls? Wear shoes that: Do not have high heels. Have rubber bottoms. Are comfortable and fit you well. Are closed at the toe. Do not wear sandals. If you use a stepladder: Make sure that it is fully opened. Do not climb a closed stepladder. Make sure that both sides of the stepladder are locked into place. Ask someone to hold it for you, if possible. Clearly mark and make sure that you can see: Any grab bars or handrails. First and last steps. Where the edge of each step is. Use tools that help you  move around (mobility aids) if they are needed. These include: Canes. Walkers. Scooters. Crutches. Turn on the lights when you go into a dark area. Replace any light bulbs as soon as they burn out. Set up your furniture so you have a clear path. Avoid moving your furniture around. If any of your floors are uneven, fix them. If there are any pets around you, be aware of where they are. Review your medicines with your doctor. Some medicines can make you feel dizzy. This can increase your chance of falling. Ask your doctor what other things that you can do to help prevent falls. This information is not intended to replace advice given to you by your health care provider. Make sure you discuss any questions you have with your health care provider. Document Released: 10/31/2008 Document Revised: 06/12/2015 Document Reviewed: 02/08/2014 Elsevier Interactive Patient Education  2017 [employer].

## 2021-03-11 NOTE — Progress Notes (Signed)
Virtual Visit via Telephone Note  I connected with  Elizabeth Morgan on 03/11/21 at  9:40 AM EST by telephone and verified that I am speaking with the correct person using two identifiers.  Location: Patient: home Provider: [facility] Persons participating in the virtual visit: [place]   I discussed the limitations, risks, security and privacy concerns of performing an evaluation and management service by telephone and the availability of in person appointments. The patient expressed understanding and agreed to proceed.  Interactive audio and video telecommunications were attempted between this nurse and patient, however failed, due to patient having technical difficulties OR patient did not have access to video capability.  We continued and completed visit with audio only.  Some vital signs may be absent or patient reported.   Dionisio David, LPN  Subjective:   Elizabeth Morgan is a 71 y.o. female who presents for Medicare Annual (Subsequent) preventive examination.  Review of Systems           Objective:    There were no vitals filed for this visit. There is no height or weight on file to calculate BMI.  Advanced Directives 10/15/2020 03/05/2020 03/04/2020 01/04/2020 11/12/2019 07/13/2019 06/26/2019  Does Patient Have a Medical Advance Directive? No No No No Yes No No  Type of Advance Directive - - - - Press photographer - -  Does patient want to make changes to medical advance directive? - - - - - - -  Would patient like information on creating a medical advance directive? No - Patient declined No - Patient declined - - - No - Patient declined No - Patient declined    Current Medications (verified) Outpatient Encounter Medications as of 03/11/2021  Medication Sig   anastrozole (ARIMIDEX) 1 MG tablet Take 1 tablet (1 mg total) by mouth daily.   Calcium Carbonate-Vitamin D 600-400 MG-UNIT tablet Take 2 tablets by mouth daily.   cetirizine (ZYRTEC) 10  MG tablet Take 10 mg by mouth daily as needed (allergies).    fluticasone (FLONASE) 50 MCG/ACT nasal spray Place 1 spray into both nostrils daily as needed for allergies or rhinitis.   No facility-administered encounter medications on file as of 03/11/2021.    Allergies (verified) Aleve [naproxen sodium], Aspirin, Chicken protein, Codeine, and Naproxen   History: Past Medical History:  Diagnosis Date   Actinic keratosis    Allergy    Breast cancer ([place]) 06/11/2019   DCIS   Cancer ([place])    left breast   Family history of breast cancer    Family history of lung cancer    Hearing loss    Hernia, abdominal    showed up end of April 2018   History of herpes zoster 11/05/2015   Seasonal allergies    Squamous cell carcinoma in situ (SCCIS) 10/07/2020   left lateral zygoma Moh's 12/01/2020   Squamous cell carcinoma in situ (SCCIS) 10/07/2020   upper mid forehead, MOHs completed 12/15/20   Past Surgical History:  Procedure Laterality Date   ARTHRODESIS METATARSALPHALANGEAL JOINT (MTPJ) Left 06/03/2016   Procedure: ARTHRODESIS METATARSALPHALANGEAL JOINT (MTPJ);  Surgeon: Albertine Patricia, DPM;  Location: [place];  Service: Podiatry;  Laterality: Left;   BREAST BIOPSY Left 06/08/2019   U/S guided   BREAST LUMPECTOMY Left 06/29/2019   DCIS   BREAST LUMPECTOMY WITH SENTINEL LYMPH NODE BIOPSY Left 06/29/2019   Procedure: BREAST LUMPECTOMY WITH SENTINEL LYMPH NODE BX;  Surgeon: Robert Bellow, MD;  Location: [facility];  Service: General;  Laterality: Left;   CESAREAN SECTION     COLONOSCOPY     COLONOSCOPY WITH PROPOFOL N/A 01/04/2020   Procedure: COLONOSCOPY WITH PROPOFOL;  Surgeon: Robert Bellow, MD;  Location: [facility];  Service: Endoscopy;  Laterality: N/A;   EYE SURGERY Bilateral    X 2   HAMMER TOE SURGERY Left 06/03/2016   Procedure: HAMMER TOE CORRECTION - 2nd left toe;  Surgeon: Albertine Patricia, DPM;  Location: [place];  Service: Podiatry;   Laterality: Left;   TUBAL LIGATION     WEIL OSTEOTOMY Left 06/03/2016   Procedure: WEIL OSTEOTOMY  SECOND TOE;  Surgeon: Albertine Patricia, DPM;  Location: [place];  Service: Podiatry;  Laterality: Left;   Family History  Problem Relation Age of Onset   Dementia Mother    Dementia Father    Hypertension Father    Heart disease Father    Lung cancer Father    Breast cancer Sister 54   Lung cancer Maternal Aunt    Lung cancer Maternal Uncle    Social History   Socioeconomic History   Marital status: Married    Spouse name: Not on file   Number of children: 1   Years of education: Not on file   Highest education level: Bachelor's degree (e.g., BA, AB, BS)  Occupational History   Occupation: retired  Tobacco Use   Smoking status: Former    Years: 10.00    Types: Cigarettes    Quit date: 1988    Years since quitting: 35.1   Smokeless tobacco: Never   Tobacco comments:    Quit about 30 years ago  Media planner   Vaping Use: Never used  Substance and Sexual Activity   Alcohol use: Yes    Comment: occasionally, 1x/month   Drug use: No   Sexual activity: Never  Other Topics Concern   Not on file  Social History Narrative   Not on file   Social Determinants of Health   Financial Resource Strain: Not on file  Food Insecurity: Not on file  Transportation Needs: Not on file  Physical Activity: Not on file  Stress: Not on file  Social Connections: Not on file    Tobacco Counseling Counseling given: Not Answered Tobacco comments: Quit about 30 years ago   Clinical Intake:  Pre-visit preparation completed: Yes  Pain : No/denies pain     Nutritional Risks: None Diabetes: No  How often do you need to have someone help you when you read instructions, pamphlets, or other written materials from your doctor or pharmacy?: 1 - Never  Diabetic?no  Interpreter Needed?: No  Information entered by :: Kirke Shaggy, LPN   Activities of Daily Living No  flowsheet data found.  Patient Care Team: Jerrol Banana., MD as PCP - General (Family Medicine) Bary Castilla Forest Gleason, MD as Consulting Physician (General Surgery) Sindy Guadeloupe, MD as Consulting Physician (Oncology) Noreene Filbert, MD as Referring Physician (Radiation Oncology) Theodore Demark, RN as Oncology Nurse Navigator  Indicate any recent Medical Services you may have received from other than [facility] providers in the past year (date may be approximate).     Assessment:   This is a routine wellness examination for Elizabeth Morgan.  Hearing/Vision screen No results found.  Dietary issues and exercise activities discussed:     Goals Addressed   None    Depression Screen PHQ 2/9 Scores 03/05/2020 02/28/2019 02/07/2018 02/03/2017 02/03/2016  PHQ - 2 Score 0 0 0  0 0  PHQ- 9 Score - - 0 0 -    Fall Risk Fall Risk  03/05/2020 02/28/2019 02/07/2018 02/03/2016  Falls in the past year? 1 0 0 Yes  Number falls in past yr: 0 0 0 1  Injury with Fall? 0 0 0 Yes  Comment - - - slight cut on chin  Risk for fall due to : No Fall Risks - - -  Follow up Falls prevention discussed - - -    FALL RISK PREVENTION PERTAINING TO THE HOME:  Any stairs in or around the home? Yes  If so, are there any without handrails? No  Home free of loose throw rugs in walkways, pet beds, electrical cords, etc? Yes  Adequate lighting in your home to reduce risk of falls? Yes   ASSISTIVE DEVICES UTILIZED TO PREVENT FALLS:  Life alert? No  Use of a cane, walker or w/c? No  Grab bars in the bathroom? No  Shower chair or bench in shower? No  Elevated toilet seat or a handicapped toilet? No    Cognitive Function:Normal cognitive status assessed by direct observation by this Nurse Health Advisor. No abnormalities found.          Immunizations Immunization History  Administered Date(s) Administered   PFIZER(Purple Top)SARS-COV-2 Vaccination 03/13/2019, 04/03/2019, 11/13/2019   Td 05/27/2003, 04/22/2019    Tdap 02/03/2017    TDAP status: Up to date  Flu Vaccine status: Declined, Education has been provided regarding the importance of this vaccine but patient still declined. Advised may receive this vaccine at local pharmacy or Health Dept. Aware to provide a copy of the vaccination record if obtained from local pharmacy or Health Dept. Verbalized acceptance and understanding.  Pneumococcal vaccine status: Declined,  Education has been provided regarding the importance of this vaccine but patient still declined. Advised may receive this vaccine at local pharmacy or Health Dept. Aware to provide a copy of the vaccination record if obtained from local pharmacy or Health Dept. Verbalized acceptance and understanding.   Covid-19 vaccine status: Completed vaccines  Qualifies for Shingles Vaccine? Yes   Zostavax completed No   Shingrix Completed?: No.    Education has been provided regarding the importance of this vaccine. Patient has been advised to call insurance company to determine out of pocket expense if they have not yet received this vaccine. Advised may also receive vaccine at local pharmacy or Health Dept. Verbalized acceptance and understanding.  Screening Tests Health Maintenance  Topic Date Due   URINE MICROALBUMIN  Never done   Zoster Vaccines- Shingrix (1 of 2) Never done   Pneumonia Vaccine 56+ Years old (1 - PCV) Never done   COVID-19 Vaccine (4 - Booster for Pfizer series) 01/08/2020   INFLUENZA VACCINE  Never done   MAMMOGRAM  05/24/2022   COLONOSCOPY (Pts 45-69yrs Insurance coverage will need to be confirmed)  01/03/2025   TETANUS/TDAP  04/21/2029   DEXA SCAN  Completed   Hepatitis C Screening  Completed   HPV VACCINES  Aged Out    Health Maintenance  Health Maintenance Due  Topic Date Due   URINE MICROALBUMIN  Never done   Zoster Vaccines- Shingrix (1 of 2) Never done   Pneumonia Vaccine 80+ Years old (1 - PCV) Never done   COVID-19 Vaccine (4 - Booster for [place]  series) 01/08/2020   INFLUENZA VACCINE  Never done    Colorectal cancer screening: Type of screening: Colonoscopy. Completed 01/04/20. Repeat every 5 years  Mammogram status: Completed  05/23/20. Repeat every year  Bone Density status: Completed 05/21/19. Results reflect: Bone density results: NORMAL. Repeat every 5 years.  Lung Cancer Screening: (Low Dose CT Chest recommended if Age 75-80 years, 30 pack-year currently smoking OR have quit w/in 15years.) does not qualify.    Additional Screening:  Hepatitis C Screening: does qualify; Completed 04/27/12  Vision Screening: Recommended annual ophthalmology exams for early detection of glaucoma and other disorders of the eye. Is the patient up to date with their annual eye exam?  Yes  Who is the provider or what is the name of the office in which the patient attends annual eye exams? Dr.King If pt is not established with a provider, would they like to be referred to a provider to establish care? No .   Dental Screening: Recommended annual dental exams for proper oral hygiene  Community Resource Referral / Chronic Care Management: CRR required this visit?  No   CCM required this visit?  No      Plan:     I have personally reviewed and noted the following in the patient's chart:   Medical and social history Use of alcohol, tobacco or illicit drugs  Current medications and supplements including opioid prescriptions.  Functional ability and status Nutritional status Physical activity Advanced directives List of other physicians Hospitalizations, surgeries, and ER visits in previous 12 months Vitals Screenings to include cognitive, depression, and falls Referrals and appointments  In addition, I have reviewed and discussed with patient certain preventive protocols, quality metrics, and best practice recommendations. A written personalized care plan for preventive services as well as general preventive health recommendations were  provided to patient.     Dionisio David, LPN   3/83/3383   Nurse Notes: none

## 2021-03-25 ENCOUNTER — Other Ambulatory Visit: Payer: Self-pay

## 2021-03-25 ENCOUNTER — Inpatient Hospital Stay: Payer: Medicare Other | Attending: Oncology | Admitting: Oncology

## 2021-03-25 ENCOUNTER — Encounter: Payer: Self-pay | Admitting: Oncology

## 2021-03-25 VITALS — BP 129/68 | HR 72 | Temp 97.3°F | Resp 16 | Wt 168.1 lb

## 2021-03-25 DIAGNOSIS — Z801 Family history of malignant neoplasm of trachea, bronchus and lung: Secondary | ICD-10-CM | POA: Diagnosis not present

## 2021-03-25 DIAGNOSIS — Z803 Family history of malignant neoplasm of breast: Secondary | ICD-10-CM | POA: Diagnosis not present

## 2021-03-25 DIAGNOSIS — Z87891 Personal history of nicotine dependence: Secondary | ICD-10-CM | POA: Insufficient documentation

## 2021-03-25 DIAGNOSIS — Z853 Personal history of malignant neoplasm of breast: Secondary | ICD-10-CM | POA: Diagnosis not present

## 2021-03-25 DIAGNOSIS — Z5181 Encounter for therapeutic drug level monitoring: Secondary | ICD-10-CM

## 2021-03-25 DIAGNOSIS — Z08 Encounter for follow-up examination after completed treatment for malignant neoplasm: Secondary | ICD-10-CM | POA: Diagnosis not present

## 2021-03-25 DIAGNOSIS — C50919 Malignant neoplasm of unspecified site of unspecified female breast: Secondary | ICD-10-CM

## 2021-03-25 DIAGNOSIS — Z79811 Long term (current) use of aromatase inhibitors: Secondary | ICD-10-CM

## 2021-03-25 MED ORDER — ANASTROZOLE 1 MG PO TABS
1.0000 mg | ORAL_TABLET | Freq: Every day | ORAL | Status: DC
Start: 2021-03-25 — End: 2021-03-25

## 2021-03-25 MED ORDER — ANASTROZOLE 1 MG PO TABS
1.0000 mg | ORAL_TABLET | Freq: Every day | ORAL | Status: DC
Start: 2021-03-25 — End: 2021-03-26

## 2021-03-26 ENCOUNTER — Other Ambulatory Visit: Payer: Self-pay | Admitting: *Deleted

## 2021-03-26 MED ORDER — ANASTROZOLE 1 MG PO TABS: 1.0000 mg | ORAL_TABLET | Freq: Every day | ORAL | 0 refills | Status: DC

## 2021-03-27 NOTE — Progress Notes (Signed)
Hematology/Oncology Consult note [facility]  Telephone:([phone] [phone]  Patient Care Team: Jerrol Banana., MD as PCP - General (Family Medicine) Bary Castilla Forest Gleason, MD as Consulting Physician (General Surgery) Sindy Guadeloupe, MD as Consulting Physician (Oncology) Noreene Filbert, MD as Referring Physician (Radiation Oncology) Theodore Demark, RN as Oncology Nurse Navigator   Name of the patient: Elizabeth Morgan  [id number]  04-May-1950   Date of visit: 03/27/21  Diagnosis-pathological prognosticstage Ia invasive mammary carcinoma of the left breast pT2 pN0 cM0 grade 3 ER/PR positive HER-2/neu negative s/p lumpectomy  Chief complaint/ Reason for visit-routine follow-up of breast cancer  Heme/Onc history: Patient is a 71 year old female who underwent a recent screening mammogram on 05/21/2019 which showed abnormality in the left breast.  This was followed by diagnostic mammogram and ultrasound which showed a 2.4 x 1.4 x 1.9 cm mass in the left breast.  No lymphadenopathy seen in the left axilla.  Core biopsy showed a 15 mm grade 3 invasive mammary carcinoma with focal mucinous features.  ER/PR and HER-2 status was not available at the time of her visit   Patient is currently doing well for her age she does not have any significant comorbidities.  She is independent of her ADLs and IADLs.Patient sister had breast cancer at the age of 61.   Final pathology showed invasive mammary carcinoma with negative margins 2.4 x 2.8 x 1.5 cm, grade 3 ER/PR positive HER-2 -1 sentinel lymph node negative for malignancy.   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  At her age this does not translate into absolute chemotherapy benefit.  Patient therefore did not require any adjuvant chemotherapy. Patient completed adjuvant radiation therapy and started Arimidex in September  2021  Interval history-patient is doing well overall.  Tolerating Arimidex along with calcium and vitamin D without any significant side effects.  Denies any specific breast concerns at this time.  ECOG PS- 0 Pain scale- 0   Review of systems- Review of Systems  Constitutional:  Negative for chills, fever, malaise/fatigue and weight loss.  HENT:  Negative for congestion, ear discharge and nosebleeds.   Eyes:  Negative for blurred vision.  Respiratory:  Negative for cough, hemoptysis, sputum production, shortness of breath and wheezing.   Cardiovascular:  Negative for chest pain, palpitations, orthopnea and claudication.  Gastrointestinal:  Negative for abdominal pain, blood in stool, constipation, diarrhea, heartburn, melena, nausea and vomiting.  Genitourinary:  Negative for dysuria, flank pain, frequency, hematuria and urgency.  Musculoskeletal:  Negative for back pain, joint pain and myalgias.  Skin:  Negative for rash.  Neurological:  Negative for dizziness, tingling, focal weakness, seizures, weakness and headaches.  Endo/Heme/Allergies:  Does not bruise/bleed easily.  Psychiatric/Behavioral:  Negative for depression and suicidal ideas. The patient does not have insomnia.      Allergies  Allergen Reactions   Aleve [Naproxen Sodium] Other (See Comments)    BURN STOMACH   Aspirin Other (See Comments)    BURN STOMACH   Chicken Protein     Dizziness   Codeine Nausea And Vomiting   Naproxen Other (See Comments)    Stomach burns      Past Medical History:  Diagnosis Date   Actinic keratosis    Allergy    Breast cancer ([place]) 06/11/2019   DCIS   Cancer ([place])    left breast   Family history  of breast cancer    Family history of lung cancer    Hearing loss    Hernia, abdominal    showed up end of April 2018   History of herpes zoster 11/05/2015   Seasonal allergies    Squamous cell carcinoma in situ (SCCIS) 10/07/2020   left lateral zygoma Moh's 12/01/2020   Squamous  cell carcinoma in situ (SCCIS) 10/07/2020   upper mid forehead, MOHs completed 12/15/20     Past Surgical History:  Procedure Laterality Date   ARTHRODESIS METATARSALPHALANGEAL JOINT (MTPJ) Left 06/03/2016   Procedure: ARTHRODESIS METATARSALPHALANGEAL JOINT (MTPJ);  Surgeon: Albertine Patricia, DPM;  Location: [place];  Service: Podiatry;  Laterality: Left;   BREAST BIOPSY Left 06/08/2019   U/S guided   BREAST LUMPECTOMY Left 06/29/2019   DCIS   BREAST LUMPECTOMY WITH SENTINEL LYMPH NODE BIOPSY Left 06/29/2019   Procedure: BREAST LUMPECTOMY WITH SENTINEL LYMPH NODE BX;  Surgeon: Robert Bellow, MD;  Location: [facility];  Service: General;  Laterality: Left;   CESAREAN SECTION     COLONOSCOPY     COLONOSCOPY WITH PROPOFOL N/A 01/04/2020   Procedure: COLONOSCOPY WITH PROPOFOL;  Surgeon: Robert Bellow, MD;  Location: [facility];  Service: Endoscopy;  Laterality: N/A;   EYE SURGERY Bilateral    X 2   HAMMER TOE SURGERY Left 06/03/2016   Procedure: HAMMER TOE CORRECTION - 2nd left toe;  Surgeon: Albertine Patricia, DPM;  Location: [place];  Service: Podiatry;  Laterality: Left;   TUBAL LIGATION     WEIL OSTEOTOMY Left 06/03/2016   Procedure: WEIL OSTEOTOMY  SECOND TOE;  Surgeon: Albertine Patricia, DPM;  Location: [place];  Service: Podiatry;  Laterality: Left;    Social History   Socioeconomic History   Marital status: Married    Spouse name: Not on file   Number of children: 1   Years of education: Not on file   Highest education level: Bachelor's degree (e.g., BA, AB, BS)  Occupational History   Occupation: retired  Tobacco Use   Smoking status: Former    Years: 10.00    Types: Cigarettes    Quit date: 1988    Years since quitting: 35.2   Smokeless tobacco: Never   Tobacco comments:    Quit about 30 years ago  Media planner   Vaping Use: Never used  Substance and Sexual Activity   Alcohol use: Yes    Comment: occasionally, 1x/month    Drug use: No   Sexual activity: Never  Other Topics Concern   Not on file  Social History Narrative   Not on file   Social Determinants of Health   Financial Resource Strain: Low Risk    Difficulty of Paying Living Expenses: Not hard at all  Food Insecurity: No Food Insecurity   Worried About Charity fundraiser in the Last Year: Never true   [place] in the Last Year: Never true  Transportation Needs: No Transportation Needs   Lack of Transportation (Medical): No   Lack of Transportation (Non-Medical): No  Physical Activity: Insufficiently Active   Days of Exercise per Week: 3 days   Minutes of Exercise per Session: 30 min  Stress: No Stress Concern Present   Feeling of Stress : Not at all  Social Connections: Moderately Isolated   Frequency of Communication with Friends and Family: More than three times a week   Frequency of Social Gatherings with Friends and Family: Once a week   Attends  Religious Services: Never   Active Member of Clubs or Organizations: No   Attends Archivist Meetings: Never   Marital Status: Married  Human resources officer Violence: Not At Risk   Fear of Current or Ex-Partner: No   Emotionally Abused: No   Physically Abused: No   Sexually Abused: No    Family History  Problem Relation Age of Onset   Dementia Mother    Dementia Father    Hypertension Father    Heart disease Father    Lung cancer Father    Breast cancer Sister 68   Lung cancer Maternal Aunt    Lung cancer Maternal Uncle      Current Outpatient Medications:    Calcium Carbonate-Vitamin D 600-400 MG-UNIT tablet, Take 2 tablets by mouth daily., Disp: , Rfl:    cetirizine (ZYRTEC) 10 MG tablet, Take 10 mg by mouth daily as needed (allergies). , Disp: , Rfl:    fluticasone (FLONASE) 50 MCG/ACT nasal spray, Place 1 spray into both nostrils daily as needed for allergies or rhinitis., Disp: , Rfl:    anastrozole (ARIMIDEX) 1 MG tablet, Take 1 tablet (1 mg total) by  mouth daily., Disp: 90 tablet, Rfl: 0  Physical exam:  Vitals:   03/25/21 1110  BP: 129/68  Pulse: 72  Resp: 16  Temp: (!) 97.3 F (36.3 C)  SpO2: 96%  Weight: 168 lb 1.6 oz (76.2 kg)   Physical Exam Constitutional:      General: She is not in acute distress. Cardiovascular:     Rate and Rhythm: Normal rate and regular rhythm.     Heart sounds: Normal heart sounds.  Pulmonary:     Effort: Pulmonary effort is normal.     Breath sounds: Normal breath sounds.  Abdominal:     General: Bowel sounds are normal.     Palpations: Abdomen is soft.  Skin:    General: Skin is warm and dry.  Neurological:     Mental Status: She is alert and oriented to person, place, and time.   Breast exam was performed in seated and lying down position. Patient is status post left lumpectomy with a well-healed surgical scar. No evidence of any palpable masses. No evidence of axillary adenopathy. No evidence of any palpable masses or lumps in the right breast. No evidence of right axillary adenopathy   CMP Latest Ref Rng & Units 03/31/2020  Glucose 65 - 99 mg/dL 92  BUN 8 - 27 mg/dL 12  Creatinine 0.57 - 1.00 mg/dL 0.62  Sodium 134 - 144 mmol/L 139  Potassium 3.5 - 5.2 mmol/L 4.4  Chloride 96 - 106 mmol/L 99  CO2 20 - 29 mmol/L 24  Calcium 8.7 - 10.3 mg/dL 9.8  Total Protein 6.0 - 8.5 g/dL 7.0  Total Bilirubin 0.0 - 1.2 mg/dL 0.5  Alkaline Phos 44 - 121 IU/L 87  AST 0 - 40 IU/L 20  ALT 0 - 32 IU/L 25   CBC Latest Ref Rng & Units 03/31/2020  WBC 3.4 - 10.8 x10E3/uL 6.4  Hemoglobin 11.1 - 15.9 g/dL 13.2  Hematocrit 34.0 - 46.6 % 37.9  Platelets 150 - 450 x10E3/uL 209     Assessment and plan- Patient is a 71 y.o. female with history of stage I left breast cancer ER/PR positive HER2 negative s/p lumpectomy and adjuvant radiation therapy currently on Arimidex and here for routine follow-up  Patient is tolerating Arimidex along with calcium and vitamin D well without any significant side  effects.  Clinically doing well with no concerning signs and symptoms of recurrence based on today's exam.  She will be due for a mammogram in May 2023 which will be coordinated by Dr. Bary Castilla.  I will see her back in 6 months with no labs.  She will need a bone density scan prior   Visit Diagnosis 1. Encounter for follow-up surveillance of breast cancer   2. Visit for monitoring Arimidex therapy      Dr. Randa Evens, MD, MPH [facility] at [facility] [phone] 03/27/2021 5:11 PM

## 2021-04-02 ENCOUNTER — Other Ambulatory Visit: Payer: Self-pay

## 2021-04-02 ENCOUNTER — Encounter: Payer: Self-pay | Admitting: Family Medicine

## 2021-04-02 ENCOUNTER — Ambulatory Visit (INDEPENDENT_AMBULATORY_CARE_PROVIDER_SITE_OTHER): Payer: Medicare Other | Admitting: Family Medicine

## 2021-04-02 VITALS — BP 120/63 | HR 69 | Temp 98.6°F | Resp 16 | Ht 63.0 in | Wt 168.0 lb

## 2021-04-02 DIAGNOSIS — E785 Hyperlipidemia, unspecified: Secondary | ICD-10-CM

## 2021-04-02 DIAGNOSIS — I10 Essential (primary) hypertension: Secondary | ICD-10-CM | POA: Diagnosis not present

## 2021-04-02 DIAGNOSIS — Z780 Asymptomatic menopausal state: Secondary | ICD-10-CM | POA: Diagnosis not present

## 2021-04-02 DIAGNOSIS — J301 Allergic rhinitis due to pollen: Secondary | ICD-10-CM

## 2021-04-02 DIAGNOSIS — Z13820 Encounter for screening for osteoporosis: Secondary | ICD-10-CM

## 2021-04-02 NOTE — Progress Notes (Signed)
?  ? ? ?Established patient visit ? ?I,April Miller,acting as a scribe for Wilhemena Durie, MD.,have documented all relevant documentation on the behalf of Wilhemena Durie, MD,as directed by  Wilhemena Durie, MD while in the presence of Wilhemena Durie, MD. ? ? ?Patient: Elizabeth Morgan   DOB: Feb 03, 1950   71 y.o. Female  MRN: [id number] ?Visit Date: 04/02/2021 ? ?Today's healthcare provider: Wilhemena Durie, MD  ? ?Chief Complaint  ?Patient presents with  ? Follow-up  ? Hypertension  ? Hyperlipidemia  ? ?Subjective  ?  ?HPI  ?Patient comes in today for follow-up of chronic medical problems.  She has a history of breast cancer allergic rhinitis hyperlipidemia ?Overall she is tolerating medications and feeling fairly well.  Her only complaint recently is some allergy symptoms with occasional postnasal drainage leading to some cough and some phlegm production and occasional hoarseness. ?Patient had AWV with [facility] on 03/11/2021. ? ?Hypertension, follow-up ? ?BP Readings from Last 3 Encounters:  ?04/02/21 120/63  ?03/25/21 129/68  ?10/15/20 136/68  ? Wt Readings from Last 3 Encounters:  ?04/02/21 168 lb (76.2 kg)  ?03/25/21 168 lb 1.6 oz (76.2 kg)  ?10/15/20 165 lb 12.8 oz (75.2 kg)  ?  ? ?She was last seen for hypertension 1 years ago.  ?BP at that visit was 134/67. ?Management since that visit includes; Good control with diet and exercise. ?She reports good compliance with treatment. ?She is not having side effects. none ?She is exercising. ?She is adherent to low salt diet.   ?Outside blood pressures are not checking. ? ?She does not smoke. ? ?Use of agents associated with hypertension: none.  ? ?--------------------------------------------------------------------------------------------------- ?Lipid/Cholesterol, follow-up ? ?Last Lipid Panel: ?Lab Results  ?Component Value Date  ? CHOL 229 (H) 03/31/2020  ? LDLCALC 131 (H) 03/31/2020  ? HDL 77 03/31/2020  ? TRIG 122 03/31/2020  ? ? ?She was last seen  for this 1 years ago.  ?Management since that visit includes; labs checked showing-stable. ? ?She reports good compliance with treatment. ?She is not having side effects. none ? ?She is following a Regular diet. ?Current exercise: walking ? ?Last metabolic panel ?Lab Results  ?Component Value Date  ? GLUCOSE 92 03/31/2020  ? NA 139 03/31/2020  ? K 4.4 03/31/2020  ? BUN 12 03/31/2020  ? CREATININE 0.62 03/31/2020  ? EGFR 98 03/31/2020  ? GFRNONAA >60 11/09/2019  ? CALCIUM 9.8 03/31/2020  ? AST 20 03/31/2020  ? ALT 25 03/31/2020  ? ?The 10-year ASCVD risk score (Arnett DK, et al., 2019) is: 12.2% ? ?--------------------------------------------------------------------------------------------------- ? ? ?Medications: ?Outpatient Medications Prior to Visit  ?Medication Sig  ? anastrozole (ARIMIDEX) 1 MG tablet Take 1 tablet (1 mg total) by mouth daily.  ? Calcium Carbonate-Vitamin D 600-400 MG-UNIT tablet Take 2 tablets by mouth daily.  ? cetirizine (ZYRTEC) 10 MG tablet Take 10 mg by mouth daily as needed (allergies).   ? fluticasone (FLONASE) 50 MCG/ACT nasal spray Place 1 spray into both nostrils daily as needed for allergies or rhinitis.  ? ?No facility-administered medications prior to visit.  ? ? ?Review of Systems  ?HENT:  Positive for rhinorrhea, sinus pressure, tinnitus and voice change.   ?Gastrointestinal:  Positive for abdominal distention.  ?Allergic/Immunologic: Positive for environmental allergies.  ?All other systems reviewed and are negative. ? ? ?  Objective  ?  ?BP 120/63 (BP Location: Right Arm, Patient Position: Sitting, Cuff Size: Large)   Pulse 69   Temp 98.6 ?F (  37 ?C) (Temporal)   Resp 16   Ht 5' 3"  (1.6 m)   Wt 168 lb (76.2 kg)   SpO2 98%   BMI 29.76 kg/m?  ? ? ?Physical Exam ?Vitals reviewed.  ?Constitutional:   ?   Appearance: She is well-developed.  ?HENT:  ?   Head: Normocephalic and atraumatic.  ?   Right Ear: External ear normal.  ?   Left Ear: External ear normal.  ?   Nose: Nose  normal.  ?Eyes:  ?   Conjunctiva/sclera: Conjunctivae normal.  ?   Pupils: Pupils are equal, round, and reactive to light.  ?Cardiovascular:  ?   Rate and Rhythm: Normal rate and regular rhythm.  ?   Heart sounds: Normal heart sounds.  ?Pulmonary:  ?   Effort: Pulmonary effort is normal.  ?   Breath sounds: Normal breath sounds.  ?Chest:  ?Breasts: ?   Right: Normal.  ?   Left: Normal.  ?Abdominal:  ?   General: Bowel sounds are normal.  ?   Palpations: Abdomen is soft.  ?Musculoskeletal:  ?   Cervical back: Normal range of motion and neck supple.  ?   Comments: Mild increase in thoracic kyphosis.  ?Skin: ?   General: Skin is warm and dry.  ?Neurological:  ?   General: No focal deficit present.  ?   Mental Status: She is alert and oriented to person, place, and time.  ?   Deep Tendon Reflexes: Reflexes are normal and symmetric.  ?Psychiatric:     ?   Mood and Affect: Mood normal.     ?   Behavior: Behavior normal.     ?   Thought Content: Thought content normal.     ?   Judgment: Judgment normal.  ?  ? ? ?No results found for any visits on 04/02/21. ? Assessment & Plan  ?  ? ? ?1. Essential (primary) hypertension ? ?- Lipid panel ?- TSH ?- CBC w/Diff/Platelet ?- Comprehensive Metabolic Panel (CMET) ? ?2. Hyperlipidemia, unspecified hyperlipidemia type ? ?- Lipid panel ?- TSH ?- CBC w/Diff/Platelet ?- Comprehensive Metabolic Panel (CMET) ? ?3. Screening for osteoporosis ? ?- DG Bone Density ? ?4. Postmenopausal ? ?- DG Bone Density ?5.  Seasonal allergic rhinitis ?Try nasal sinus rinses for her symptoms for now ? ? ?Return in about 1 year (around 04/03/2022).  ?   ? ?I, Wilhemena Durie, MD, have reviewed all documentation for this visit. The documentation on 04/05/21 for the exam, diagnosis, procedures, and orders are all accurate and complete. ? ? ? ?Abel Ra Cranford Mon, MD  ?[facility] ?[phone] (phone) ?[phone] (fax) ? ?[facility] ?

## 2021-04-13 ENCOUNTER — Other Ambulatory Visit: Payer: Self-pay | Admitting: General Surgery

## 2021-04-13 DIAGNOSIS — Z803 Family history of malignant neoplasm of breast: Secondary | ICD-10-CM

## 2021-04-13 DIAGNOSIS — Z1231 Encounter for screening mammogram for malignant neoplasm of breast: Secondary | ICD-10-CM

## 2021-04-13 DIAGNOSIS — C50919 Malignant neoplasm of unspecified site of unspecified female breast: Secondary | ICD-10-CM

## 2021-04-13 DIAGNOSIS — C50611 Malignant neoplasm of axillary tail of right female breast: Secondary | ICD-10-CM

## 2021-04-15 DIAGNOSIS — I10 Essential (primary) hypertension: Secondary | ICD-10-CM | POA: Diagnosis not present

## 2021-04-15 DIAGNOSIS — E785 Hyperlipidemia, unspecified: Secondary | ICD-10-CM | POA: Diagnosis not present

## 2021-04-16 LAB — CBC WITH DIFFERENTIAL/PLATELET
Basophils Absolute: 0.1 10*3/uL (ref 0.0–0.2)
Basos: 1 %
EOS (ABSOLUTE): 0.2 10*3/uL (ref 0.0–0.4)
Eos: 3 %
Hematocrit: 40.8 % (ref 34.0–46.6)
Hemoglobin: 13.7 g/dL (ref 11.1–15.9)
Immature Grans (Abs): 0 10*3/uL (ref 0.0–0.1)
Immature Granulocytes: 0 %
Lymphocytes Absolute: 1.6 10*3/uL (ref 0.7–3.1)
Lymphs: 28 %
MCH: 29.8 pg (ref 26.6–33.0)
MCHC: 33.6 g/dL (ref 31.5–35.7)
MCV: 89 fL (ref 79–97)
Monocytes Absolute: 0.6 10*3/uL (ref 0.1–0.9)
Monocytes: 10 %
Neutrophils Absolute: 3.4 10*3/uL (ref 1.4–7.0)
Neutrophils: 58 %
Platelets: 245 10*3/uL (ref 150–450)
RBC: 4.59 x10E6/uL (ref 3.77–5.28)
RDW: 12 % (ref 11.7–15.4)
WBC: 5.8 10*3/uL (ref 3.4–10.8)

## 2021-04-16 LAB — COMPREHENSIVE METABOLIC PANEL
ALT: 13 IU/L (ref 0–32)
AST: 21 IU/L (ref 0–40)
Albumin/Globulin Ratio: 2 (ref 1.2–2.2)
Albumin: 4.5 g/dL (ref 3.8–4.8)
Alkaline Phosphatase: 93 IU/L (ref 44–121)
BUN/Creatinine Ratio: 17 (ref 12–28)
BUN: 12 mg/dL (ref 8–27)
Bilirubin Total: 0.4 mg/dL (ref 0.0–1.2)
CO2: 25 mmol/L (ref 20–29)
Calcium: 9.5 mg/dL (ref 8.7–10.3)
Chloride: 99 mmol/L (ref 96–106)
Creatinine, Ser: 0.7 mg/dL (ref 0.57–1.00)
Globulin, Total: 2.2 g/dL (ref 1.5–4.5)
Glucose: 91 mg/dL (ref 70–99)
Potassium: 4.6 mmol/L (ref 3.5–5.2)
Sodium: 135 mmol/L (ref 134–144)
Total Protein: 6.7 g/dL (ref 6.0–8.5)
eGFR: 94 mL/min/{1.73_m2} (ref >59)

## 2021-04-16 LAB — LIPID PANEL
Chol/HDL Ratio: 3.2 ratio (ref 0.0–4.4)
Cholesterol, Total: 248 mg/dL — ABNORMAL HIGH (ref 100–199)
HDL: 78 mg/dL (ref >39)
LDL Chol Calc (NIH): 152 mg/dL — ABNORMAL HIGH (ref 0–99)
Triglycerides: 106 mg/dL (ref 0–149)
VLDL Cholesterol Cal: 18 mg/dL (ref 5–40)

## 2021-04-16 LAB — TSH: TSH: 1.41 u[IU]/mL (ref 0.450–4.500)

## 2021-04-28 DIAGNOSIS — Z20822 Contact with and (suspected) exposure to covid-19: Secondary | ICD-10-CM | POA: Diagnosis not present

## 2021-05-21 ENCOUNTER — Other Ambulatory Visit: Payer: Medicare Other

## 2021-06-02 ENCOUNTER — Encounter: Payer: Self-pay | Admitting: Dermatology

## 2021-06-02 ENCOUNTER — Ambulatory Visit (INDEPENDENT_AMBULATORY_CARE_PROVIDER_SITE_OTHER): Payer: Medicare Other | Admitting: Dermatology

## 2021-06-02 DIAGNOSIS — D1801 Hemangioma of skin and subcutaneous tissue: Secondary | ICD-10-CM | POA: Diagnosis not present

## 2021-06-02 DIAGNOSIS — L820 Inflamed seborrheic keratosis: Secondary | ICD-10-CM | POA: Diagnosis not present

## 2021-06-02 DIAGNOSIS — L720 Epidermal cyst: Secondary | ICD-10-CM

## 2021-06-02 DIAGNOSIS — Z86007 Personal history of in-situ neoplasm of skin: Secondary | ICD-10-CM | POA: Diagnosis not present

## 2021-06-02 DIAGNOSIS — L814 Other melanin hyperpigmentation: Secondary | ICD-10-CM | POA: Diagnosis not present

## 2021-06-02 DIAGNOSIS — L578 Other skin changes due to chronic exposure to nonionizing radiation: Secondary | ICD-10-CM

## 2021-06-02 DIAGNOSIS — L821 Other seborrheic keratosis: Secondary | ICD-10-CM | POA: Diagnosis not present

## 2021-06-02 DIAGNOSIS — Z1283 Encounter for screening for malignant neoplasm of skin: Secondary | ICD-10-CM | POA: Diagnosis not present

## 2021-06-02 DIAGNOSIS — L570 Actinic keratosis: Secondary | ICD-10-CM | POA: Diagnosis not present

## 2021-06-02 DIAGNOSIS — D229 Melanocytic nevi, unspecified: Secondary | ICD-10-CM

## 2021-06-02 DIAGNOSIS — I8393 Asymptomatic varicose veins of bilateral lower extremities: Secondary | ICD-10-CM

## 2021-06-02 NOTE — Patient Instructions (Addendum)
Cryotherapy Aftercare ? ?Wash gently with soap and water everyday.   ?Apply Vaseline daily until healed.  ? ?Prior to procedure, discussed risks of blister formation, small wound, skin dyspigmentation, or rare scar following cryotherapy. Recommend Vaseline ointment to treated areas while healing.  ? ? ? ?Recommend daily broad spectrum sunscreen SPF 30+ to sun-exposed areas, reapply every 2 hours as needed. Call for new or changing lesions.  ?Staying in the shade or wearing long sleeves, sun glasses (UVA+UVB protection) and wide brim hats (4-inch brim around the entire circumference of the hat) are also recommended for sun protection.  ? ? ?Melanoma ABCDEs ? ?Melanoma is the most dangerous type of skin cancer, and is the leading cause of death from skin disease.  You are more likely to develop melanoma if you: ?Have light-colored skin, light-colored eyes, or red or blond hair ?Spend a lot of time in the sun ?Tan regularly, either outdoors or in a tanning bed ?Have had blistering sunburns, especially during childhood ?Have a close family member who has had a melanoma ?Have atypical moles or large birthmarks ? ?Early detection of melanoma is key since treatment is typically straightforward and cure rates are extremely high if we catch it early.  ? ?The first sign of melanoma is often a change in a mole or a new dark spot.  The ABCDE system is a way of remembering the signs of melanoma. ? ?A for asymmetry:  The two halves do not match. ?B for border:  The edges of the growth are irregular. ?C for color:  A mixture of colors are present instead of an even brown color. ?D for diameter:  Melanomas are usually (but not always) greater than 23m - the size of a pencil eraser. ?E for evolution:  The spot keeps changing in size, shape, and color. ? ?Please check your skin once per month between visits. You can use a small mirror in front and a large mirror behind you to keep an eye on the back side or your body.  ? ?If you see  any new or changing lesions before your next follow-up, please call to schedule a visit. ? ?Please continue daily skin protection including broad spectrum sunscreen SPF 30+ to sun-exposed areas, reapplying every 2 hours as needed when you're outdoors.  ? ?Staying in the shade or wearing long sleeves, sun glasses (UVA+UVB protection) and wide brim hats (4-inch brim around the entire circumference of the hat) are also recommended for sun protection.   ? ? ?If You Need Anything After Your Visit ? ?If you have any questions or concerns for your doctor, please call our main line at [phone] press option 4 to reach your doctor's medical assistant. If no one answers, please leave a voicemail as directed and we will return your call as soon as possible. Messages left after 4 pm will be answered the following business day.  ? ?You may also send [place] message via MyChart. We typically respond to MyChart messages within 1-2 business days. ? ?For prescription refills, please ask your pharmacy to contact our office. Our fax number is [phone] ? ?If you have an urgent issue when the clinic is closed that cannot wait until the next business day, you can page your doctor at the number below.   ? ?Please note that while we do our best to be available for urgent issues outside of office hours, we are not available 24/7.  ? ?If you have an urgent issue and are  unable to reach [place], you may choose to seek medical care at your doctor's office, retail clinic, urgent care center, or emergency room. ? ?If you have a medical emergency, please immediately call 911 or go to the emergency department. ? ?Pager Numbers ? ?- Dr. Nehemiah Massed: [phone] ? ?- Dr. Laurence Ferrari: [phone] ? ?- Dr. Nicole Kindred: [phone] ? ?In the event of inclement weather, please call our main line at [phone] for an update on the status of any delays or closures. ? ?Dermatology Medication Tips: ?Please keep the boxes that topical medications come in in order  to help keep track of the instructions about where and how to use these. Pharmacies typically print the medication instructions only on the boxes and not directly on the medication tubes.  ? ?If your medication is too expensive, please contact our office at [phone] option 4 or send [place] a message through [place].  ? ?We are unable to tell what your co-pay for medications will be in advance as this is different depending on your insurance coverage. However, we may be able to find a substitute medication at lower cost or fill out paperwork to get insurance to cover a needed medication.  ? ?If a prior authorization is required to get your medication covered by your insurance company, please allow [place] 1-2 business days to complete this process. ? ?Drug prices often vary depending on where the prescription is filled and some pharmacies may offer cheaper prices. ? ?The website www.goodrx.com contains coupons for medications through different pharmacies. The prices here do not account for what the cost may be with help from insurance (it may be cheaper with your insurance), but the website can give you the price if you did not use any insurance.  ?- You can print the associated coupon and take it with your prescription to the pharmacy.  ?- You may also stop by our office during regular business hours and pick up a GoodRx coupon card.  ?- If you need your prescription sent electronically to a different pharmacy, notify our office through [facility] or by phone at [phone] option 4. ? ? ? ? ?Si Usted Necesita Algo Despu?s de Su Visita ? ?Tambi?n puede enviarnos un mensaje a trav?s de MyChart. Por lo general respondemos a los mensajes de MyChart en el transcurso de 1 a 2 d?as h?biles. ? ?Para renovar recetas, por favor pida a su farmacia que se ponga en contacto con nuestra oficina. Nuestro n?mero de fax es el [phone]. ? ?Si tiene un asunto urgente cuando la cl?nica est? cerrada y que no puede esperar  hasta el siguiente d?a h?bil, puede llamar/localizar a su doctor(a) al n?mero que aparece a continuaci?n.  ? ?Por favor, tenga en cuenta que aunque hacemos todo lo posible para estar disponibles para asuntos urgentes fuera del horario de oficina, no estamos disponibles las 24 horas del d?a, los 7 d?as de la semana.  ? ?Si tiene un problema urgente y no puede comunicarse con nosotros, puede optar por buscar atenci?n m?dica  en el consultorio de su doctor(a), en una cl?nica privada, en un centro de atenci?n urgente o en una sala de emergencias. ? ?Si tiene Engineer, maintenance (IT) m?dica, por favor llame inmediatamente al 911 o vaya a la sala de emergencias. ? ?N?meros de b?per ? ?- Dr. Nehemiah Massed: [phone] ? ?- Dra. Moye: [phone] ? ?- Dra. Nicole Kindred: [phone] ? ?En caso de inclemencias del tiempo, por favor llame a nuestra l?nea principal al [phone] para una actualizaci?n [employer]  de cualquier retraso o cierre. ? ?Consejos para la medicaci?n en dermatolog?a: ?Por favor, guarde las cajas en las que vienen los medicamentos de uso t?pico para ayudarle a seguir las instrucciones sobre d?nde y c?mo usarlos. Las farmacias generalmente imprimen las instrucciones del medicamento s?lo en las cajas y no directamente en los tubos del [place].  ? ?Si su medicamento es muy caro, por favor, p?ngase en contacto con Zigmund Daniel llamando al [phone] y presione la opci?n 4 o env?enos un mensaje a trav?s de MyChart.  ? ?No podemos decirle cu?l ser? su copago por los medicamentos por adelantado ya que esto es diferente dependiendo de la cobertura de su seguro. Sin embargo, es posible que podamos encontrar un medicamento sustituto a Electrical engineer un formulario para que el seguro cubra el medicamento que se considera necesario.  ? ?Si se requiere Ardelia Mems autorizaci?n previa para que su compa??a de seguros [place] su medicamento, por favor perm?tanos de 1 a 2 d?as h?biles para completar este proceso. ? ?Los precios  de los medicamentos var?an con frecuencia dependiendo del Environmental consultant de d?nde se surte la receta y alguna farmacias pueden ofrecer precios m?s baratos. ? ?El sitio web www.goodrx.com tiene cupones para medica

## 2021-06-02 NOTE — Progress Notes (Signed)
? ?Follow-Up Visit ?  ?Subjective  ?Elizabeth Morgan is a 71 y.o. female who presents for the following: Annual Exam (Skin cancer screening. Full body. Hx of SCC's. Hx of AK's. Concerned with areas on face). Has itchy spot on lower back. ? ?The patient presents for Total-Body Skin Exam (TBSE) for skin cancer screening and mole check.  The patient has spots, moles and lesions to be evaluated, some may be new or changing and the patient has concerns that these could be cancer. ? ? ?The following portions of the chart were reviewed this encounter and updated as appropriate:   ?  ? ?Review of Systems: No other skin or systemic complaints except as noted in HPI or Assessment and Plan. ? ? ?Objective  ?Well appearing patient in no apparent distress; mood and affect are within normal limits. ? ?A full examination was performed including scalp, head, eyes, ears, nose, lips, neck, chest, axillae, abdomen, back, buttocks, bilateral upper extremities, bilateral lower extremities, hands, feet, fingers, toes, fingernails, and toenails. All findings within normal limits unless otherwise noted below. ? ?spinal lower back x1 ?Erythematous keratotic or waxy stuck-on papule ? ?Left Inferior Chin, mid back left of mid line ?~1.0 cm subcutaneous nodules ? ?right forehead x3, left mid cheek x1 (4) ?Erythematous thin papules/macules with gritty scale.  ? ? ?Assessment & Plan  ? ?History of Squamous Cell Carcinoma in Situ of the Skin. Left lateral zygoma and upper mid forehead. 11/2020. ?- No evidence of recurrence today ?- Recommend regular full body skin exams ?- Recommend daily broad spectrum sunscreen SPF 30+ to sun-exposed areas, reapply every 2 hours as needed.  ?- Call if any new or changing lesions are noted between office visits  ? ?Lentigines ?- Scattered tan macules ?- Due to sun exposure ?- Benign-appearing, observe ?- Recommend daily broad spectrum sunscreen SPF 30+ to sun-exposed areas, reapply every 2 hours as needed. ?-  Call for any changes ? ?Seborrheic Keratoses. Torso, cheeks ?- Stuck-on, waxy, tan-brown papules and/or plaques  ?- Benign-appearing ?- Discussed benign etiology and prognosis. ?- Observe ?- Call for any changes ? ?Melanocytic Nevi ?- Tan-brown and/or pink-flesh-colored symmetric macules and papules ?- Benign appearing on exam today ?- Observation ?- Call clinic for new or changing moles ?- Recommend daily use of broad spectrum spf 30+ sunscreen to sun-exposed areas.  ? ?Hemangiomas. Back, abdomen. ?- Red papules ?- Discussed benign nature ?- Observe ?- Call for any changes ? ?Actinic Damage ?- Chronic condition, secondary to cumulative UV/sun exposure ?- diffuse scaly erythematous macules with underlying dyspigmentation ?- Recommend daily broad spectrum sunscreen SPF 30+ to sun-exposed areas, reapply every 2 hours as needed.  ?- Staying in the shade or wearing long sleeves, sun glasses (UVA+UVB protection) and wide brim hats (4-inch brim around the entire circumference of the hat) are also recommended for sun protection.  ?- Call for new or changing lesions. ? ?Skin cancer screening performed today. ? ?Inflamed seborrheic keratosis ?spinal lower back x1 ? ?Destruction of lesion - spinal lower back x1 ? ?Destruction method: cryotherapy   ?Informed consent: discussed and consent obtained   ?Lesion destroyed using liquid nitrogen: Yes   ?Region frozen until ice ball extended beyond lesion: Yes   ?Outcome: patient tolerated procedure well with no complications   ?Post-procedure details: wound care instructions given   ?Additional details:  Prior to procedure, discussed risks of blister formation, small wound, skin dyspigmentation, or rare scar following cryotherapy. Recommend Vaseline ointment to treated areas while healing.  ? ?  Epidermal inclusion cyst ?Left Inferior Chin, mid back left of mid line ? ?Benign-appearing. Exam most consistent with an epidermal inclusion cyst. Discussed that a cyst is a benign growth that  can grow over time and sometimes get irritated or inflamed. Recommend observation if it is not bothersome. Discussed option of surgical excision to remove it if it is growing, symptomatic, or other changes noted. Please call for new or changing lesions so they can be evaluated. ? ? ? ?AK (actinic keratosis) (4) ?right forehead x3, left mid cheek x1 ? ?Actinic keratoses are precancerous spots that appear secondary to cumulative UV radiation exposure/sun exposure over time. They are chronic with expected duration over 1 year. A portion of actinic keratoses will progress to squamous cell carcinoma of the skin. It is not possible to reliably predict which spots will progress to skin cancer and so treatment is recommended to prevent development of skin cancer. ? ?Recommend daily broad spectrum sunscreen SPF 30+ to sun-exposed areas, reapply every 2 hours as needed.  ?Recommend staying in the shade or wearing long sleeves, sun glasses (UVA+UVB protection) and wide brim hats (4-inch brim around the entire circumference of the hat). ?Call for new or changing lesions. ? ?Destruction of lesion - right forehead x3, left mid cheek x1 ? ?Destruction method: cryotherapy   ?Informed consent: discussed and consent obtained   ?Lesion destroyed using liquid nitrogen: Yes   ?Region frozen until ice ball extended beyond lesion: Yes   ?Outcome: patient tolerated procedure well with no complications   ?Post-procedure details: wound care instructions given   ?Additional details:  Prior to procedure, discussed risks of blister formation, small wound, skin dyspigmentation, or rare scar following cryotherapy. Recommend Vaseline ointment to treated areas while healing.  ? ? ?Varicose Veins/Spider Veins ?- Dilated blue, purple or red veins at the lower extremities ?- Reassured ?- Smaller vessels can be treated by sclerotherapy (a procedure to inject a medicine into the veins to make them disappear) if desired, but the treatment is not covered  by insurance. Larger vessels may be covered if symptomatic and we would refer to vascular surgeon if treatment desired. ? ? ?Return in about 6 months (around 12/03/2021) for TBSE. ? ?I, Emelia Salisbury, CMA, am acting as scribe for Brendolyn Patty, MD. ? ?Documentation: I have reviewed the above documentation for accuracy and completeness, and I agree with the above. ? ?Brendolyn Patty MD  ?

## 2021-06-16 ENCOUNTER — Ambulatory Visit
Admission: RE | Admit: 2021-06-16 | Discharge: 2021-06-16 | Disposition: A | Discharge status: Home / Self Care | Payer: Medicare Other | Source: Ambulatory Visit | Attending: Oncology | Admitting: Oncology

## 2021-06-16 ENCOUNTER — Ambulatory Visit
Admission: RE | Admit: 2021-06-16 | Discharge: 2021-06-16 | Disposition: A | Discharge status: Home / Self Care | Payer: Medicare Other | Source: Ambulatory Visit | Attending: General Surgery | Admitting: General Surgery

## 2021-06-16 DIAGNOSIS — Z923 Personal history of irradiation: Secondary | ICD-10-CM | POA: Insufficient documentation

## 2021-06-16 DIAGNOSIS — Z780 Asymptomatic menopausal state: Secondary | ICD-10-CM | POA: Diagnosis not present

## 2021-06-16 DIAGNOSIS — Z13820 Encounter for screening for osteoporosis: Secondary | ICD-10-CM | POA: Diagnosis not present

## 2021-06-16 DIAGNOSIS — R928 Other abnormal and inconclusive findings on diagnostic imaging of breast: Secondary | ICD-10-CM | POA: Diagnosis not present

## 2021-06-16 DIAGNOSIS — M8589 Other specified disorders of bone density and structure, multiple sites: Secondary | ICD-10-CM | POA: Insufficient documentation

## 2021-06-16 DIAGNOSIS — Z803 Family history of malignant neoplasm of breast: Secondary | ICD-10-CM | POA: Insufficient documentation

## 2021-06-16 DIAGNOSIS — Z5181 Encounter for therapeutic drug level monitoring: Secondary | ICD-10-CM

## 2021-06-16 DIAGNOSIS — Z79811 Long term (current) use of aromatase inhibitors: Secondary | ICD-10-CM | POA: Insufficient documentation

## 2021-06-16 DIAGNOSIS — Z853 Personal history of malignant neoplasm of breast: Secondary | ICD-10-CM | POA: Insufficient documentation

## 2021-06-16 DIAGNOSIS — C50611 Malignant neoplasm of axillary tail of right female breast: Secondary | ICD-10-CM | POA: Insufficient documentation

## 2021-06-19 ENCOUNTER — Other Ambulatory Visit: Payer: Self-pay | Admitting: Oncology

## 2021-06-23 DIAGNOSIS — C50212 Malignant neoplasm of upper-inner quadrant of left female breast: Secondary | ICD-10-CM | POA: Diagnosis not present

## 2021-06-23 DIAGNOSIS — Z86010 Personal history of colonic polyps: Secondary | ICD-10-CM | POA: Diagnosis not present

## 2021-06-23 DIAGNOSIS — Z170 Estrogen receptor positive status [ER+]: Secondary | ICD-10-CM | POA: Diagnosis not present

## 2021-09-03 DIAGNOSIS — S61451A Open bite of right hand, initial encounter: Secondary | ICD-10-CM | POA: Diagnosis not present

## 2021-09-03 DIAGNOSIS — S51851A Open bite of right forearm, initial encounter: Secondary | ICD-10-CM | POA: Diagnosis not present

## 2021-09-03 DIAGNOSIS — S61419A Laceration without foreign body of unspecified hand, initial encounter: Secondary | ICD-10-CM | POA: Insufficient documentation

## 2021-09-03 DIAGNOSIS — W540XXA Bitten by dog, initial encounter: Secondary | ICD-10-CM | POA: Diagnosis not present

## 2021-09-06 DIAGNOSIS — Z5189 Encounter for other specified aftercare: Secondary | ICD-10-CM | POA: Diagnosis not present

## 2021-09-06 DIAGNOSIS — S61412D Laceration without foreign body of left hand, subsequent encounter: Secondary | ICD-10-CM | POA: Diagnosis not present

## 2021-09-09 ENCOUNTER — Other Ambulatory Visit: Payer: Self-pay | Admitting: *Deleted

## 2021-09-09 MED ORDER — ANASTROZOLE 1 MG PO TABS: 1.0000 mg | ORAL_TABLET | Freq: Every day | ORAL | 0 refills | Status: DC

## 2021-09-14 DIAGNOSIS — L03114 Cellulitis of left upper limb: Secondary | ICD-10-CM | POA: Diagnosis not present

## 2021-09-14 DIAGNOSIS — Z4802 Encounter for removal of sutures: Secondary | ICD-10-CM | POA: Diagnosis not present

## 2021-09-15 DIAGNOSIS — L03114 Cellulitis of left upper limb: Secondary | ICD-10-CM | POA: Diagnosis not present

## 2021-09-15 DIAGNOSIS — L0390 Cellulitis, unspecified: Secondary | ICD-10-CM | POA: Insufficient documentation

## 2021-09-25 ENCOUNTER — Other Ambulatory Visit: Payer: Self-pay

## 2021-09-25 ENCOUNTER — Emergency Department
Admission: EM | Admit: 2021-09-25 | Discharge: 2021-09-25 | Disposition: A | Discharge status: Home / Self Care | Payer: Medicare Other | Attending: Emergency Medicine | Admitting: Emergency Medicine

## 2021-09-25 DIAGNOSIS — S61452A Open bite of left hand, initial encounter: Secondary | ICD-10-CM | POA: Diagnosis not present

## 2021-09-25 DIAGNOSIS — S61412A Laceration without foreign body of left hand, initial encounter: Secondary | ICD-10-CM | POA: Diagnosis not present

## 2021-09-25 DIAGNOSIS — W540XXA Bitten by dog, initial encounter: Secondary | ICD-10-CM | POA: Diagnosis not present

## 2021-09-25 DIAGNOSIS — S6992XA Unspecified injury of left wrist, hand and finger(s), initial encounter: Secondary | ICD-10-CM | POA: Diagnosis present

## 2021-09-25 MED ORDER — AMOXICILLIN-POT CLAVULANATE 875-125 MG PO TABS: 1.0000 | ORAL_TABLET | Freq: Two times a day (BID) | ORAL | 0 refills | Status: DC

## 2021-09-25 NOTE — Discharge Instructions (Signed)
Clean the wound gently daily with soap and water.  The wound will heal from the bottom up.  Dog bite should not be sutured as it creates a tunnel increased infection.  If you begin to lose function of your middle finger please follow-up with orthopedics.  Return emergency department if worsening

## 2021-09-25 NOTE — ED Provider Notes (Signed)
[facility] Provider Note    Event Date/Time   First MD Initiated Contact with Patient 09/25/21 1647     (approximate)   History   Animal Bite   HPI  Elizabeth Morgan is a 71 y.o. female [facility] that her dog bit her left hand yesterday and they told her she needed to come here to have her hand washed out..  Her dog is up-to-date on shots.  Second occurrence that her dog has bit her.  Patient's Tdap is up-to-date.      Physical Exam   Triage Vital Signs: ED Triage Vitals  Enc Vitals Group     BP 09/25/21 1625 (!) 171/82     Pulse Rate 09/25/21 1625 64     Resp 09/25/21 1625 16     Temp 09/25/21 1625 98.3 F (36.8 C)     Temp Source 09/25/21 1625 Oral     SpO2 09/25/21 1625 94 %     Weight 09/25/21 1614 168 lb (76.2 kg)     Height --      Head Circumference --      Peak Flow --      Pain Score 09/25/21 1612 4     Pain Loc --      Pain Edu? --      Excl. in [place]? --     Most recent vital signs: Vitals:   09/25/21 1625  BP: (!) 171/82  Pulse: 64  Resp: 16  Temp: 98.3 F (36.8 C)  SpO2: 94%     General: Awake, no distress.   CV:  Good peripheral perfusion. regular rate and  rhythm Resp:  Normal effort.  Abd:  No distention.   Other:  Left hand with linear LAC across the dorsum, full range of motion, patient able to make a fist and extend fingers without difficulty, no active bleeding, neurovascular intac   ED Results / Procedures / Treatments   Labs (all labs ordered are listed, but only abnormal results are displayed) Labs Reviewed - No data to display   EKG     RADIOLOGY     PROCEDURES:   Procedures   MEDICATIONS ORDERED IN ED: Medications - No data to display   IMPRESSION / MDM / [place] / ED COURSE  I reviewed the triage vital signs and the nursing notes.                              Differential diagnosis includes, but is not limited to, dog  bite, cellulitis, wound infection  Patient's presentation is most consistent with acute, uncomplicated illness.  Since the dog's rabies are up-to-date will not do rabies vaccinations.  Patient is clean the wound daily with some mild soap and water.  Take Augmentin as prescribed.  I did explain to her that we do not suture dog bites if possible.  Explained to her that it creates a tunnel and infection.  Most times it will make the wound worse.  She is to follow-up with her regular doctor as needed.  Return if worsening.      FINAL CLINICAL IMPRESSION(S) / ED DIAGNOSES   Final diagnoses:  Dog bite, initial encounter     Rx / DC Orders   ED Discharge Orders          Ordered    amoxicillin-clavulanate (AUGMENTIN) 875-125 MG tablet  2 times daily  09/25/21 1655             Note:  This document was prepared using Dragon voice recognition software and may include unintentional dictation errors.    Versie Starks, PA-C 09/25/21 1657    Blake Divine, MD 09/25/21 [phone]

## 2021-09-25 NOTE — ED Notes (Signed)
E-signature pad unavailable - Pt verbalized understanding of D/C information - no additional concerns at this time.  

## 2021-09-25 NOTE — ED Triage Notes (Signed)
Pt arrives with c/o dog bite that happened last night. Dog is pts dog and is not worried about rabies. Pt last tetanus was 2021.

## 2021-09-30 ENCOUNTER — Inpatient Hospital Stay: Payer: Medicare Other | Attending: Oncology | Admitting: Oncology

## 2021-09-30 ENCOUNTER — Encounter: Payer: Self-pay | Admitting: Oncology

## 2021-09-30 VITALS — BP 110/60 | HR 71 | Temp 98.1°F | Resp 18 | Ht 63.0 in | Wt 167.0 lb

## 2021-09-30 DIAGNOSIS — Z801 Family history of malignant neoplasm of trachea, bronchus and lung: Secondary | ICD-10-CM | POA: Diagnosis not present

## 2021-09-30 DIAGNOSIS — M85839 Other specified disorders of bone density and structure, unspecified forearm: Secondary | ICD-10-CM | POA: Diagnosis not present

## 2021-09-30 DIAGNOSIS — Z08 Encounter for follow-up examination after completed treatment for malignant neoplasm: Secondary | ICD-10-CM

## 2021-09-30 DIAGNOSIS — Z853 Personal history of malignant neoplasm of breast: Secondary | ICD-10-CM | POA: Diagnosis not present

## 2021-09-30 DIAGNOSIS — Z803 Family history of malignant neoplasm of breast: Secondary | ICD-10-CM | POA: Insufficient documentation

## 2021-09-30 DIAGNOSIS — Z5181 Encounter for therapeutic drug level monitoring: Secondary | ICD-10-CM

## 2021-09-30 DIAGNOSIS — Z923 Personal history of irradiation: Secondary | ICD-10-CM | POA: Diagnosis not present

## 2021-09-30 DIAGNOSIS — Z79811 Long term (current) use of aromatase inhibitors: Secondary | ICD-10-CM | POA: Diagnosis not present

## 2021-09-30 DIAGNOSIS — Z87891 Personal history of nicotine dependence: Secondary | ICD-10-CM | POA: Diagnosis not present

## 2021-10-01 NOTE — Progress Notes (Signed)
Hematology/Oncology Consult note [facility]  Telephone:([phone] [phone]  Patient Care Team: Jerrol Banana., MD as PCP - General (Family Medicine) Bary Castilla Forest Gleason, MD as Consulting Physician (General Surgery) Sindy Guadeloupe, MD as Consulting Physician (Oncology) Noreene Filbert, MD as Referring Physician (Radiation Oncology) Theodore Demark, RN (Inactive) as Oncology Nurse Navigator   Name of the patient: Elizabeth Morgan  [id number]  18-Oct-1950   Date of visit: 10/01/21  Diagnosis- pathological prognosticstage Ia invasive mammary carcinoma of the left breast pT2 pN0 cM0 grade 3 ER/PR positive HER-2/neu negative s/p lumpectomy  Chief complaint/ Reason for visit-routine follow-up of breast cancer on Arimidex  Heme/Onc history: Patient is a 70 year old female who underwent a screening mammogram on 05/21/2019 which showed abnormality in the left breast.  This was followed by diagnostic mammogram and ultrasound which showed a 2.4 x 1.4 x 1.9 cm mass in the left breast.  No lymphadenopathy seen in the left axilla.  Core biopsy showed a 15 mm grade 3 invasive mammary carcinoma with focal mucinous features.  ER/PR and HER-2 status was not available at the time of her visit   Patient is currently doing well for her age she does not have any significant comorbidities.  She is independent of her ADLs and IADLs.Patient sister had breast cancer at the age of 67.   Final pathology showed invasive mammary carcinoma with negative margins 2.4 x 2.8 x 1.5 cm, grade 3 ER/PR positive HER-2 -1 sentinel lymph node negative for malignancy.   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  At her age this does not translate into absolute chemotherapy benefit.  Patient therefore did not require any adjuvant chemotherapy. Patient completed adjuvant radiation therapy and started  Arimidex in September 2021    Interval history-patient is tolerating Arimidex well.  Denies any specific complaints.  Appetite and weight have remained stable.  Denies any breast concerns  ECOG PS- 1 Pain scale- 0   Review of systems- Review of Systems  Constitutional:  Negative for chills, fever, malaise/fatigue and weight loss.  HENT:  Negative for congestion, ear discharge and nosebleeds.   Eyes:  Negative for blurred vision.  Respiratory:  Negative for cough, hemoptysis, sputum production, shortness of breath and wheezing.   Cardiovascular:  Negative for chest pain, palpitations, orthopnea and claudication.  Gastrointestinal:  Negative for abdominal pain, blood in stool, constipation, diarrhea, heartburn, melena, nausea and vomiting.  Genitourinary:  Negative for dysuria, flank pain, frequency, hematuria and urgency.  Musculoskeletal:  Negative for back pain, joint pain and myalgias.  Skin:  Negative for rash.  Neurological:  Negative for dizziness, tingling, focal weakness, seizures, weakness and headaches.  Endo/Heme/Allergies:  Does not bruise/bleed easily.  Psychiatric/Behavioral:  Negative for depression and suicidal ideas. The patient does not have insomnia.       Allergies  Allergen Reactions   Aleve [Naproxen Sodium] Other (See Comments)    BURN STOMACH   Aspirin Other (See Comments)    BURN STOMACH   Chicken Protein     Dizziness   Codeine Nausea And Vomiting   Naproxen Other (See Comments)    Stomach burns      Past Medical History:  Diagnosis Date   Actinic keratosis    Allergy    Breast cancer ([place]) 06/11/2019   DCIS   Cancer ([place])    left breast   Family history  of breast cancer    Family history of lung cancer    Hearing loss    Hernia, abdominal    showed up end of April 2018   History of herpes zoster 11/05/2015   Seasonal allergies    Squamous cell carcinoma in situ (SCCIS) 10/07/2020   left lateral zygoma Moh's 12/01/2020   Squamous cell  carcinoma in situ (SCCIS) 10/07/2020   upper mid forehead, MOHs completed 12/15/20     Past Surgical History:  Procedure Laterality Date   ARTHRODESIS METATARSALPHALANGEAL JOINT (MTPJ) Left 06/03/2016   Procedure: ARTHRODESIS METATARSALPHALANGEAL JOINT (MTPJ);  Surgeon: Albertine Patricia, DPM;  Location: [facility];  Service: Podiatry;  Laterality: Left;   BREAST BIOPSY Left 06/08/2019   U/S guided   BREAST LUMPECTOMY Left 06/29/2019   DCIS   BREAST LUMPECTOMY WITH SENTINEL LYMPH NODE BIOPSY Left 06/29/2019   Procedure: BREAST LUMPECTOMY WITH SENTINEL LYMPH NODE BX;  Surgeon: Robert Bellow, MD;  Location: [facility];  Service: General;  Laterality: Left;   CESAREAN SECTION     COLONOSCOPY     COLONOSCOPY WITH PROPOFOL N/A 01/04/2020   Procedure: COLONOSCOPY WITH PROPOFOL;  Surgeon: Robert Bellow, MD;  Location: [facility];  Service: Endoscopy;  Laterality: N/A;   EYE SURGERY Bilateral    X 2   HAMMER TOE SURGERY Left 06/03/2016   Procedure: HAMMER TOE CORRECTION - 2nd left toe;  Surgeon: Albertine Patricia, DPM;  Location: [facility];  Service: Podiatry;  Laterality: Left;   TUBAL LIGATION     WEIL OSTEOTOMY Left 06/03/2016   Procedure: WEIL OSTEOTOMY  SECOND TOE;  Surgeon: Albertine Patricia, DPM;  Location: [place];  Service: Podiatry;  Laterality: Left;    Social History   Socioeconomic History   Marital status: Married    Spouse name: Not on file   Number of children: 1   Years of education: Not on file   Highest education level: Bachelor's degree (e.g., BA, AB, BS)  Occupational History   Occupation: retired  Tobacco Use   Smoking status: Former    Years: 10.00    Types: Cigarettes    Quit date: 1988    Years since quitting: 35.7   Smokeless tobacco: Never   Tobacco comments:    Quit about 30 years ago  Vaping Use   Vaping Use: Never used  Substance and Sexual Activity   Alcohol use: Yes    Comment: occasionally, 1x/month    Drug use: No   Sexual activity: Never  Other Topics Concern   Not on file  Social History Narrative   Not on file   Social Determinants of Health   Financial Resource Strain: Low Risk  (03/11/2021)   Overall Financial Resource Strain (CARDIA)    Difficulty of Paying Living Expenses: Not hard at all  Food Insecurity: No Food Insecurity (03/11/2021)   Hunger Vital Sign    Worried About Running Out of Food in the Last Year: Never true    [place] in the Last Year: Never true  Transportation Needs: No Transportation Needs (03/11/2021)   PRAPARE - Hydrologist (Medical): No    Lack of Transportation (Non-Medical): No  Physical Activity: Insufficiently Active (03/11/2021)   Exercise Vital Sign    Days of Exercise per Week: 3 days    Minutes of Exercise per Session: 30 min  Stress: No Stress Concern Present (03/11/2021)   [place]  Feeling of Stress : Not at all  Social Connections: Moderately Isolated (03/11/2021)   Social Connection and Isolation Panel [NHANES]    Frequency of Communication with Friends and Family: More than three times a week    Frequency of Social Gatherings with Friends and Family: Once a week    Attends Religious Services: Never    Marine scientist or Organizations: No    Attends Archivist Meetings: Never    Marital Status: Married  Human resources officer Violence: Not At Risk (03/11/2021)   Humiliation, Afraid, Rape, and Kick questionnaire    Fear of Current or Ex-Partner: No    Emotionally Abused: No    Physically Abused: No    Sexually Abused: No    Family History  Problem Relation Age of Onset   Dementia Mother    Dementia Father    Hypertension Father    Heart disease Father    Lung cancer Father    Breast cancer Sister 52   Lung cancer Maternal Aunt    Lung cancer Maternal Uncle      Current Outpatient Medications:     amoxicillin-clavulanate (AUGMENTIN) 875-125 MG tablet, Take 1 tablet by mouth 2 (two) times daily., Disp: 14 tablet, Rfl: 0   anastrozole (ARIMIDEX) 1 MG tablet, Take 1 tablet (1 mg total) by mouth daily., Disp: 90 tablet, Rfl: 0   Calcium Carbonate-Vitamin D 600-400 MG-UNIT tablet, Take 2 tablets by mouth daily., Disp: , Rfl:    cetirizine (ZYRTEC) 10 MG tablet, Take 10 mg by mouth daily as needed (allergies). , Disp: , Rfl:    Cholecalciferol (VITAMIN D3) 1.25 MG (50000 UT) CAPS,  daily, 0 Refill(s), Type: Maintenance, Disp: , Rfl:    fluticasone (FLONASE) 50 MCG/ACT nasal spray, Place 1 spray into both nostrils daily as needed for allergies or rhinitis., Disp: , Rfl:   Physical exam:  Vitals:   09/30/21 1120 09/30/21 1122  BP:  110/60  Pulse:  71  Resp:  18  Temp:  98.1 F (36.7 C)  TempSrc:  Tympanic  SpO2:  97%  Weight: 167 lb (75.8 kg)   Height:  5' 3"  (1.6 m)   Physical Exam Constitutional:      General: She is not in acute distress. Cardiovascular:     Rate and Rhythm: Normal rate and regular rhythm.     Heart sounds: Normal heart sounds.  Pulmonary:     Effort: Pulmonary effort is normal.     Breath sounds: Normal breath sounds.  Abdominal:     General: Bowel sounds are normal.     Palpations: Abdomen is soft.  Skin:    General: Skin is warm and dry.  Neurological:     Mental Status: She is alert and oriented to person, place, and time.    Breast exam was performed in seated and lying down position. Patient is status post left lumpectomy with a well-healed surgical scar. No evidence of any palpable masses. No evidence of axillary adenopathy. No evidence of any palpable masses or lumps in the right breast. No evidence of right axillary adenopathy      Latest Ref Rng & Units 04/15/2021    9:55 AM  CMP  Glucose 70 - 99 mg/dL 91   BUN 8 - 27 mg/dL 12   Creatinine 0.57 - 1.00 mg/dL 0.70   Sodium 134 - 144 mmol/L 135   Potassium 3.5 - 5.2 mmol/L 4.6   Chloride 96 -  106 mmol/L 99   CO2  20 - 29 mmol/L 25   Calcium 8.7 - 10.3 mg/dL 9.5   Total Protein 6.0 - 8.5 g/dL 6.7   Total Bilirubin 0.0 - 1.2 mg/dL 0.4   Alkaline Phos 44 - 121 IU/L 93   AST 0 - 40 IU/L 21   ALT 0 - 32 IU/L 13       Latest Ref Rng & Units 04/15/2021    9:55 AM  CBC  WBC 3.4 - 10.8 x10E3/uL 5.8   Hemoglobin 11.1 - 15.9 g/dL 13.7   Hematocrit 34.0 - 46.6 % 40.8   Platelets 150 - 450 x10E3/uL 245      Assessment and plan- Patient is a 71 y.o. female with history of stage I left breast cancer ER/PR positive HER2 negative status postlumpectomy adjuvant radiation therapy and on Arimidex here for routine follow-up   Clinically patient is doing well with no signs and symptoms of recurrence based on today's exam.  She is tolerating Arimidex well and will continue to take it for at least 5 years ending in 2026.  Her bone density scan from May 2023 did show osteopenia but her 10-year probability for major osteoporotic fracture remains less than 20% and hip fracture remains less than 3%.  Continue to monitor.  I will see her back in 6 months no labs   Visit Diagnosis 1. Encounter for follow-up surveillance of breast cancer   2. Visit for monitoring Arimidex therapy   3. Osteopenia of forearm, unspecified laterality      Dr. Randa Evens, MD, MPH [facility] [phone] 10/01/2021 11:54 AM

## 2021-10-15 ENCOUNTER — Ambulatory Visit
Admission: RE | Admit: 2021-10-15 | Discharge: 2021-10-15 | Disposition: A | Discharge status: Home / Self Care | Payer: Medicare Other | Source: Ambulatory Visit | Attending: Radiation Oncology | Admitting: Radiation Oncology

## 2021-10-15 ENCOUNTER — Encounter: Payer: Self-pay | Admitting: Radiation Oncology

## 2021-10-15 VITALS — BP 132/70 | HR 64 | Temp 99.0°F | Resp 16 | Ht 63.0 in | Wt 167.0 lb

## 2021-10-15 DIAGNOSIS — Z79811 Long term (current) use of aromatase inhibitors: Secondary | ICD-10-CM | POA: Insufficient documentation

## 2021-10-15 DIAGNOSIS — C50919 Malignant neoplasm of unspecified site of unspecified female breast: Secondary | ICD-10-CM

## 2021-10-15 DIAGNOSIS — Z170 Estrogen receptor positive status [ER+]: Secondary | ICD-10-CM | POA: Diagnosis not present

## 2021-10-15 DIAGNOSIS — C50511 Malignant neoplasm of lower-outer quadrant of right female breast: Secondary | ICD-10-CM | POA: Diagnosis not present

## 2021-10-15 DIAGNOSIS — Z923 Personal history of irradiation: Secondary | ICD-10-CM | POA: Diagnosis not present

## 2021-10-15 DIAGNOSIS — C50912 Malignant neoplasm of unspecified site of left female breast: Secondary | ICD-10-CM | POA: Diagnosis not present

## 2021-10-15 NOTE — Progress Notes (Signed)
Radiation Oncology Follow up Note  Name: Elizabeth Morgan   Date:   10/15/2021 MRN:  [id number] DOB: [birth year]    This 71 y.o. female presents to the clinic today for 2-year follow-up status post whole breast radiation to her left breast for stage II ER/PR positive invasive mammary carcinoma.  REFERRING PROVIDER: Jerrol Banana.,*  HPI: Patient is a 71 year old female now out 2 years having completed whole breast radiation to her left breast for stage II ER/PR positive invasive mammary carcinoma.  Seen today in routine follow-up she is doing well.  She specifically denies breast tenderness cough or bone pain..  She had mammograms back in May which I have reviewed were BI-RADS 2 benign.  She is currently on Arimidex tolerating it well without side effect.  COMPLICATIONS OF TREATMENT: none  FOLLOW UP COMPLIANCE: keeps appointments   PHYSICAL EXAM:  BP 132/70 (BP Location: Right Arm, Patient Position: Sitting, Cuff Size: Normal)   Pulse 64   Temp 99 F (37.2 C) (Tympanic)   Resp 16   Ht '5\' 3"'$  (1.6 m) Comment: stated ht  Wt 167 lb (75.8 kg)   BMI 29.58 kg/m  Lungs are clear to A&P cardiac examination essentially unremarkable with regular rate and rhythm. No dominant mass or nodularity is noted in either breast in 2 positions examined. Incision is well-healed. No axillary or supraclavicular adenopathy is appreciated. Cosmetic result is excellent.  Well-developed well-nourished patient in NAD. HEENT reveals PERLA, EOMI, discs not visualized.  Oral cavity is clear. No oral mucosal lesions are identified. Neck is clear without evidence of cervical or supraclavicular adenopathy. Lungs are clear to A&P. Cardiac examination is essentially unremarkable with regular rate and rhythm without murmur rub or thrill. Abdomen is benign with no organomegaly or masses noted. Motor sensory and DTR levels are equal and symmetric in the upper and lower extremities. Cranial nerves II through XII are grossly  intact. Proprioception is intact. No peripheral adenopathy or edema is identified. No motor or sensory levels are noted. Crude visual fields are within normal range.  RADIOLOGY RESULTS: Mammograms reviewed compatible with above-stated findings  PLAN: Present time patient is over 2 years out from whole breast radiation.  She has doing extremely well very low side effect profile.  She continues on Arimidex without side effect.  I have asked to see her back in 1 year for follow-up.  Patient knows to call with any concerns.  I would like to take this opportunity to thank you for allowing me to participate in the care of your patient.Noreene Filbert, MD

## 2021-12-21 ENCOUNTER — Other Ambulatory Visit: Payer: Self-pay | Admitting: *Deleted

## 2021-12-21 MED ORDER — ANASTROZOLE 1 MG PO TABS: 1.0000 mg | ORAL_TABLET | Freq: Every day | ORAL | 1 refills | Status: DC

## 2021-12-23 ENCOUNTER — Ambulatory Visit (INDEPENDENT_AMBULATORY_CARE_PROVIDER_SITE_OTHER): Payer: Medicare Other | Admitting: Dermatology

## 2021-12-23 ENCOUNTER — Encounter: Payer: Self-pay | Admitting: Dermatology

## 2021-12-23 DIAGNOSIS — Z1283 Encounter for screening for malignant neoplasm of skin: Secondary | ICD-10-CM

## 2021-12-23 DIAGNOSIS — L578 Other skin changes due to chronic exposure to nonionizing radiation: Secondary | ICD-10-CM | POA: Diagnosis not present

## 2021-12-23 DIAGNOSIS — L720 Epidermal cyst: Secondary | ICD-10-CM

## 2021-12-23 DIAGNOSIS — Z86007 Personal history of in-situ neoplasm of skin: Secondary | ICD-10-CM | POA: Diagnosis not present

## 2021-12-23 DIAGNOSIS — L821 Other seborrheic keratosis: Secondary | ICD-10-CM

## 2021-12-23 DIAGNOSIS — L570 Actinic keratosis: Secondary | ICD-10-CM | POA: Diagnosis not present

## 2021-12-23 DIAGNOSIS — L814 Other melanin hyperpigmentation: Secondary | ICD-10-CM

## 2021-12-23 DIAGNOSIS — D229 Melanocytic nevi, unspecified: Secondary | ICD-10-CM | POA: Diagnosis not present

## 2021-12-23 DIAGNOSIS — Z872 Personal history of diseases of the skin and subcutaneous tissue: Secondary | ICD-10-CM

## 2021-12-23 NOTE — Progress Notes (Signed)
Follow-Up Visit   Subjective  Elizabeth Morgan is a 71 y.o. female who presents for the following: Annual Exam (Hx SCCis, AK. Patient does have 2 spots, one at each cheek that feel like scabs, present for a few months. ).  The patient presents for Total-Body Skin Exam (TBSE) for skin cancer screening and mole check.  The patient has spots, moles and lesions to be evaluated, some may be new or changing and the patient has concerns that these could be cancer.  The following portions of the chart were reviewed this encounter and updated as appropriate:   Tobacco  Allergies  Meds  Problems  Med Hx  Surg Hx  Fam Hx      Review of Systems:  No other skin or systemic complaints except as noted in HPI or Assessment and Plan.  Objective  Well appearing patient in no apparent distress; mood and affect are within normal limits.  A full examination was performed including scalp, head, eyes, ears, nose, lips, neck, chest, axillae, abdomen, back, buttocks, bilateral upper extremities, bilateral lower extremities, hands, feet, fingers, toes, fingernails, and toenails. All findings within normal limits unless otherwise noted below.  L forehead above brow x 1 Erythematous thin papules/macules with gritty scale.   face Smooth white papule(s).     Assessment & Plan  AK (actinic keratosis) L forehead above brow x 1  Actinic keratoses are precancerous spots that appear secondary to cumulative UV radiation exposure/sun exposure over time. They are chronic with expected duration over 1 year. A portion of actinic keratoses will progress to squamous cell carcinoma of the skin. It is not possible to reliably predict which spots will progress to skin cancer and so treatment is recommended to prevent development of skin cancer.  Recommend daily broad spectrum sunscreen SPF 30+ to sun-exposed areas, reapply every 2 hours as needed.  Recommend staying in the shade or wearing long sleeves, sun glasses  (UVA+UVB protection) and wide brim hats (4-inch brim around the entire circumference of the hat). Call for new or changing lesions.  Prior to procedure, discussed risks of blister formation, small wound, skin dyspigmentation, or rare scar following cryotherapy. Recommend Vaseline ointment to treated areas while healing.   Destruction of lesion - L forehead above brow x 1  Destruction method: cryotherapy   Informed consent: discussed and consent obtained   Lesion destroyed using liquid nitrogen: Yes   Cryotherapy cycles:  2 Outcome: patient tolerated procedure well with no complications   Post-procedure details: wound care instructions given    [place] face  Benign-appearing.  Observation.  Call clinic for new or changing lesions.     History of PreCancerous Actinic Keratosis  - site(s) of PreCancerous Actinic Keratosis clear today. - these may recur and new lesions may form requiring treatment to prevent transformation into skin cancer - observe for new or changing spots and contact [place] for appointment if occur - photoprotection with sun protective clothing; sunglasses and broad spectrum sunscreen with SPF of at least 30 + and frequent self skin exams recommended - yearly exams by a dermatologist recommended for persons with history of PreCancerous Actinic Keratoses  History of Squamous Cell Carcinoma in Situ of the Skin - No evidence of recurrence today - Recommend regular full body skin exams - Recommend daily broad spectrum sunscreen SPF 30+ to sun-exposed areas, reapply every 2 hours as needed.  - Call if any new or changing lesions are noted between office visits  Lentigines - Scattered  tan macules - Due to sun exposure - Benign-appearing, observe - Recommend daily broad spectrum sunscreen SPF 30+ to sun-exposed areas, reapply every 2 hours as needed. - Call for any changes  Seborrheic Keratoses - Stuck-on, waxy, tan-brown papules and/or plaques  -  Benign-appearing - Discussed benign etiology and prognosis. - Observe - Call for any changes  Melanocytic Nevi - Tan-brown and/or pink-flesh-colored symmetric macules and papules - Benign appearing on exam today - Observation - Call clinic for new or changing moles - Recommend daily use of broad spectrum spf 30+ sunscreen to sun-exposed areas.   Hemangiomas - Red papules - Discussed benign nature - Observe - Call for any changes  Actinic Damage - Chronic condition, secondary to cumulative UV/sun exposure - diffuse scaly erythematous macules with underlying dyspigmentation - Recommend daily broad spectrum sunscreen SPF 30+ to sun-exposed areas, reapply every 2 hours as needed.  - Staying in the shade or wearing long sleeves, sun glasses (UVA+UVB protection) and wide brim hats (4-inch brim around the entire circumference of the hat) are also recommended for sun protection.  - Call for new or changing lesions.  Skin cancer screening performed today.  Return in about 6 months (around 06/24/2022) for TBSE, Hx SCCis, Hx AK.  Graciella Belton, RMA, am acting as scribe for Forest Gleason, MD .   Documentation: I have reviewed the above documentation for accuracy and completeness, and I agree with the above.  Forest Gleason, MD

## 2021-12-23 NOTE — Patient Instructions (Addendum)
Cryotherapy Aftercare  Wash gently with soap and water everyday.   Apply Vaseline and Band-Aid daily until healed.   Recommend taking Heliocare sun protection supplement daily in sunny weather for additional sun protection. For maximum protection on the sunniest days, you can take up to 2 capsules of regular Heliocare OR take 1 capsule of Heliocare Ultra. For prolonged exposure (such as a full day in the sun), you can repeat your dose of the supplement 4 hours after your first dose. Heliocare can be purchased at [facility], at some [facility] or at [email or web address].    Melanoma ABCDEs  Melanoma is the most dangerous type of skin cancer, and is the leading cause of death from skin disease.  You are more likely to develop melanoma if you: Have light-colored skin, light-colored eyes, or red or blond hair Spend a lot of time in the sun Tan regularly, either outdoors or in a tanning bed Have had blistering sunburns, especially during childhood Have a close family member who has had a melanoma Have atypical moles or large birthmarks  Early detection of melanoma is key since treatment is typically straightforward and cure rates are extremely high if we catch it early.   The first sign of melanoma is often a change in a mole or a new dark spot.  The ABCDE system is a way of remembering the signs of melanoma.  A for asymmetry:  The two halves do not match. B for border:  The edges of the growth are irregular. C for color:  A mixture of colors are present instead of an even brown color. D for diameter:  Melanomas are usually (but not always) greater than 6mm - the size of a pencil eraser. E for evolution:  The spot keeps changing in size, shape, and color.  Please check your skin once per month between visits. You can use a small mirror in front and a large mirror behind you to keep an eye on the back side or your body.   If you see any new or changing lesions before your next follow-up,  please call to schedule a visit.  Please continue daily skin protection including broad spectrum sunscreen SPF 30+ to sun-exposed areas, reapplying every 2 hours as needed when you're outdoors.    Due to recent changes in healthcare laws, you may see results of your pathology and/or laboratory studies on MyChart before the doctors have had a chance to review them. We understand that in some cases there may be results that are confusing or concerning to you. Please understand that not all results are received at the same time and often the doctors may need to interpret multiple results in order to provide you with the best plan of care or course of treatment. Therefore, we ask that you please give us 2 business days to thoroughly review all your results before contacting the office for clarification. Should we see a critical lab result, you will be contacted sooner.   If You Need Anything After Your Visit  If you have any questions or concerns for your doctor, please call our main line at [phone] and press option 4 to reach your doctor's medical assistant. If no one answers, please leave a voicemail as directed and we will return your call as soon as possible. Messages left after 4 pm will be answered the following business day.   You may also send us a message via MyChart. We typically respond to MyChart messages within 1-2 business days.    For prescription refills, please ask your pharmacy to contact our office. Our fax number is [phone].  If you have an urgent issue when the clinic is closed that cannot wait until the next business day, you can page your doctor at the number below.    Please note that while we do our best to be available for urgent issues outside of office hours, we are not available 24/7.   If you have an urgent issue and are unable to reach us, you may choose to seek medical care at your doctor's office, retail clinic, urgent care center, or emergency room.  If you  have a medical emergency, please immediately call 911 or go to the emergency department.  Pager Numbers  - Dr. Kowalski: [phone]  - Dr. Moye: [phone]  - Dr. Stewart: [phone]  In the event of inclement weather, please call our main line at [phone] for an update on the status of any delays or closures.  Dermatology Medication Tips: Please keep the boxes that topical medications come in in order to help keep track of the instructions about where and how to use these. Pharmacies typically print the medication instructions only on the boxes and not directly on the medication tubes.   If your medication is too expensive, please contact our office at [phone] option 4 or send us a message through MyChart.   We are unable to tell what your co-pay for medications will be in advance as this is different depending on your insurance coverage. However, we may be able to find a substitute medication at lower cost or fill out paperwork to get insurance to cover a needed medication.   If a prior authorization is required to get your medication covered by your insurance company, please allow us 1-2 business days to complete this process.  Drug prices often vary depending on where the prescription is filled and some pharmacies may offer cheaper prices.  The website www.goodrx.com contains coupons for medications through different pharmacies. The prices here do not account for what the cost may be with help from insurance (it may be cheaper with your insurance), but the website can give you the price if you did not use any insurance.  - You can print the associated coupon and take it with your prescription to the pharmacy.  - You may also stop by our office during regular business hours and pick up a GoodRx coupon card.  - If you need your prescription sent electronically to a different pharmacy, notify our office through [facility] MyChart or by phone at [phone] option  4.     Si Usted Necesita Algo Despus de Su Visita  Tambin puede enviarnos un mensaje a travs de MyChart. Por lo general respondemos a los mensajes de MyChart en el transcurso de 1 a 2 das hbiles.  Para renovar recetas, por favor pida a su farmacia que se ponga en contacto con nuestra oficina. Nuestro nmero de fax es el [phone].  Si tiene un asunto urgente cuando la clnica est cerrada y que no puede esperar hasta el siguiente da hbil, puede llamar/localizar a su doctor(a) al nmero que aparece a continuacin.   Por favor, tenga en cuenta que aunque hacemos todo lo posible para estar disponibles para asuntos urgentes fuera del horario de oficina, no estamos disponibles las 24 horas del da, los 7 das de la semana.   Si tiene un problema urgente y no puede comunicarse con nosotros, puede optar por buscar atencin mdica  en   el consultorio de su doctor(a), en una clnica privada, en un centro de atencin urgente o en una sala de emergencias.  Si tiene una emergencia mdica, por favor llame inmediatamente al 911 o vaya a la sala de emergencias.  Nmeros de bper  - Dr. Kowalski: [phone]  - Dra. Moye: [phone]  - Dra. Stewart: [phone]  En caso de inclemencias del tiempo, por favor llame a nuestra lnea principal al [phone] para una actualizacin sobre el estado de cualquier retraso o cierre.  Consejos para la medicacin en dermatologa: Por favor, guarde las cajas en las que vienen los medicamentos de uso tpico para ayudarle a seguir las instrucciones sobre dnde y cmo usarlos. Las farmacias generalmente imprimen las instrucciones del medicamento slo en las cajas y no directamente en los tubos del medicamento.   Si su medicamento es muy caro, por favor, pngase en contacto con nuestra oficina llamando al [phone] y presione la opcin 4 o envenos un mensaje a travs de MyChart.   No podemos decirle cul ser su copago por los medicamentos por  adelantado ya que esto es diferente dependiendo de la cobertura de su seguro. Sin embargo, es posible que podamos encontrar un medicamento sustituto a menor costo o llenar un formulario para que el seguro cubra el medicamento que se considera necesario.   Si se requiere una autorizacin previa para que su compaa de seguros cubra su medicamento, por favor permtanos de 1 a 2 das hbiles para completar este proceso.  Los precios de los medicamentos varan con frecuencia dependiendo del lugar de dnde se surte la receta y alguna farmacias pueden ofrecer precios ms baratos.  El sitio web www.goodrx.com tiene cupones para medicamentos de diferentes farmacias. Los precios aqu no tienen en cuenta lo que podra costar con la ayuda del seguro (puede ser ms barato con su seguro), pero el sitio web puede darle el precio si no utiliz ningn seguro.  - Puede imprimir el cupn correspondiente y llevarlo con su receta a la farmacia.  - Tambin puede pasar por nuestra oficina durante el horario de atencin regular y recoger una tarjeta de cupones de GoodRx.  - Si necesita que su receta se enve electrnicamente a una farmacia diferente, informe a nuestra oficina a travs de MyChart de [facility] o por telfono llamando al [phone] y presione la opcin 4.  

## 2022-01-28 DIAGNOSIS — H2513 Age-related nuclear cataract, bilateral: Secondary | ICD-10-CM | POA: Diagnosis not present

## 2022-03-15 ENCOUNTER — Ambulatory Visit (INDEPENDENT_AMBULATORY_CARE_PROVIDER_SITE_OTHER): Payer: Medicare Other

## 2022-03-15 VITALS — Ht 63.0 in | Wt 167.0 lb

## 2022-03-15 DIAGNOSIS — Z0000 Encounter for general adult medical examination without abnormal findings: Secondary | ICD-10-CM

## 2022-03-15 DIAGNOSIS — Z1231 Encounter for screening mammogram for malignant neoplasm of breast: Secondary | ICD-10-CM

## 2022-03-15 NOTE — Patient Instructions (Signed)
Elizabeth Morgan , Thank you for taking time to come for your Medicare Wellness Visit. I appreciate your ongoing commitment to your health goals. Please review the following plan we discussed and let me know if I can assist you in the future.   These are the goals we discussed:  Goals      DIET - EAT MORE FRUITS AND VEGETABLES     DIET - INCREASE WATER INTAKE     Recommend to drink at least 6-8 8oz glasses of water per day.     Prevent falls     Recommend to continue efforts to reduce smoking habits until no longer smoking.        This is a list of the screening recommended for you and due dates:  Health Maintenance  Topic Date Due   Zoster (Shingles) Vaccine (1 of 2) Never done   Pneumonia Vaccine (1 of 1 - PCV) Never done   Flu Shot  Never done   COVID-19 Vaccine (4 - 2023-24 season) 09/18/2021   Medicare Annual Wellness Visit  03/16/2023   Mammogram  06/17/2023   Colon Cancer Screening  01/03/2025   DTaP/Tdap/Td vaccine (4 - Td or Tdap) 04/21/2029   DEXA scan (bone density measurement)  Completed   Hepatitis C Screening: USPSTF Recommendation to screen - Ages 85-79 yo.  Completed   HPV Vaccine  Aged Out    Advanced directives: yes  Conditions/risks identified: none  Next appointment: Follow up in one year for your annual wellness visit 03/21/2023@ 0945 am telephone   Preventive Care 65 Years and Older, Female Preventive care refers to lifestyle choices and visits with your health care provider that can promote health and wellness. What does preventive care include? A yearly physical exam. This is also called an annual well check. Dental exams once or twice a year. Routine eye exams. Ask your health care provider how often you should have your eyes checked. Personal lifestyle choices, including: Daily care of your teeth and gums. Regular physical activity. Eating a healthy diet. Avoiding tobacco and drug use. Limiting alcohol use. Practicing safe sex. Taking low-dose  aspirin every day. Taking vitamin and mineral supplements as recommended by your health care provider. What happens during an annual well check? The services and screenings done by your health care provider during your annual well check will depend on your age, overall health, lifestyle risk factors, and family history of disease. Counseling  Your health care provider may ask you questions about your: Alcohol use. Tobacco use. Drug use. Emotional well-being. Home and relationship well-being. Sexual activity. Eating habits. History of falls. Memory and ability to understand (cognition). Work and work Statistician. Reproductive health. Screening  You may have the following tests or measurements: Height, weight, and BMI. Blood pressure. Lipid and cholesterol levels. These may be checked every 5 years, or more frequently if you are over 67 years old. Skin check. Lung cancer screening. You may have this screening every year starting at age 76 if you have a 30-pack-year history of smoking and currently smoke or have quit within the past 15 years. Fecal occult blood test (FOBT) of the stool. You may have this test every year starting at age 25. Flexible sigmoidoscopy or colonoscopy. You may have a sigmoidoscopy every 5 years or a colonoscopy every 10 years starting at age 63. Hepatitis C blood test. Hepatitis B blood test. Sexually transmitted disease (STD) testing. Diabetes screening. This is done by checking your blood sugar (glucose) after you have not  eaten for a while (fasting). You may have this done every 1-3 years. Bone density scan. This is done to screen for osteoporosis. You may have this done starting at age 26. Mammogram. This may be done every 1-2 years. Talk to your health care provider about how often you should have regular mammograms. Talk with your health care provider about your test results, treatment options, and if necessary, the need for more tests. Vaccines  Your  health care provider may recommend certain vaccines, such as: Influenza vaccine. This is recommended every year. Tetanus, diphtheria, and acellular pertussis (Tdap, Td) vaccine. You may need a Td booster every 10 years. Zoster vaccine. You may need this after age 17. Pneumococcal 13-valent conjugate (PCV13) vaccine. One dose is recommended after age 64. Pneumococcal polysaccharide (PPSV23) vaccine. One dose is recommended after age 68. Talk to your health care provider about which screenings and vaccines you need and how often you need them. This information is not intended to replace advice given to you by your health care provider. Make sure you discuss any questions you have with your health care provider. Document Released: 01/31/2015 Document Revised: 09/24/2015 Document Reviewed: 11/05/2014 Elsevier Interactive Patient Education  2017 [place] Prevention in the Home Falls can cause injuries. They can happen to people of all ages. There are many things you can do to make your home safe and to help prevent falls. What can I do on the outside of my home? Regularly fix the edges of walkways and driveways and fix any cracks. Remove anything that might make you trip as you walk through a door, such as a raised step or threshold. Trim any bushes or trees on the path to your home. Use bright outdoor lighting. Clear any walking paths of anything that might make someone trip, such as rocks or tools. Regularly check to see if handrails are loose or broken. Make sure that both sides of any steps have handrails. Any raised decks and porches should have guardrails on the edges. Have any leaves, snow, or ice cleared regularly. Use sand or salt on walking paths during winter. Clean up any spills in your garage right away. This includes oil or grease spills. What can I do in the bathroom? Use night lights. Install grab bars by the toilet and in the tub and shower. Do not use towel bars as  grab bars. Use non-skid mats or decals in the tub or shower. If you need to sit down in the shower, use a plastic, non-slip stool. Keep the floor dry. Clean up any water that spills on the floor as soon as it happens. Remove soap buildup in the tub or shower regularly. Attach bath mats securely with double-sided non-slip rug tape. Do not have throw rugs and other things on the floor that can make you trip. What can I do in the bedroom? Use night lights. Make sure that you have a light by your bed that is easy to reach. Do not use any sheets or blankets that are too big for your bed. They should not hang down onto the floor. Have a firm chair that has side arms. You can use this for support while you get dressed. Do not have throw rugs and other things on the floor that can make you trip. What can I do in the kitchen? Clean up any spills right away. Avoid walking on wet floors. Keep items that you use a lot in easy-to-reach places. If you need to reach  something above you, use a strong step stool that has a grab bar. Keep electrical cords out of the way. Do not use floor polish or wax that makes floors slippery. If you must use wax, use non-skid floor wax. Do not have throw rugs and other things on the floor that can make you trip. What can I do with my stairs? Do not leave any items on the stairs. Make sure that there are handrails on both sides of the stairs and use them. Fix handrails that are broken or loose. Make sure that handrails are as long as the stairways. Check any carpeting to make sure that it is firmly attached to the stairs. Fix any carpet that is loose or worn. Avoid having throw rugs at the top or bottom of the stairs. If you do have throw rugs, attach them to the floor with carpet tape. Make sure that you have a light switch at the top of the stairs and the bottom of the stairs. If you do not have them, ask someone to add them for you. What else can I do to help prevent  falls? Wear shoes that: Do not have high heels. Have rubber bottoms. Are comfortable and fit you well. Are closed at the toe. Do not wear sandals. If you use a stepladder: Make sure that it is fully opened. Do not climb a closed stepladder. Make sure that both sides of the stepladder are locked into place. Ask someone to hold it for you, if possible. Clearly mark and make sure that you can see: Any grab bars or handrails. First and last steps. Where the edge of each step is. Use tools that help you move around (mobility aids) if they are needed. These include: Canes. Walkers. Scooters. Crutches. Turn on the lights when you go into a dark area. Replace any light bulbs as soon as they burn out. Set up your furniture so you have a clear path. Avoid moving your furniture around. If any of your floors are uneven, fix them. If there are any pets around you, be aware of where they are. Review your medicines with your doctor. Some medicines can make you feel dizzy. This can increase your chance of falling. Ask your doctor what other things that you can do to help prevent falls. This information is not intended to replace advice given to you by your health care provider. Make sure you discuss any questions you have with your health care provider. Document Released: 10/31/2008 Document Revised: 06/12/2015 Document Reviewed: 02/08/2014 Elsevier Interactive Patient Education  2017 [employer].

## 2022-03-15 NOTE — Progress Notes (Signed)
I connected with  [name] on 03/15/22 by a audio telephone and verified that I am speaking with the correct person using two identifiers.  Patient Location: Home  Provider Location: Office/Clinic  I discussed the limitations of evaluation and management by telemedicine. The patient expressed understanding and agreed to proceed.  Subjective:   SHARIANN MALINSKI is a 72 y.o. female who presents for Medicare Annual (Subsequent) preventive examination.  Review of Systems         Objective:    Today's Vitals   03/15/22 0949  Weight: 167 lb (75.8 kg)  Height: '5\' 3"'$  (1.6 m)   Body mass index is 29.58 kg/m.     03/15/2022   10:10 AM 10/15/2021    9:55 AM 09/30/2021   11:17 AM 09/25/2021    4:13 PM 03/25/2021   11:05 AM 03/11/2021    9:45 AM 10/15/2020    9:26 AM  Advanced Directives  Does Patient Have a Medical Advance Directive? Yes No No No No No No  Would patient like information on creating a medical advance directive?  No - Patient declined No - Patient declined No - Patient declined No - Patient declined No - Patient declined No - Patient declined    Current Medications (verified) Outpatient Encounter Medications as of 03/15/2022  Medication Sig   anastrozole (ARIMIDEX) 1 MG tablet Take 1 tablet (1 mg total) by mouth daily.   Calcium Carbonate-Vitamin D 600-400 MG-UNIT tablet Take 2 tablets by mouth daily.   cetirizine (ZYRTEC) 10 MG tablet Take 10 mg by mouth daily as needed (allergies).    fluticasone (FLONASE) 50 MCG/ACT nasal spray Place 1 spray into both nostrils daily as needed for allergies or rhinitis.   Cholecalciferol (VITAMIN D3) 1.25 MG (50000 UT) CAPS  daily, 0 Refill(s), Type: Maintenance (Patient not taking: Reported on 03/15/2022)   No facility-administered encounter medications on file as of 03/15/2022.    Allergies (verified) Aleve [naproxen sodium], Aspirin, Chicken protein, Codeine, and Naproxen   History: Past Medical History:  Diagnosis  Date   Actinic keratosis    Allergy    Breast cancer ([place]) 06/11/2019   DCIS   Cancer ([place])    left breast   Family history of breast cancer    Family history of lung cancer    Hearing loss    Hernia, abdominal    showed up end of April 2018   History of herpes zoster 11/05/2015   Seasonal allergies    Squamous cell carcinoma in situ (SCCIS) 10/07/2020   left lateral zygoma Moh's 12/01/2020   Squamous cell carcinoma in situ (SCCIS) 10/07/2020   upper mid forehead, MOHs completed 12/15/20   Past Surgical History:  Procedure Laterality Date   ARTHRODESIS METATARSALPHALANGEAL JOINT (MTPJ) Left 06/03/2016   Procedure: ARTHRODESIS METATARSALPHALANGEAL JOINT (MTPJ);  Surgeon: Albertine Patricia, DPM;  Location: [facility];  Service: Podiatry;  Laterality: Left;   BREAST BIOPSY Left 06/08/2019   U/S guided   BREAST LUMPECTOMY Left 06/29/2019   DCIS   BREAST LUMPECTOMY WITH SENTINEL LYMPH NODE BIOPSY Left 06/29/2019   Procedure: BREAST LUMPECTOMY WITH SENTINEL LYMPH NODE BX;  Surgeon: Robert Bellow, MD;  Location: [facility];  Service: General;  Laterality: Left;   CESAREAN SECTION     COLONOSCOPY     COLONOSCOPY WITH PROPOFOL N/A 01/04/2020   Procedure: COLONOSCOPY WITH PROPOFOL;  Surgeon: Robert Bellow, MD;  Location: [facility];  Service: Endoscopy;  Laterality: N/A;   EYE SURGERY Bilateral  X 2   HAMMER TOE SURGERY Left 06/03/2016   Procedure: HAMMER TOE CORRECTION - 2nd left toe;  Surgeon: Albertine Patricia, DPM;  Location: [facility];  Service: Podiatry;  Laterality: Left;   TUBAL LIGATION     WEIL OSTEOTOMY Left 06/03/2016   Procedure: WEIL OSTEOTOMY  SECOND TOE;  Surgeon: Albertine Patricia, DPM;  Location: [facility];  Service: Podiatry;  Laterality: Left;   Family History  Problem Relation Age of Onset   Dementia Mother    Dementia Father    Hypertension Father    Heart disease Father    Lung cancer Father    Breast cancer Sister 39    Lung cancer Maternal Aunt    Lung cancer Maternal Uncle    Social History   Socioeconomic History   Marital status: Married    Spouse name: Not on file   Number of children: 1   Years of education: Not on file   Highest education level: Bachelor's degree (e.g., BA, AB, BS)  Occupational History   Occupation: retired  Tobacco Use   Smoking status: Former    Years: 10.00    Types: Cigarettes    Quit date: 1988    Years since quitting: 36.1   Smokeless tobacco: Never   Tobacco comments:    Quit about 30 years ago  Vaping Use   Vaping Use: Never used  Substance and Sexual Activity   Alcohol use: Yes    Comment: occasionally, 1x/month   Drug use: No   Sexual activity: Never  Other Topics Concern   Not on file  Social History Narrative   Not on file   Social Determinants of Health   Financial Resource Strain: Low Risk  (03/15/2022)   Overall Financial Resource Strain (CARDIA)    Difficulty of Paying Living Expenses: Not hard at all  Food Insecurity: No Food Insecurity (03/15/2022)   Hunger Vital Sign    Worried About Running Out of Food in the Last Year: Never true    [place] in the Last Year: Never true  Transportation Needs: No Transportation Needs (03/15/2022)   PRAPARE - Hydrologist (Medical): No    Lack of Transportation (Non-Medical): No  Physical Activity: Inactive (03/15/2022)   Exercise Vital Sign    Days of Exercise per Week: 0 days    Minutes of Exercise per Session: 0 min  Stress: No Stress Concern Present (03/15/2022)   [place]    Feeling of Stress : Not at all  Social Connections: Moderately Integrated (03/15/2022)   Social Connection and Isolation Panel [NHANES]    Frequency of Communication with Friends and Family: More than three times a week    Frequency of Social Gatherings with Friends and Family: More than three times a week    Attends Religious  Services: Never    Marine scientist or Organizations: Yes    Attends Archivist Meetings: Never    Marital Status: Married    Tobacco Counseling Counseling given: Not Answered Tobacco comments: Quit about 30 years ago   Clinical Intake:  Pre-visit preparation completed: Yes  Pain : No/denies pain     BMI - recorded: 29.58 Nutritional Status: BMI 25 -29 Overweight Nutritional Risks: None Diabetes: No  How often do you need to have someone help you when you read instructions, pamphlets, or other written materials from your doctor or pharmacy?: 1 - Never  Diabetic?no  Interpreter Needed?: No  Information entered by :: B.Soraida Vickers,LPN   Activities of Daily Living    03/15/2022   10:06 AM 04/02/2021    9:23 AM  In your present state of health, do you have any difficulty performing the following activities:  Hearing? 1 0  Vision? 0 0  Difficulty concentrating or making decisions? 0 0  Walking or climbing stairs? 0 0  Dressing or bathing? 0 0  Doing errands, shopping? 0 0  Preparing Food and eating ? N   Using the Toilet? N   In the past six months, have you accidently leaked urine? Y   Do you have problems with loss of bowel control? N   Managing your Medications? N   Managing your Finances? N   Housekeeping or managing your Housekeeping? N     Patient Care Team: [name], PA-C as PCP - General (Physician Assistant) Bary Castilla Forest Gleason, MD as Consulting Physician (General Surgery) Sindy Guadeloupe, MD as Consulting Physician (Oncology) Noreene Filbert, MD as Referring Physician (Radiation Oncology) Theodore Demark, RN (Inactive) as Oncology Nurse Navigator  Indicate any recent Medical Services you may have received from other than [facility] providers in the past year (date may be approximate).     Assessment:   This is a routine wellness examination for Brylei.  Hearing/Vision screen Hearing Screening - Comments:: Inadequate hearing-needs  testing Vision Screening - Comments:: Adequate vision Dr Edison Pace  Dietary issues and exercise activities discussed: Current Exercise Habits: The patient does not participate in regular exercise at present, Exercise limited by: None identified   Goals Addressed             This Visit's Progress    DIET - EAT MORE FRUITS AND VEGETABLES   On track    DIET - INCREASE WATER INTAKE   Not on track    Recommend to drink at least 6-8 8oz glasses of water per day.     Prevent falls   On track    Recommend to continue efforts to reduce smoking habits until no longer smoking.       Depression Screen    03/15/2022    9:58 AM 04/02/2021    9:23 AM 03/11/2021    9:44 AM 03/05/2020   10:06 AM 02/28/2019   10:00 AM 02/07/2018    9:39 AM 02/03/2017    8:38 AM  PHQ 2/9 Scores  PHQ - 2 Score 0 1 0 0 0 0 0  PHQ- 9 Score  2    0 0    Fall Risk    03/15/2022    9:54 AM 04/02/2021    9:23 AM 03/11/2021    9:45 AM 03/05/2020   10:09 AM 02/28/2019   10:03 AM  Fall Risk   Falls in the past year? 0 0 0 1 0  Number falls in past yr: 0 0 0 0 0  Injury with Fall? 0 0 0 0 0  Risk for fall due to : No Fall Risks No Fall Risks No Fall Risks No Fall Risks   Follow up Education provided;Falls prevention discussed Falls evaluation completed Falls evaluation completed Falls prevention discussed     FALL RISK PREVENTION PERTAINING TO THE HOME:  Any stairs in or around the home? Yes  If so, are there any without handrails? Yes  Home free of loose throw rugs in walkways, pet beds, electrical cords, etc? Yes  Adequate lighting in your home to reduce risk of falls? Yes  ASSISTIVE DEVICES UTILIZED TO PREVENT FALLS:  Life alert? No  Use of a cane, walker or w/c? No  Grab bars in the bathroom? No  Shower chair or bench in shower? No  Elevated toilet seat or a handicapped toilet? No    Cognitive Function:        03/15/2022   10:07 AM  6CIT Screen  What Year? 0 points  What month? 0 points  What  time? 0 points  Count back from 20 0 points  Months in reverse 0 points  Repeat phrase 0 points  Total Score 0 points    Immunizations Immunization History  Administered Date(s) Administered   PFIZER(Purple Top)SARS-COV-2 Vaccination 03/13/2019, 04/03/2019, 11/13/2019   Td 05/27/2003, 04/22/2019   Tdap 02/03/2017    TDAP status: Up to date  Flu Vaccine status: Declined, Education has been provided regarding the importance of this vaccine but patient still declined. Advised may receive this vaccine at local pharmacy or Health Dept. Aware to provide a copy of the vaccination record if obtained from local pharmacy or Health Dept. Verbalized acceptance and understanding.  Pneumococcal vaccine status: Declined,  Education has been provided regarding the importance of this vaccine but patient still declined. Advised may receive this vaccine at local pharmacy or Health Dept. Aware to provide a copy of the vaccination record if obtained from local pharmacy or Health Dept. Verbalized acceptance and understanding.   Covid-19 vaccine status: Declined, Education has been provided regarding the importance of this vaccine but patient still declined. Advised may receive this vaccine at local pharmacy or Health Dept.or vaccine clinic. Aware to provide a copy of the vaccination record if obtained from local pharmacy or Health Dept. Verbalized acceptance and understanding.  Qualifies for Shingles Vaccine? Yes   Zostavax completed No   Shingrix Completed?: No.    Education has been provided regarding the importance of this vaccine. Patient has been advised to call insurance company to determine out of pocket expense if they have not yet received this vaccine. Advised may also receive vaccine at local pharmacy or Health Dept. Verbalized acceptance and understanding.  Screening Tests Health Maintenance  Topic Date Due   Zoster Vaccines- Shingrix (1 of 2) Never done   Pneumonia Vaccine 10+ Years old (1 of 1  - PCV) Never done   INFLUENZA VACCINE  Never done   COVID-19 Vaccine (4 - 2023-24 season) 09/18/2021   Medicare Annual Wellness (AWV)  03/16/2023   MAMMOGRAM  06/17/2023   COLONOSCOPY (Pts 45-20yr Insurance coverage will need to be confirmed)  01/03/2025   DTaP/Tdap/Td (4 - Td or Tdap) 04/21/2029   DEXA SCAN  Completed   Hepatitis C Screening  Completed   HPV VACCINES  Aged Out    Health Maintenance  Health Maintenance Due  Topic Date Due   Zoster Vaccines- Shingrix (1 of 2) Never done   Pneumonia Vaccine 70 Years old (1 of 1 - PCV) Never done   INFLUENZA VACCINE  Never done   COVID-19 Vaccine (4 - 2023-24 season) 09/18/2021    Colorectal cancer screening: Type of screening: Colonoscopy. Completed yes. Repeat every 7 years  Mammogram status: Ordered yes. Pt provided with contact info and advised to call to schedule appt.   Bone Density status: Completed yes. Results reflect: Bone density results: NORMAL. Repeat every 5 years.  Lung Cancer Screening: (Low Dose CT Chest recommended if Age 70-80years, 30 pack-year currently smoking OR have quit w/in 15years.) does not qualify.   Lung Cancer Screening Referral:  no  Additional Screening:  Hepatitis C Screening: does not qualify; Completed yes  Vision Screening: Recommended annual ophthalmology exams for early detection of glaucoma and other disorders of the eye. Is the patient up to date with their annual eye exam?  Yes  Who is the provider or what is the name of the office in which the patient attends annual eye exams? Dr Edison Pace If pt is not established with a provider, would they like to be referred to a provider to establish care? No .   Dental Screening: Recommended annual dental exams for proper oral hygiene  Community Resource Referral / Chronic Care Management: CRR required this visit?  No   CCM required this visit?  No      Plan:     I have personally reviewed and noted the following in the patient's chart:    Medical and social history Use of alcohol, tobacco or illicit drugs  Current medications and supplements including opioid prescriptions. Patient is not currently taking opioid prescriptions. Functional ability and status Nutritional status Physical activity Advanced directives List of other physicians Hospitalizations, surgeries, and ER visits in previous 12 months Vitals Screenings to include cognitive, depression, and falls Referrals and appointments  In addition, I have reviewed and discussed with patient certain preventive protocols, quality metrics, and best practice recommendations. A written personalized care plan for preventive services as well as general preventive health recommendations were provided to patient.     Roger Shelter, LPN   [id number]   Nurse Notes: pt is doing well and has no concerns at this time

## 2022-03-15 NOTE — Addendum Note (Signed)
Addended by: Roger Shelter on: 03/15/2022 12:39 PM   Modules accepted: Orders

## 2022-04-05 ENCOUNTER — Encounter: Payer: Medicare Other | Admitting: Family Medicine

## 2022-04-05 ENCOUNTER — Ambulatory Visit: Payer: Medicare Other | Admitting: Oncology

## 2022-04-06 ENCOUNTER — Encounter: Payer: Self-pay | Admitting: Physician Assistant

## 2022-04-06 ENCOUNTER — Ambulatory Visit (INDEPENDENT_AMBULATORY_CARE_PROVIDER_SITE_OTHER): Payer: Medicare Other | Admitting: Physician Assistant

## 2022-04-06 VITALS — BP 155/78 | HR 66 | Wt 172.0 lb

## 2022-04-06 DIAGNOSIS — R0989 Other specified symptoms and signs involving the circulatory and respiratory systems: Secondary | ICD-10-CM | POA: Diagnosis not present

## 2022-04-06 DIAGNOSIS — R030 Elevated blood-pressure reading, without diagnosis of hypertension: Secondary | ICD-10-CM

## 2022-04-06 DIAGNOSIS — L989 Disorder of the skin and subcutaneous tissue, unspecified: Secondary | ICD-10-CM | POA: Insufficient documentation

## 2022-04-06 DIAGNOSIS — R1084 Generalized abdominal pain: Secondary | ICD-10-CM | POA: Insufficient documentation

## 2022-04-06 DIAGNOSIS — E7800 Pure hypercholesterolemia, unspecified: Secondary | ICD-10-CM | POA: Diagnosis not present

## 2022-04-06 DIAGNOSIS — I10 Essential (primary) hypertension: Secondary | ICD-10-CM | POA: Diagnosis not present

## 2022-04-06 NOTE — Assessment & Plan Note (Signed)
The 10-year ASCVD risk score (Arnett DK, et al., 2019) is: 22% Not on a statin - no history of intolerance ? Repeat fasting lipids but likely needs statin

## 2022-04-06 NOTE — Assessment & Plan Note (Signed)
Sus for basal cell ca. Encouraged pt to keep her upcoming derm appt

## 2022-04-06 NOTE — Progress Notes (Signed)
I,[name],acting as a Education administrator for [employer], PA-C.,have documented all relevant documentation on the behalf of Elizabeth Kirschner, PA-C,as directed by  Elizabeth Kirschner, PA-C while in the presence of Elizabeth Kirschner, PA-C.   Established patient visit   Patient: Elizabeth Morgan   DOB: November 25, 1950   72 y.o. Female  MRN: [id number] Visit Date: 04/06/2022  Today's healthcare provider: [name], PA-C   Cc. Several concerns, routine bw  Subjective    HPI  Abdominal Pain -Pt reports sometimes after eating her stomach / abdomen gets in 'knots' for a few minutes. Similar to cramping pain. Does not last longer than a few minutes. Denies association with bowel movements. Denies constipation, rectal bleeding. Unsure what foods cause it.   Excessive Throat clearing -Reports this is year round, feels the need to clear her throat and cough up clear mucous. She takes zyrtec and uses flonase.   Spot on chin -For the past few weeks, painful/itchy/bleeds. Reports it grows in size. She has an upcoming appt with Dermatology in April. History of other skin cancers.  Medications: Outpatient Medications Prior to Visit  Medication Sig   anastrozole (ARIMIDEX) 1 MG tablet Take 1 tablet (1 mg total) by mouth daily.   Calcium Carbonate-Vitamin D 600-400 MG-UNIT tablet Take 2 tablets by mouth daily.   cetirizine (ZYRTEC) 10 MG tablet Take 10 mg by mouth daily as needed (allergies).    Cholecalciferol (VITAMIN D3) 1.25 MG (50000 UT) CAPS    fluticasone (FLONASE) 50 MCG/ACT nasal spray Place 1 spray into both nostrils daily as needed for allergies or rhinitis.   No facility-administered medications prior to visit.    Review of Systems  Constitutional:  Negative for fatigue and fever.  Respiratory:  Positive for cough. Negative for shortness of breath.   Cardiovascular:  Negative for chest pain and leg swelling.  Gastrointestinal:  Positive for abdominal pain.  Skin:  Positive for color  change and rash.  Neurological:  Negative for dizziness and headaches.       Objective    BP (!) 155/78 (BP Location: Right Arm, Patient Position: Sitting, Cuff Size: Normal)   Pulse 66   Wt 172 lb (78 kg)   SpO2 98%   BMI 30.47 kg/m    Physical Exam Constitutional:      General: She is awake.     Appearance: She is well-developed.  HENT:     Head: Normocephalic.  Eyes:     Conjunctiva/sclera: Conjunctivae normal.  Cardiovascular:     Rate and Rhythm: Normal rate and regular rhythm.     Heart sounds: Normal heart sounds.  Pulmonary:     Effort: Pulmonary effort is normal.     Breath sounds: Normal breath sounds.  Skin:    General: Skin is warm.     Comments: Left side of chin there is a 4-5 mm raised papule with a scab.   Neurological:     Mental Status: She is alert and oriented to person, place, and time.  Psychiatric:        Attention and Perception: Attention normal.        Mood and Affect: Mood normal.        Speech: Speech normal.        Behavior: Behavior is cooperative.     No results found for any visits on 04/06/22.  Assessment & Plan     Problem List Items Addressed This Visit       Musculoskeletal and Integument  Skin lesion    Sus for basal cell ca. Encouraged pt to keep her upcoming derm appt        Other   Elevated blood pressure reading    Elevated in office. Appears on chart review to have been normal before Encouraged pt to check at home consistently and return to office in 4-6 weeks to compare values       Relevant Orders   Comprehensive Metabolic Panel (CMET)   Pure hypercholesterolemia    The 10-year ASCVD risk score (Arnett DK, et al., 2019) is: 22% Not on a statin - no history of intolerance ? Repeat fasting lipids but likely needs statin      Relevant Orders   Lipid Profile   CBC w/Diff/Platelet   Throat clearing    Advised trial of mucinex. Continue flonase/zyrtec      Generalized abdominal pain - Primary     Cramping pain associated with some foods Suggested may be with higher fiber foods? Since pain resolves quickly, not associated with rectal bleeding or changes in BM, advised low level of concern      Relevant Orders   Comprehensive Metabolic Panel (CMET)   CBC w/Diff/Platelet    Return in about 6 weeks (around 05/18/2022) for hypertension.      I, Elizabeth Kirschner, PA-C have reviewed all documentation for this visit. The documentation on 04/06/22 for the exam, diagnosis, procedures, and orders are all accurate and complete.  Elizabeth Kirschner, PA-C [facility] [address] [place], [place], [address] Office: [phone] Fax: [place]

## 2022-04-06 NOTE — Assessment & Plan Note (Signed)
Elevated in office. Appears on chart review to have been normal before Encouraged pt to check at home consistently and return to office in 4-6 weeks to compare values

## 2022-04-06 NOTE — Assessment & Plan Note (Signed)
Cramping pain associated with some foods Suggested may be with higher fiber foods? Since pain resolves quickly, not associated with rectal bleeding or changes in BM, advised low level of concern

## 2022-04-06 NOTE — Assessment & Plan Note (Signed)
Advised trial of mucinex. Continue flonase/zyrtec

## 2022-04-07 ENCOUNTER — Other Ambulatory Visit: Payer: Self-pay | Admitting: Physician Assistant

## 2022-04-07 DIAGNOSIS — E7800 Pure hypercholesterolemia, unspecified: Secondary | ICD-10-CM

## 2022-04-07 LAB — LIPID PANEL
Chol/HDL Ratio: 3.4 ratio (ref 0.0–4.4)
Cholesterol, Total: 259 mg/dL — ABNORMAL HIGH (ref 100–199)
HDL: 77 mg/dL (ref >39)
LDL Chol Calc (NIH): 167 mg/dL — ABNORMAL HIGH (ref 0–99)
Triglycerides: 89 mg/dL (ref 0–149)
VLDL Cholesterol Cal: 15 mg/dL (ref 5–40)

## 2022-04-07 LAB — CBC WITH DIFFERENTIAL/PLATELET
Basophils Absolute: 0.1 10*3/uL (ref 0.0–0.2)
Basos: 1 %
EOS (ABSOLUTE): 0.2 10*3/uL (ref 0.0–0.4)
Eos: 3 %
Hematocrit: 38.5 % (ref 34.0–46.6)
Hemoglobin: 13.2 g/dL (ref 11.1–15.9)
Immature Grans (Abs): 0 10*3/uL (ref 0.0–0.1)
Immature Granulocytes: 1 %
Lymphocytes Absolute: 1.9 10*3/uL (ref 0.7–3.1)
Lymphs: 28 %
MCH: 30.6 pg (ref 26.6–33.0)
MCHC: 34.3 g/dL (ref 31.5–35.7)
MCV: 89 fL (ref 79–97)
Monocytes Absolute: 0.7 10*3/uL (ref 0.1–0.9)
Monocytes: 10 %
Neutrophils Absolute: 3.7 10*3/uL (ref 1.4–7.0)
Neutrophils: 57 %
Platelets: 229 10*3/uL (ref 150–450)
RBC: 4.32 x10E6/uL (ref 3.77–5.28)
RDW: 12.2 % (ref 11.7–15.4)
WBC: 6.6 10*3/uL (ref 3.4–10.8)

## 2022-04-07 LAB — COMPREHENSIVE METABOLIC PANEL
ALT: 28 IU/L (ref 0–32)
AST: 30 IU/L (ref 0–40)
Albumin/Globulin Ratio: 2 (ref 1.2–2.2)
Albumin: 4.7 g/dL (ref 3.9–4.9)
Alkaline Phosphatase: 92 IU/L (ref 44–121)
BUN/Creatinine Ratio: 21 (ref 12–28)
BUN: 15 mg/dL (ref 8–27)
Bilirubin Total: 0.4 mg/dL (ref 0.0–1.2)
CO2: 23 mmol/L (ref 20–29)
Calcium: 9.8 mg/dL (ref 8.7–10.3)
Chloride: 102 mmol/L (ref 96–106)
Creatinine, Ser: 0.72 mg/dL (ref 0.57–1.00)
Globulin, Total: 2.4 g/dL (ref 1.5–4.5)
Glucose: 91 mg/dL (ref 70–99)
Potassium: 5 mmol/L (ref 3.5–5.2)
Sodium: 140 mmol/L (ref 134–144)
Total Protein: 7.1 g/dL (ref 6.0–8.5)
eGFR: 90 mL/min/{1.73_m2} (ref >59)

## 2022-04-07 MED ORDER — ROSUVASTATIN CALCIUM 10 MG PO TABS: 10.0000 mg | ORAL_TABLET | Freq: Every day | ORAL | 3 refills | Status: DC

## 2022-04-29 ENCOUNTER — Ambulatory Visit: Payer: Medicare Other | Admitting: Dermatology

## 2022-05-03 ENCOUNTER — Encounter: Payer: Self-pay | Admitting: Oncology

## 2022-05-03 ENCOUNTER — Other Ambulatory Visit: Payer: Self-pay | Admitting: *Deleted

## 2022-05-03 ENCOUNTER — Inpatient Hospital Stay: Payer: Medicare Other | Attending: Oncology | Admitting: Oncology

## 2022-05-03 VITALS — BP 143/72 | HR 61 | Temp 98.3°F | Resp 16 | Ht 63.0 in | Wt 166.0 lb

## 2022-05-03 DIAGNOSIS — Z5181 Encounter for therapeutic drug level monitoring: Secondary | ICD-10-CM

## 2022-05-03 DIAGNOSIS — Z79811 Long term (current) use of aromatase inhibitors: Secondary | ICD-10-CM | POA: Diagnosis not present

## 2022-05-03 DIAGNOSIS — Z08 Encounter for follow-up examination after completed treatment for malignant neoplasm: Secondary | ICD-10-CM

## 2022-05-03 DIAGNOSIS — Z801 Family history of malignant neoplasm of trachea, bronchus and lung: Secondary | ICD-10-CM | POA: Diagnosis not present

## 2022-05-03 DIAGNOSIS — M85839 Other specified disorders of bone density and structure, unspecified forearm: Secondary | ICD-10-CM | POA: Diagnosis not present

## 2022-05-03 DIAGNOSIS — Z87891 Personal history of nicotine dependence: Secondary | ICD-10-CM | POA: Diagnosis not present

## 2022-05-03 DIAGNOSIS — C50912 Malignant neoplasm of unspecified site of left female breast: Secondary | ICD-10-CM | POA: Diagnosis not present

## 2022-05-03 DIAGNOSIS — M8580 Other specified disorders of bone density and structure, unspecified site: Secondary | ICD-10-CM | POA: Diagnosis not present

## 2022-05-03 DIAGNOSIS — C50919 Malignant neoplasm of unspecified site of unspecified female breast: Secondary | ICD-10-CM

## 2022-05-03 DIAGNOSIS — Z803 Family history of malignant neoplasm of breast: Secondary | ICD-10-CM | POA: Insufficient documentation

## 2022-05-03 DIAGNOSIS — Z923 Personal history of irradiation: Secondary | ICD-10-CM | POA: Diagnosis not present

## 2022-05-03 DIAGNOSIS — Z1231 Encounter for screening mammogram for malignant neoplasm of breast: Secondary | ICD-10-CM

## 2022-05-03 DIAGNOSIS — Z170 Estrogen receptor positive status [ER+]: Secondary | ICD-10-CM | POA: Insufficient documentation

## 2022-05-03 NOTE — Progress Notes (Signed)
Hematology/Oncology Consult note [facility]  [phone] [phone]  Patient Care Team: Alfredia Ferguson, PA-C as PCP - General (Physician Assistant) Earline Mayotte, MD as Consulting Physician (General Surgery) Creig Hines, MD as Consulting Physician (Oncology) Carmina Miller, MD as Referring Physician (Radiation Oncology) Scarlett Presto, RN (Inactive) as Oncology Nurse Navigator   Name of the patient: Elizabeth Morgan  [id number]  10-21-1950   Date of visit: 05/03/22  Diagnosis- pathological prognosticstage Ia invasive mammary carcinoma of the left breast pT2 pN0 cM0 grade 3 ER/PR positive HER-2/neu negative s/p lumpectomy   Chief complaint/ Reason for visit-routine follow-up of breast cancer on Arimidex  Heme/Onc history: Patient is a 72 year old female who underwent a screening mammogram on 05/21/2019 which showed abnormality in the left breast.  This was followed by diagnostic mammogram and ultrasound which showed a 2.4 x 1.4 x 1.9 cm mass in the left breast.  No lymphadenopathy seen in the left axilla.  Core biopsy showed a 15 mm grade 3 invasive mammary carcinoma with focal mucinous features.  ER/PR and HER-2 status was not available at the time of her visit   Patient is currently doing well for her age she does not have any significant comorbidities.  She is independent of her ADLs and IADLs.Patient sister had breast cancer at the age of 4.   Final pathology showed invasive mammary carcinoma with negative margins 2.4 x 2.8 x 1.5 cm, grade 3 ER/PR positive HER-2 -1 sentinel lymph node negative for malignancy.   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  At her age this does not translate into absolute chemotherapy benefit.  Patient therefore did not require any adjuvant chemotherapy. Patient completed adjuvant radiation therapy and started  Arimidex in September 2021    Interval history- Tolerating Arimidex along with calcium and vitamin D well.  Occasional pain in the lumpectomy site  ECOG PS- 1 Pain scale- 0   Review of systems- Review of Systems  Constitutional:  Negative for chills, fever, malaise/fatigue and weight loss.  HENT:  Negative for congestion, ear discharge and nosebleeds.   Eyes:  Negative for blurred vision.  Respiratory:  Negative for cough, hemoptysis, sputum production, shortness of breath and wheezing.   Cardiovascular:  Negative for chest pain, palpitations, orthopnea and claudication.  Gastrointestinal:  Negative for abdominal pain, blood in stool, constipation, diarrhea, heartburn, melena, nausea and vomiting.  Genitourinary:  Negative for dysuria, flank pain, frequency, hematuria and urgency.  Musculoskeletal:  Negative for back pain, joint pain and myalgias.  Skin:  Negative for rash.  Neurological:  Negative for dizziness, tingling, focal weakness, seizures, weakness and headaches.  Endo/Heme/Allergies:  Does not bruise/bleed easily.  Psychiatric/Behavioral:  Negative for depression and suicidal ideas. The patient does not have insomnia.       Allergies  Allergen Reactions   Aleve [Naproxen Sodium] Other (See Comments)    BURN STOMACH   Aspirin Other (See Comments)    BURN STOMACH   Chicken Protein     Dizziness   Codeine Nausea And Vomiting   Naproxen Other (See Comments)    Stomach burns      Past Medical History:  Diagnosis Date   Actinic keratosis    Allergy    Breast cancer 06/11/2019   DCIS   Cancer    left breast   Family history of breast cancer    Family history  of lung cancer    Hearing loss    Hernia, abdominal    showed up end of April 2018   History of herpes zoster 11/05/2015   Seasonal allergies    Squamous cell carcinoma in situ (SCCIS) 10/07/2020   left lateral zygoma Moh's 12/01/2020   Squamous cell carcinoma in situ (SCCIS) 10/07/2020   upper mid  forehead, MOHs completed 12/15/20     Past Surgical History:  Procedure Laterality Date   ARTHRODESIS METATARSALPHALANGEAL JOINT (MTPJ) Left 06/03/2016   Procedure: ARTHRODESIS METATARSALPHALANGEAL JOINT (MTPJ);  Surgeon: Recardo Evangelist, DPM;  Location: [facility];  Service: Podiatry;  Laterality: Left;   BREAST BIOPSY Left 06/08/2019   U/S guided   BREAST LUMPECTOMY Left 06/29/2019   DCIS   BREAST LUMPECTOMY WITH SENTINEL LYMPH NODE BIOPSY Left 06/29/2019   Procedure: BREAST LUMPECTOMY WITH SENTINEL LYMPH NODE BX;  Surgeon: Earline Mayotte, MD;  Location: [facility];  Service: General;  Laterality: Left;   CESAREAN SECTION     COLONOSCOPY     COLONOSCOPY WITH PROPOFOL N/A 01/04/2020   Procedure: COLONOSCOPY WITH PROPOFOL;  Surgeon: Earline Mayotte, MD;  Location: [facility];  Service: Endoscopy;  Laterality: N/A;   EYE SURGERY Bilateral    X 2   HAMMER TOE SURGERY Left 06/03/2016   Procedure: HAMMER TOE CORRECTION - 2nd left toe;  Surgeon: Recardo Evangelist, DPM;  Location: [facility];  Service: Podiatry;  Laterality: Left;   TUBAL LIGATION     WEIL OSTEOTOMY Left 06/03/2016   Procedure: WEIL OSTEOTOMY  SECOND TOE;  Surgeon: Recardo Evangelist, DPM;  Location: [facility];  Service: Podiatry;  Laterality: Left;    Social History   Socioeconomic History   Marital status: Married    Spouse name: Not on file   Number of children: 1   Years of education: Not on file   Highest education level: Bachelor's degree (e.g., BA, AB, BS)  Occupational History   Occupation: retired  Tobacco Use   Smoking status: Former    Years: 10    Types: Cigarettes    Quit date: 1988    Years since quitting: 36.3   Smokeless tobacco: Never   Tobacco comments:    Quit about 30 years ago  Vaping Use   Vaping Use: Never used  Substance and Sexual Activity   Alcohol use: Yes    Comment: occasionally, 1x/month   Drug use: No   Sexual activity: Never  Other Topics  Concern   Not on file  Social History Narrative   Not on file   Social Determinants of Health   Financial Resource Strain: Low Risk  (03/15/2022)   Overall Financial Resource Strain (CARDIA)    Difficulty of Paying Living Expenses: Not hard at all  Food Insecurity: No Food Insecurity (03/15/2022)   Hunger Vital Sign    Worried About Running Out of Food in the Last Year: Never true    Ran Out of Food in the Last Year: Never true  Transportation Needs: No Transportation Needs (03/15/2022)   PRAPARE - Administrator, Civil Service (Medical): No    Lack of Transportation (Non-Medical): No  Physical Activity: Inactive (03/15/2022)   Exercise Vital Sign    Days of Exercise per Week: 0 days    Minutes of Exercise per Session: 0 min  Stress: No Stress Concern Present (03/15/2022)   [employer] of Occupational Health - Occupational Stress Questionnaire    Feeling of Stress : Not at  all  Social Connections: Moderately Integrated (03/15/2022)   Social Connection and Isolation Panel [NHANES]    Frequency of Communication with Friends and Family: More than three times a week    Frequency of Social Gatherings with Friends and Family: More than three times a week    Attends Religious Services: Never    Database administrator or Organizations: Yes    Attends Banker Meetings: Never    Marital Status: Married  Catering manager Violence: Not At Risk (03/15/2022)   Humiliation, Afraid, Rape, and Kick questionnaire    Fear of Current or Ex-Partner: No    Emotionally Abused: No    Physically Abused: No    Sexually Abused: No    Family History  Problem Relation Age of Onset   Dementia Mother    Dementia Father    Hypertension Father    Heart disease Father    Lung cancer Father    Breast cancer Sister 94   Lung cancer Maternal Aunt    Lung cancer Maternal Uncle      Current Outpatient Medications:    anastrozole (ARIMIDEX) 1 MG tablet, Take 1 tablet (1 mg  total) by mouth daily., Disp: 90 tablet, Rfl: 1   Calcium Carbonate-Vitamin D 600-400 MG-UNIT tablet, Take 2 tablets by mouth daily., Disp: , Rfl:    cetirizine (ZYRTEC) 10 MG tablet, Take 10 mg by mouth daily as needed (allergies). , Disp: , Rfl:    Cholecalciferol (VITAMIN D3) 1.25 MG (50000 UT) CAPS, , Disp: , Rfl:    fluticasone (FLONASE) 50 MCG/ACT nasal spray, Place 1 spray into both nostrils daily as needed for allergies or rhinitis., Disp: , Rfl:    rosuvastatin (CRESTOR) 10 MG tablet, Take 1 tablet (10 mg total) by mouth daily., Disp: 90 tablet, Rfl: 3  Physical exam:  Vitals:   05/03/22 0959  BP: (!) 143/72  Pulse: 61  Resp: 16  Temp: 98.3 F (36.8 C)  TempSrc: Tympanic  SpO2: 100%  Weight: 166 lb (75.3 kg)  Height: 5\' 3"  (1.6 m)   Physical Exam Cardiovascular:     Rate and Rhythm: Normal rate and regular rhythm.     Heart sounds: Normal heart sounds.  Pulmonary:     Effort: Pulmonary effort is normal.     Breath sounds: Normal breath sounds.  Abdominal:     General: Bowel sounds are normal.     Palpations: Abdomen is soft.  Skin:    General: Skin is warm and dry.  Neurological:     Mental Status: She is alert and oriented to person, place, and time.         Latest Ref Rng & Units 04/06/2022    9:19 AM  CMP  Glucose 70 - 99 mg/dL 91   BUN 8 - 27 mg/dL 15   Creatinine 8.25 - 1.00 mg/dL 1.89   Sodium 842 - 103 mmol/L 140   Potassium 3.5 - 5.2 mmol/L 5.0   Chloride 96 - 106 mmol/L 102   CO2 20 - 29 mmol/L 23   Calcium 8.7 - 10.3 mg/dL 9.8   Total Protein 6.0 - 8.5 g/dL 7.1   Total Bilirubin 0.0 - 1.2 mg/dL 0.4   Alkaline Phos 44 - 121 IU/L 92   AST 0 - 40 IU/L 30   ALT 0 - 32 IU/L 28       Latest Ref Rng & Units 04/06/2022    9:19 AM  CBC  WBC 3.4 - 10.8 x10E3/uL  6.6   Hemoglobin 11.1 - 15.9 g/dL 16.1   Hematocrit 09.6 - 46.6 % 38.5   Platelets 150 - 450 x10E3/uL 229     Assessment and plan- Patient is a 72 y.o. female with history of stage I left  breast cancer ER/PR positive HER2 negative status postlumpectomy adjuvant radiation therapy and on Arimidex   Patient is tolerating Arimidex along with calcium and vitamin D well.  Patient had a bone density scan last year which showed osteopenia but her 10-year probability for major osteoporotic fracture was less than 20% and hip fracture was less than 3%.  She therefore does not require any adjuvant bisphosphonates.  Clinically she is doing well with no concerning signs and symptoms of recurrence based on today's exam.  She will be due for a mammogram next month which I will schedule.  I will see her back in 6 months no labs   Visit Diagnosis 1. Visit for monitoring Arimidex therapy   2. Encounter for follow-up surveillance of breast cancer      Dr. Owens Shark, MD, MPH [facility] [phone] 05/03/2022 10:03 AM

## 2022-05-18 ENCOUNTER — Ambulatory Visit: Payer: Medicare Other | Admitting: Physician Assistant

## 2022-05-26 ENCOUNTER — Ambulatory Visit (INDEPENDENT_AMBULATORY_CARE_PROVIDER_SITE_OTHER): Payer: Medicare Other | Admitting: Physician Assistant

## 2022-05-26 ENCOUNTER — Encounter: Payer: Self-pay | Admitting: Physician Assistant

## 2022-05-26 VITALS — BP 122/55 | HR 69 | Temp 97.8°F | Resp 14 | Ht 62.5 in | Wt 167.3 lb

## 2022-05-26 DIAGNOSIS — E7800 Pure hypercholesterolemia, unspecified: Secondary | ICD-10-CM | POA: Diagnosis not present

## 2022-05-26 DIAGNOSIS — R030 Elevated blood-pressure reading, without diagnosis of hypertension: Secondary | ICD-10-CM

## 2022-05-26 NOTE — Progress Notes (Signed)
I,Vanessa  Vital,acting as a Neurosurgeon for [employer], PA-C.,have documented all relevant documentation on the behalf of Alfredia Ferguson, PA-C,as directed by  Alfredia Ferguson, PA-C while in the presence of Alfredia Ferguson, PA-C.    Established patient visit   Patient: Elizabeth Morgan   DOB: [birth year]   72 y.o. Female  MRN: [id number] Visit Date: 05/26/2022  Today's healthcare provider: Alfredia Ferguson, PA-C   Chief Complaint  Patient presents with   Hypertension   Subjective    HPI  Hypertension, follow-up  BP Readings from Last 3 Encounters:  05/26/22 (!) 122/55  05/03/22 (!) 143/72  04/06/22 (!) 155/78   Wt Readings from Last 3 Encounters:  05/26/22 167 lb 4.8 oz (75.9 kg)  05/03/22 166 lb (75.3 kg)  04/06/22 172 lb (78 kg)     She was last seen for hypertension 6 weeks ago.  BP at that visit was 141/69. Management since that visit includes no changes. Symptoms: No chest pain No chest pressure  No palpitations No syncope  No dyspnea No orthopnea  No paroxysmal nocturnal dyspnea No lower extremity edema   Pertinent labs Lab Results  Component Value Date   CHOL 259 (H) 04/06/2022   HDL 77 04/06/2022   LDLCALC 167 (H) 04/06/2022   TRIG 89 04/06/2022   CHOLHDL 3.4 04/06/2022   Lab Results  Component Value Date   NA 140 04/06/2022   K 5.0 04/06/2022   CREATININE 0.72 04/06/2022   EGFR 90 04/06/2022   GLUCOSE 91 04/06/2022   TSH 1.410 04/15/2021     The 10-year ASCVD risk score (Arnett DK, et al., 2019) is: 14.7%  ---------------------------------------------------------------------------------------------------  Medications: Outpatient Medications Prior to Visit  Medication Sig   anastrozole (ARIMIDEX) 1 MG tablet Take 1 tablet (1 mg total) by mouth daily.   Calcium Carbonate-Vitamin D 600-400 MG-UNIT tablet Take 2 tablets by mouth daily.   cetirizine (ZYRTEC) 10 MG tablet Take 10 mg by mouth daily as needed (allergies).    Cholecalciferol (VITAMIN  D3) 1.25 MG (50000 UT) CAPS    fluticasone (FLONASE) 50 MCG/ACT nasal spray Place 1 spray into both nostrils daily as needed for allergies or rhinitis.   rosuvastatin (CRESTOR) 10 MG tablet Take 1 tablet (10 mg total) by mouth daily.   No facility-administered medications prior to visit.    Review of Systems  All other systems reviewed and are negative.    Objective    BP (!) 122/55 (BP Location: Right Arm, Patient Position: Sitting, Cuff Size: Normal)   Pulse 69   Temp 97.8 F (36.6 C) (Oral)   Resp 14   Ht 5' 2.5" (1.588 m)   Wt 167 lb 4.8 oz (75.9 kg)   SpO2 98%   BMI 30.11 kg/m    Physical Exam Vitals reviewed.  Constitutional:      Appearance: She is not ill-appearing.  HENT:     Head: Normocephalic.  Eyes:     Conjunctiva/sclera: Conjunctivae normal.  Cardiovascular:     Rate and Rhythm: Normal rate.  Pulmonary:     Effort: Pulmonary effort is normal. No respiratory distress.  Neurological:     General: No focal deficit present.     Mental Status: She is alert and oriented to person, place, and time.  Psychiatric:        Mood and Affect: Mood normal.        Behavior: Behavior normal.      No results found for any visits on 05/26/22.  Assessment & Plan     Problem List Items Addressed This Visit       Other   Elevated blood pressure reading - Primary    Bp normal range today.       Pure hypercholesterolemia   Relevant Orders   Comprehensive Metabolic Panel (CMET)   Lipid panel     Return in about 6 months (around 11/26/2022) for chronic conditions; ordered HLD labs to get before her appt.      I, Alfredia Ferguson, PA-C have reviewed all documentation for this visit. The documentation on  05/26/22   for the exam, diagnosis, procedures, and orders are all accurate and complete.  Alfredia Ferguson, PA-C [facility] [address] [place], [place], [address] Office: [phone] Fax: [phone]  [facility]

## 2022-05-26 NOTE — Assessment & Plan Note (Signed)
Bp normal range today.

## 2022-06-09 ENCOUNTER — Other Ambulatory Visit: Payer: Self-pay | Admitting: Oncology

## 2022-06-18 ENCOUNTER — Ambulatory Visit
Admission: RE | Admit: 2022-06-18 | Discharge: 2022-06-18 | Disposition: A | Discharge status: Home / Self Care | Payer: Medicare Other | Source: Ambulatory Visit | Attending: Oncology | Admitting: Oncology

## 2022-06-18 DIAGNOSIS — Z1231 Encounter for screening mammogram for malignant neoplasm of breast: Secondary | ICD-10-CM | POA: Insufficient documentation

## 2022-06-18 DIAGNOSIS — Z5181 Encounter for therapeutic drug level monitoring: Secondary | ICD-10-CM | POA: Diagnosis not present

## 2022-06-18 DIAGNOSIS — Z79811 Long term (current) use of aromatase inhibitors: Secondary | ICD-10-CM | POA: Diagnosis not present

## 2022-06-18 DIAGNOSIS — C50919 Malignant neoplasm of unspecified site of unspecified female breast: Secondary | ICD-10-CM | POA: Diagnosis not present

## 2022-06-23 DIAGNOSIS — Z170 Estrogen receptor positive status [ER+]: Secondary | ICD-10-CM | POA: Diagnosis not present

## 2022-06-23 DIAGNOSIS — C50212 Malignant neoplasm of upper-inner quadrant of left female breast: Secondary | ICD-10-CM | POA: Diagnosis not present

## 2022-06-24 ENCOUNTER — Ambulatory Visit: Payer: Medicare Other | Admitting: Dermatology

## 2022-07-19 ENCOUNTER — Telehealth: Payer: Self-pay

## 2022-07-19 NOTE — Telephone Encounter (Signed)
Copied from CRM [id number]. Topic: General - Other >> Jul 19, 2022 11:49 AM Dondra Prader A wrote: Reason for CRM: Pt is wanting Dr. Roxan Hockey to become her new PCP since Alfredia Ferguson is leaving the practice. Please advise.

## 2022-07-20 NOTE — Telephone Encounter (Signed)
Pt scheduled for October with Dr. Roxan Hockey

## 2022-10-13 ENCOUNTER — Ambulatory Visit: Payer: Medicare Other | Admitting: Radiation Oncology

## 2022-10-21 ENCOUNTER — Ambulatory Visit: Payer: Medicare Other | Admitting: Radiation Oncology

## 2022-10-26 ENCOUNTER — Telehealth: Payer: Self-pay | Admitting: Family Medicine

## 2022-10-26 NOTE — Telephone Encounter (Signed)
Patient is requesting to have blood work done to check her cholesterol before her appt with Dr Laural Benes on Tuesday 10/15. Please f/u with patient

## 2022-10-27 ENCOUNTER — Other Ambulatory Visit: Payer: Self-pay | Admitting: Family Medicine

## 2022-10-27 ENCOUNTER — Encounter: Payer: Self-pay | Admitting: Family Medicine

## 2022-10-27 DIAGNOSIS — E7800 Pure hypercholesterolemia, unspecified: Secondary | ICD-10-CM

## 2022-10-27 NOTE — Telephone Encounter (Signed)
Cholesterol panel ordered. Please come fasting.  Please visit the lab for blood work prior to your upcoming appointment. You can visit the lab without an appointment Mon-Fri between 8A-11:30A or 1P-4:30P.

## 2022-11-02 ENCOUNTER — Ambulatory Visit: Payer: Medicare Other | Admitting: Physician Assistant

## 2022-11-02 ENCOUNTER — Inpatient Hospital Stay: Payer: Medicare Other | Admitting: Oncology

## 2022-11-02 ENCOUNTER — Ambulatory Visit: Payer: Medicare Other | Admitting: Family Medicine

## 2022-11-02 DIAGNOSIS — E7800 Pure hypercholesterolemia, unspecified: Secondary | ICD-10-CM | POA: Diagnosis not present

## 2022-11-03 LAB — LIPID PANEL
Chol/HDL Ratio: 2.4 {ratio} (ref 0.0–4.4)
Cholesterol, Total: 172 mg/dL (ref 100–199)
HDL: 71 mg/dL (ref >39)
LDL Chol Calc (NIH): 87 mg/dL (ref 0–99)
Triglycerides: 72 mg/dL (ref 0–149)
VLDL Cholesterol Cal: 14 mg/dL (ref 5–40)

## 2022-11-04 NOTE — Progress Notes (Signed)
Established patient visit   Patient: Elizabeth Morgan   DOB: Jun 09, 1950   72 y.o. Female  MRN: [id number] Visit Date: 11/05/2022  Today's healthcare provider: Ronnald Ramp, MD   Chief Complaint  Patient presents with   Follow-up   Subjective     HPI   Pt wants to discuss stomach irritation, pt states her stomach sometimes feels like it is burning, cramping, knotting up Last edited by Derinda Late A on 11/05/2022  9:06 AM.       Discussed the use of AI scribe software for clinical note transcription with the patient, who gave verbal consent to proceed.  History of Present Illness   The patient presents with complaints of abdominal discomfort, which she initially suspected to be related to a known left inguinal hernia. However, she also reports a burning sensation in the epigastric region, which is not consistently associated with food intake. The patient describes the abdominal discomfort as cramping and knotting in both lower quadrants. The hernia was self-discovered several years ago and has not been significantly bothersome.  The patient has a history of breast cancer and is currently on anastrozole 1mg , calcium and vitamin D, Zyrtec 10mg , and rosuvastatin (Crestor) 10mg . She denies taking the once-weekly vitamin D supplement. The patient's cholesterol levels are within the goal range, and she reports no musculoskeletal discomfort.  The patient reports daily bowel movements and denies any significant constipation, except for one instance when her spouse was hospitalized. The patient's spouse is also currently dealing with abdominal issues, specifically gallbladder infection. The patient has not received any vaccinations for flu or pneumonia but has agreed to receive the pneumonia vaccine.         Past Medical History:  Diagnosis Date   Actinic keratosis    Allergy    Breast cancer (HCC) 06/11/2019   DCIS   Cancer (HCC)    left breast   Family history  of breast cancer    Family history of lung cancer    Hearing loss    Hernia, abdominal    showed up end of April 2018   History of herpes zoster 11/05/2015   Seasonal allergies    Squamous cell carcinoma in situ (SCCIS) 10/07/2020   left lateral zygoma Moh's 12/01/2020   Squamous cell carcinoma in situ (SCCIS) 10/07/2020   upper mid forehead, MOHs completed 12/15/20    Medications: Outpatient Medications Prior to Visit  Medication Sig   anastrozole (ARIMIDEX) 1 MG tablet TAKE 1 TABLET BY MOUTH EVERY DAY   Calcium Carbonate-Vitamin D 600-400 MG-UNIT tablet Take 2 tablets by mouth daily.   cetirizine (ZYRTEC) 10 MG tablet Take 10 mg by mouth daily as needed (allergies).    fluticasone (FLONASE) 50 MCG/ACT nasal spray Place 1 spray into both nostrils daily as needed for allergies or rhinitis.   rosuvastatin (CRESTOR) 10 MG tablet Take 1 tablet (10 mg total) by mouth daily.   [DISCONTINUED] Cholecalciferol (VITAMIN D3) 1.25 MG (50000 UT) CAPS    No facility-administered medications prior to visit.    Review of Systems  Last lipids Lab Results  Component Value Date   CHOL 172 11/02/2022   HDL 71 11/02/2022   LDLCALC 87 11/02/2022   TRIG 72 11/02/2022   CHOLHDL 2.4 11/02/2022        Objective    BP 125/64 (BP Location: Left Arm, Patient Position: Sitting, Cuff Size: Normal)   Pulse 76   Temp 97.6 F (36.4 C) (Oral)   Ht  5' 2.4" (1.585 m)   Wt 164 lb 1.6 oz (74.4 kg)   SpO2 98%   BMI 29.63 kg/m   BP Readings from Last 3 Encounters:  11/05/22 125/64  05/26/22 (!) 122/55  05/03/22 (!) 143/72   Wt Readings from Last 3 Encounters:  11/05/22 164 lb 1.6 oz (74.4 kg)  05/26/22 167 lb 4.8 oz (75.9 kg)  05/03/22 166 lb (75.3 kg)       Physical Exam  Physical Exam   CHEST: Lungs clear to auscultation, no wheezing, rhonchi, or crackles. CARDIOVASCULAR: Normal heart sounds, regular rate and rhythm. ABDOMEN: Abdomen soft, generalized tenderness upon palpation. No  masses palpated, no fluid wave, normal BS in all four quadrants       No results found for any visits on 11/05/22.  Assessment & Plan     Problem List Items Addressed This Visit     Generalized abdominal pain    Abdominal Pain Does not appear to be related to her reported inguinal hernia on the left  Epigastric burning and lower quadrant cramping, unrelated to food intake. Self-identified left inguinal hernia, not currently symptomatic. No fever or vomiting. -Order H. pylori breath test to rule out bacterial infection. -start trial of acid blocker medication if H. pylori test is negative.omeprazole 40mg  daily  -Consider Bentyl for cramping if other measures are ineffective. -Follow-up in 4-6 weeks to reassess abdominal symptoms.      Pure hypercholesterolemia - Primary     Hyperlipidemia, chronic, components within goal range  LDL within goal range at 87 on rosuvastatin 10mg  daily. Chronic  -Continue rosuvastatin 10mg  daily.      Other Visit Diagnoses     Epigastric abdominal pain       Relevant Orders   H. pylori breath test   Immunization due       Need for pneumococcal vaccine       Encounter for immunization       Relevant Orders   Pneumococcal conjugate vaccine 20-valent (Completed)           General Health Maintenance -Administer Prevnar 20 for pneumonia prevention today.         Return in about 5 weeks (around 12/10/2022) for abd pain.         Ronnald Ramp, MD  [facility] [phone] (phone) [phone] (fax)  [facility]

## 2022-11-05 ENCOUNTER — Ambulatory Visit: Payer: Medicare Other | Admitting: Family Medicine

## 2022-11-05 ENCOUNTER — Encounter: Payer: Self-pay | Admitting: Family Medicine

## 2022-11-05 VITALS — BP 125/64 | HR 76 | Temp 97.6°F | Ht 62.4 in | Wt 164.1 lb

## 2022-11-05 DIAGNOSIS — E7800 Pure hypercholesterolemia, unspecified: Secondary | ICD-10-CM | POA: Diagnosis not present

## 2022-11-05 DIAGNOSIS — C50919 Malignant neoplasm of unspecified site of unspecified female breast: Secondary | ICD-10-CM | POA: Diagnosis not present

## 2022-11-05 DIAGNOSIS — Z23 Encounter for immunization: Secondary | ICD-10-CM | POA: Diagnosis not present

## 2022-11-05 DIAGNOSIS — R1013 Epigastric pain: Secondary | ICD-10-CM

## 2022-11-05 DIAGNOSIS — R1084 Generalized abdominal pain: Secondary | ICD-10-CM

## 2022-11-05 MED ORDER — OMEPRAZOLE 40 MG PO CPDR: 40.0000 mg | DELAYED_RELEASE_CAPSULE | Freq: Every day | ORAL | 3 refills | Status: DC

## 2022-11-05 NOTE — Assessment & Plan Note (Signed)
  Hyperlipidemia, chronic, components within goal range  LDL within goal range at 87 on rosuvastatin 10mg  daily. Chronic  -Continue rosuvastatin 10mg  daily.

## 2022-11-05 NOTE — Patient Instructions (Signed)
VISIT SUMMARY:  During your visit, we discussed your abdominal discomfort, which you initially thought was related to a known left inguinal hernia. However, you also reported a burning sensation in the upper middle part of your stomach, which is not consistently associated with eating. You described the abdominal discomfort as cramping and knotting in both lower parts of your abdomen. We also discussed your history of breast cancer and your current medications. You reported daily bowel movements and denied any significant constipation. We also discussed your cholesterol levels, which are within the goal range, and your agreement to receive the pneumonia vaccine.  YOUR PLAN:  -ABDOMINAL PAIN: You have been experiencing a burning sensation and cramping in your stomach. We will order a breath test to check for a bacterial infection. If the test is negative, we may consider a trial of acid blocker medication. If other measures are ineffective, we may consider a medication for cramping. We will reassess your abdominal symptoms in 4-6 weeks.  -HIGH CHOLESTEROL: Your cholesterol levels are within the goal range. Continue taking your rosuvastatin 10mg  daily.  -BREAST CANCER: Your breast cancer is stable. Continue taking your anastrozole 1mg  daily.  -GENERAL HEALTH MAINTENANCE: To prevent pneumonia, we will administer the Prevnar 20 vaccine today.  INSTRUCTIONS:  Please follow the plan as discussed. Continue taking your current medications. We will order a breath test to check for a bacterial infection in your stomach. If the test is negative, we may consider a trial of acid blocker medication. If other measures are ineffective, we may consider a medication for cramping. We will reassess your abdominal symptoms in 4-6 weeks. Continue taking your rosuvastatin 10mg  daily for your cholesterol. Continue taking your anastrozole 1mg  daily for your breast cancer. We will administer the Prevnar 20 vaccine today to  prevent pneumonia.

## 2022-11-05 NOTE — Assessment & Plan Note (Signed)
Abdominal Pain Does not appear to be related to her reported inguinal hernia on the left  Epigastric burning and lower quadrant cramping, unrelated to food intake. Self-identified left inguinal hernia, not currently symptomatic. No fever or vomiting. -Order H. pylori breath test to rule out bacterial infection. -start trial of acid blocker medication if H. pylori test is negative.omeprazole 40mg  daily  -Consider Bentyl for cramping if other measures are ineffective. -Follow-up in 4-6 weeks to reassess abdominal symptoms.

## 2022-11-05 NOTE — Assessment & Plan Note (Signed)
   Breast Cancer Stable on anastrozole 1mg  daily. -Continue anastrozole 1mg  daily. -continue to follow with oncology as scheduled

## 2022-11-08 ENCOUNTER — Ambulatory Visit: Payer: Medicare Other | Admitting: Oncology

## 2022-11-09 ENCOUNTER — Inpatient Hospital Stay: Payer: Medicare Other | Attending: Oncology | Admitting: Oncology

## 2022-11-09 VITALS — BP 112/77 | HR 76 | Temp 98.5°F | Wt 164.5 lb

## 2022-11-09 DIAGNOSIS — Z79811 Long term (current) use of aromatase inhibitors: Secondary | ICD-10-CM | POA: Diagnosis not present

## 2022-11-09 DIAGNOSIS — Z780 Asymptomatic menopausal state: Secondary | ICD-10-CM | POA: Diagnosis not present

## 2022-11-09 DIAGNOSIS — C50912 Malignant neoplasm of unspecified site of left female breast: Secondary | ICD-10-CM | POA: Insufficient documentation

## 2022-11-09 DIAGNOSIS — Z803 Family history of malignant neoplasm of breast: Secondary | ICD-10-CM | POA: Diagnosis not present

## 2022-11-09 DIAGNOSIS — Z79899 Other long term (current) drug therapy: Secondary | ICD-10-CM

## 2022-11-09 DIAGNOSIS — Z1721 Progesterone receptor positive status: Secondary | ICD-10-CM | POA: Diagnosis not present

## 2022-11-09 DIAGNOSIS — Z1231 Encounter for screening mammogram for malignant neoplasm of breast: Secondary | ICD-10-CM | POA: Diagnosis not present

## 2022-11-09 DIAGNOSIS — Z170 Estrogen receptor positive status [ER+]: Secondary | ICD-10-CM | POA: Insufficient documentation

## 2022-11-09 DIAGNOSIS — Z801 Family history of malignant neoplasm of trachea, bronchus and lung: Secondary | ICD-10-CM | POA: Insufficient documentation

## 2022-11-09 DIAGNOSIS — Z87891 Personal history of nicotine dependence: Secondary | ICD-10-CM | POA: Insufficient documentation

## 2022-11-09 DIAGNOSIS — Z923 Personal history of irradiation: Secondary | ICD-10-CM | POA: Insufficient documentation

## 2022-11-09 DIAGNOSIS — C50919 Malignant neoplasm of unspecified site of unspecified female breast: Secondary | ICD-10-CM

## 2022-11-09 DIAGNOSIS — Z1732 Human epidermal growth factor receptor 2 negative status: Secondary | ICD-10-CM | POA: Diagnosis not present

## 2022-11-14 ENCOUNTER — Encounter: Payer: Self-pay | Admitting: Oncology

## 2022-11-14 NOTE — Progress Notes (Signed)
Hematology/Oncology Consult note [facility]  Telephone:([phone] Fax:([phone]  Patient Care Team: Ronnald Ramp, MD as PCP - General (Family Medicine) Lemar Livings Merrily Pew, MD as Consulting Physician (General Surgery) Creig Hines, MD as Consulting Physician (Oncology) Carmina Miller, MD as Referring Physician (Radiation Oncology) Scarlett Presto, RN (Inactive) as Oncology Nurse Navigator   Name of the patient: Elizabeth Morgan  [id number]  February 23, 1950   Date of visit: 11/14/22  Diagnosis- pathological prognosticstage Ia invasive mammary carcinoma of the left breast pT2 pN0 cM0 grade 3 ER/PR positive HER-2/neu negative s/p lumpectomy   Chief complaint/ Reason for visit- routine f/u of breast cancer  Heme/Onc history: Patient is a 72 year old female who underwent a screening mammogram on 05/21/2019 which showed abnormality in the left breast.  This was followed by diagnostic mammogram and ultrasound which showed a 2.4 x 1.4 x 1.9 cm mass in the left breast.  No lymphadenopathy seen in the left axilla.  Core biopsy showed a 15 mm grade 3 invasive mammary carcinoma with focal mucinous features.  ER/PR and HER-2 status was not available at the time of her visit   Patient is currently doing well for her age she does not have any significant comorbidities.  She is independent of her ADLs and IADLs.Patient sister had breast cancer at the age of 61.   Final pathology showed invasive mammary carcinoma with negative margins 2.4 x 2.8 x 1.5 cm, grade 3 ER/PR positive HER-2 -1 sentinel lymph node negative for malignancy.   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  At her age this does not translate into absolute chemotherapy benefit.  Patient therefore did not require any adjuvant chemotherapy. Patient completed adjuvant radiation therapy and started Arimidex in  September 2021  Interval history-patient is doing well overall.  Denies any breast concerns at this time.  Denies any new aches andPains anywhere.  Tolerating Arimidex along with calcium and vitamin D well without any significant side effects  ECOG PS- 1 Pain scale- 0   Review of systems- Review of Systems  Constitutional:  Negative for chills, fever, malaise/fatigue and weight loss.  HENT:  Negative for congestion, ear discharge and nosebleeds.   Eyes:  Negative for blurred vision.  Respiratory:  Negative for cough, hemoptysis, sputum production, shortness of breath and wheezing.   Cardiovascular:  Negative for chest pain, palpitations, orthopnea and claudication.  Gastrointestinal:  Negative for abdominal pain, blood in stool, constipation, diarrhea, heartburn, melena, nausea and vomiting.  Genitourinary:  Negative for dysuria, flank pain, frequency, hematuria and urgency.  Musculoskeletal:  Negative for back pain, joint pain and myalgias.  Skin:  Negative for rash.  Neurological:  Negative for dizziness, tingling, focal weakness, seizures, weakness and headaches.  Endo/Heme/Allergies:  Does not bruise/bleed easily.  Psychiatric/Behavioral:  Negative for depression and suicidal ideas. The patient does not have insomnia.       Allergies  Allergen Reactions   Aleve [Naproxen Sodium] Other (See Comments)    BURN STOMACH   Aspirin Other (See Comments)    BURN STOMACH   Chicken Protein     Dizziness   Codeine Nausea And Vomiting   Naproxen Other (See Comments)    Stomach burns      Past Medical History:  Diagnosis Date   Actinic keratosis    Allergy    Breast cancer (HCC) 06/11/2019   DCIS   Cancer (HCC)  left breast   Family history of breast cancer    Family history of lung cancer    Hearing loss    Hernia, abdominal    showed up end of April 2018   History of herpes zoster 11/05/2015   Seasonal allergies    Squamous cell carcinoma in situ (SCCIS) 10/07/2020    left lateral zygoma Moh's 12/01/2020   Squamous cell carcinoma in situ (SCCIS) 10/07/2020   upper mid forehead, MOHs completed 12/15/20     Past Surgical History:  Procedure Laterality Date   ARTHRODESIS METATARSALPHALANGEAL JOINT (MTPJ) Left 06/03/2016   Procedure: ARTHRODESIS METATARSALPHALANGEAL JOINT (MTPJ);  Surgeon: Recardo Evangelist, DPM;  Location: [facility];  Service: Podiatry;  Laterality: Left;   BREAST BIOPSY Left 06/08/2019   U/S guided   BREAST LUMPECTOMY Left 06/29/2019   DCIS   BREAST LUMPECTOMY WITH SENTINEL LYMPH NODE BIOPSY Left 06/29/2019   Procedure: BREAST LUMPECTOMY WITH SENTINEL LYMPH NODE BX;  Surgeon: Earline Mayotte, MD;  Location: [facility];  Service: General;  Laterality: Left;   CESAREAN SECTION     COLONOSCOPY     COLONOSCOPY WITH PROPOFOL N/A 01/04/2020   Procedure: COLONOSCOPY WITH PROPOFOL;  Surgeon: Earline Mayotte, MD;  Location: [facility];  Service: Endoscopy;  Laterality: N/A;   EYE SURGERY Bilateral    X 2   HAMMER TOE SURGERY Left 06/03/2016   Procedure: HAMMER TOE CORRECTION - 2nd left toe;  Surgeon: Recardo Evangelist, DPM;  Location: [facility];  Service: Podiatry;  Laterality: Left;   TUBAL LIGATION     WEIL OSTEOTOMY Left 06/03/2016   Procedure: WEIL OSTEOTOMY  SECOND TOE;  Surgeon: Recardo Evangelist, DPM;  Location: [facility];  Service: Podiatry;  Laterality: Left;    Social History   Socioeconomic History   Marital status: Married    Spouse name: Not on file   Number of children: 1   Years of education: Not on file   Highest education level: Bachelor's degree (e.g., BA, AB, BS)  Occupational History   Occupation: retired  Tobacco Use   Smoking status: Former    Current packs/day: 0.00    Types: Cigarettes    Start date: 1978    Quit date: 1988    Years since quitting: 36.8   Smokeless tobacco: Never   Tobacco comments:    Quit about 30 years ago  Vaping Use   Vaping status: Never Used   Substance and Sexual Activity   Alcohol use: Yes    Comment: occasionally, 1x/month   Drug use: No   Sexual activity: Never  Other Topics Concern   Not on file  Social History Narrative   Not on file   Social Determinants of Health   Financial Resource Strain: Low Risk  (03/15/2022)   Overall Financial Resource Strain (CARDIA)    Difficulty of Paying Living Expenses: Not hard at all  Food Insecurity: No Food Insecurity (03/15/2022)   Hunger Vital Sign    Worried About Running Out of Food in the Last Year: Never true    Ran Out of Food in the Last Year: Never true  Transportation Needs: No Transportation Needs (03/15/2022)   PRAPARE - Administrator, Civil Service (Medical): No    Lack of Transportation (Non-Medical): No  Physical Activity: Inactive (03/15/2022)   Exercise Vital Sign    Days of Exercise per Week: 0 days    Minutes of Exercise per Session: 0 min  Stress: No Stress Concern Present (  03/15/2022)   [place] [employer] - Occupational Stress Questionnaire    Feeling of Stress : Not at all  Social Connections: Moderately Integrated (03/15/2022)   Social Connection and Isolation Panel [NHANES]    Frequency of Communication with Friends and Family: More than three times a week    Frequency of Social Gatherings with Friends and Family: More than three times a week    Attends Religious Services: Never    Database administrator or Organizations: Yes    Attends Banker Meetings: Never    Marital Status: Married  Catering manager Violence: Not At Risk (03/15/2022)   Humiliation, Afraid, Rape, and Kick questionnaire    Fear of Current or Ex-Partner: No    Emotionally Abused: No    Physically Abused: No    Sexually Abused: No    Family History  Problem Relation Age of Onset   Dementia Mother    Dementia Father    Hypertension Father    Heart disease Father    Lung cancer Father    Breast cancer Sister 60   Lung cancer  Maternal Aunt    Lung cancer Maternal Uncle      Current Outpatient Medications:    anastrozole (ARIMIDEX) 1 MG tablet, TAKE 1 TABLET BY MOUTH EVERY DAY, Disp: 90 tablet, Rfl: 1   Calcium Carbonate-Vitamin D 600-400 MG-UNIT tablet, Take 2 tablets by mouth daily., Disp: , Rfl:    cetirizine (ZYRTEC) 10 MG tablet, Take 10 mg by mouth daily as needed (allergies). , Disp: , Rfl:    fluticasone (FLONASE) 50 MCG/ACT nasal spray, Place 1 spray into both nostrils daily as needed for allergies or rhinitis., Disp: , Rfl:    omeprazole (PRILOSEC) 40 MG capsule, Take 1 capsule (40 mg total) by mouth daily., Disp: 30 capsule, Rfl: 3   rosuvastatin (CRESTOR) 10 MG tablet, Take 1 tablet (10 mg total) by mouth daily., Disp: 90 tablet, Rfl: 3  Physical exam:  Vitals:   11/09/22 1516  BP: 112/77  Pulse: 76  Temp: 98.5 F (36.9 C)  TempSrc: Tympanic  SpO2: 96%  Weight: 164 lb 8 oz (74.6 kg)   Physical Exam Cardiovascular:     Rate and Rhythm: Normal rate and regular rhythm.     Heart sounds: Normal heart sounds.  Pulmonary:     Effort: Pulmonary effort is normal.     Breath sounds: Normal breath sounds.  Abdominal:     General: Bowel sounds are normal.     Palpations: Abdomen is soft.  Skin:    General: Skin is warm and dry.  Neurological:     Mental Status: She is alert and oriented to person, place, and time.    Breast exam was performed in seated and lying down position. Patient is status post left lumpectomy with a well-healed surgical scar. No evidence of any palpable masses. No evidence of axillary adenopathy. No evidence of any palpable masses or lumps in the right breast. No evidence of right axillary adenopathy      Latest Ref Rng & Units 04/06/2022    9:19 AM  CMP  Glucose 70 - 99 mg/dL 91   BUN 8 - 27 mg/dL 15   Creatinine 2.95 - 1.00 mg/dL 6.21   Sodium 308 - 657 mmol/L 140   Potassium 3.5 - 5.2 mmol/L 5.0   Chloride 96 - 106 mmol/L 102   CO2 20 - 29 mmol/L 23   Calcium  8.7 - 10.3 mg/dL 9.8  Total Protein 6.0 - 8.5 g/dL 7.1   Total Bilirubin 0.0 - 1.2 mg/dL 0.4   Alkaline Phos 44 - 121 IU/L 92   AST 0 - 40 IU/L 30   ALT 0 - 32 IU/L 28       Latest Ref Rng & Units 04/06/2022    9:19 AM  CBC  WBC 3.4 - 10.8 x10E3/uL 6.6   Hemoglobin 11.1 - 15.9 g/dL 45.4   Hematocrit 09.8 - 46.6 % 38.5   Platelets 150 - 450 x10E3/uL 229      Assessment and plan- Patient is a 72 y.o. female with history of stage I left breast cancer ER/PR positive HER2 negative status postlumpectomy adjuvant radiation therapy.  She is currently on Arimidex and this is a routine follow-up visit for breast cancer  Clinically patient is doing well on Arimidex without any signs and symptoms of recurrence based on today's exam.  She will be completing 5 years of endocrine therapy in September 2026.Her mammogram from May 2024 was unremarkable.  I will see her back in 6 months no labs.  She would be due for a bone density scan in May as well.   Visit Diagnosis 1. Invasive carcinoma of breast (HCC)   2. Encounter for screening mammogram for malignant neoplasm of breast   3. Post-menopausal   4. High risk medication use      Dr. Owens Shark, MD, MPH [facility] [phone] 11/14/2022 8:37 PM

## 2022-11-15 ENCOUNTER — Encounter: Payer: Self-pay | Admitting: Dermatology

## 2022-11-15 ENCOUNTER — Ambulatory Visit (INDEPENDENT_AMBULATORY_CARE_PROVIDER_SITE_OTHER): Payer: Medicare Other | Admitting: Dermatology

## 2022-11-15 DIAGNOSIS — L570 Actinic keratosis: Secondary | ICD-10-CM

## 2022-11-15 DIAGNOSIS — L821 Other seborrheic keratosis: Secondary | ICD-10-CM

## 2022-11-15 DIAGNOSIS — L578 Other skin changes due to chronic exposure to nonionizing radiation: Secondary | ICD-10-CM | POA: Diagnosis not present

## 2022-11-15 DIAGNOSIS — L814 Other melanin hyperpigmentation: Secondary | ICD-10-CM

## 2022-11-15 DIAGNOSIS — Z1283 Encounter for screening for malignant neoplasm of skin: Secondary | ICD-10-CM

## 2022-11-15 DIAGNOSIS — D1801 Hemangioma of skin and subcutaneous tissue: Secondary | ICD-10-CM | POA: Diagnosis not present

## 2022-11-15 DIAGNOSIS — L309 Dermatitis, unspecified: Secondary | ICD-10-CM | POA: Diagnosis not present

## 2022-11-15 DIAGNOSIS — Z85828 Personal history of other malignant neoplasm of skin: Secondary | ICD-10-CM | POA: Diagnosis not present

## 2022-11-15 DIAGNOSIS — W908XXA Exposure to other nonionizing radiation, initial encounter: Secondary | ICD-10-CM

## 2022-11-15 DIAGNOSIS — I8393 Asymptomatic varicose veins of bilateral lower extremities: Secondary | ICD-10-CM

## 2022-11-15 DIAGNOSIS — D229 Melanocytic nevi, unspecified: Secondary | ICD-10-CM

## 2022-11-15 MED ORDER — TRIAMCINOLONE ACETONIDE 0.1 % EX CREA: TOPICAL_CREAM | CUTANEOUS | 0 refills | Status: DC

## 2022-11-15 NOTE — Patient Instructions (Addendum)
Gentle Skin Care Guide  1. Bathe no more than once a day.  2. Avoid bathing in hot water  3. Use a mild soap like Dove, Vanicream, Cetaphil, CeraVe. Can use Lever 2000 or Cetaphil antibacterial soap  4. Use soap only where you need it. On most days, use it under your arms, between your legs, and on your feet. Let the water rinse other areas unless visibly dirty.  5. When you get out of the bath/shower, use a towel to gently blot your skin dry, don't rub it.  6. While your skin is still a little damp, apply a moisturizing cream such as Vanicream, CeraVe, Cetaphil, Eucerin, Sarna lotion or plain Vaseline Jelly. For hands apply Neutrogena [place] Hand Cream or Excipial Hand Cream.  7. Reapply moisturizer any time you start to itch or feel dry.  8. Sometimes using free and clear laundry detergents can be helpful. Fabric softener sheets should be avoided. Downy Free & Gentle liquid, or any liquid fabric softener that is free of dyes and perfumes, it acceptable to use  9. If your doctor has given you prescription creams you may apply moisturizers over them     Due to recent changes in healthcare laws, you may see results of your pathology and/or laboratory studies on MyChart before the doctors have had a chance to review them. We understand that in some cases there may be results that are confusing or concerning to you. Please understand that not all results are received at the same time and often the doctors may need to interpret multiple results in order to provide you with the best plan of care or course of treatment. Therefore, we ask that you please give [place] 2 business days to thoroughly review all your results before contacting the office for clarification. Should we see a critical lab result, you will be contacted sooner.   If You Need Anything After Your Visit  If you have any questions or concerns for your doctor, please call our main line at [phone] and press option 4 to reach  your doctor's medical assistant. If no one answers, please leave a voicemail as directed and we will return your call as soon as possible. Messages left after 4 pm will be answered the following business day.   You may also send [place] a message via MyChart. We typically respond to MyChart messages within 1-2 business days.  For prescription refills, please ask your pharmacy to contact our office. Our fax number is [phone].  If you have an urgent issue when the clinic is closed that cannot wait until the next business day, you can page your doctor at the number below.    Please note that while we do our best to be available for urgent issues outside of office hours, we are not available 24/7.   If you have an urgent issue and are unable to reach [place], you may choose to seek medical care at your doctor's office, retail clinic, urgent care center, or emergency room.  If you have a medical emergency, please immediately call 911 or go to the emergency department.  Pager Numbers  - Dr. Gwen Pounds: [phone]  - Dr. Roseanne Reno: [phone]  - Dr. Katrinka Blazing: [phone]   In the event of inclement weather, please call our main line at [phone] for an update on the status of any delays or closures.  Dermatology Medication Tips: Please keep the boxes that topical medications come in in order to help keep track of the instructions about  where and how to use these. Pharmacies typically print the medication instructions only on the boxes and not directly on the medication tubes.   If your medication is too expensive, please contact our office at [phone] option 4 or send [place] a message through MyChart.   We are unable to tell what your co-pay for medications will be in advance as this is different depending on your insurance coverage. However, we may be able to find a substitute medication at lower cost or fill out paperwork to get insurance to cover a needed medication.   If a prior authorization  is required to get your medication covered by your insurance company, please allow [place] 1-2 business days to complete this process.  Drug prices often vary depending on where the prescription is filled and some pharmacies may offer cheaper prices.  The website www.goodrx.com contains coupons for medications through different pharmacies. The prices here do not account for what the cost may be with help from insurance (it may be cheaper with your insurance), but the website can give you the price if you did not use any insurance.  - You can print the associated coupon and take it with your prescription to the pharmacy.  - You may also stop by our office during regular business hours and pick up a GoodRx coupon card.  - If you need your prescription sent electronically to a different pharmacy, notify our office through [facility] or by phone at [phone] option 4.     Si Usted Necesita Algo Despus de Su Visita  Tambin puede enviarnos un mensaje a travs de Clinical cytogeneticist. Por lo general respondemos a los mensajes de MyChart en el transcurso de 1 a 2 das hbiles.  Para renovar recetas, por favor pida a su farmacia que se ponga en contacto con nuestra oficina. Annie Sable de fax es [place] [phone].  Si tiene un asunto urgente cuando la clnica est cerrada y que no puede esperar hasta el siguiente da hbil, puede llamar/localizar a su doctor(a) al nmero que aparece a continuacin.   Por favor, tenga en cuenta que aunque hacemos todo lo posible para estar disponibles para asuntos urgentes fuera del horario de [place], no estamos disponibles las 24 horas del da, los 7 [address]  [address] de la [place].   Si tiene un problema urgente y no puede comunicarse con nosotros, puede optar por buscar atencin mdica  en el consultorio de su doctor(a), en una clnica privada, en un centro de atencin urgente o en una sala de emergencias.  Si tiene Engineer, drilling, por favor llame inmediatamente al 911 o  vaya a la sala de emergencias.  Nmeros de bper  - Dr. Gwen Pounds: [phone]  - Dra. Roseanne Reno: [phone]  - Dr. Katrinka Blazing: [phone]   En caso de inclemencias del tiempo, por favor llame a Lacy Duverney principal al [phone] para una actualizacin sobre el [place] de cualquier retraso o cierre.  Consejos para la medicacin en dermatologa: Por favor, guarde las cajas en las que vienen los medicamentos de uso tpico para ayudarle a seguir las instrucciones sobre dnde y cmo usarlos. Las farmacias generalmente imprimen las instrucciones del medicamento slo en las cajas y no directamente en los tubos del [place].   Si su medicamento es muy caro, por favor, pngase en contacto con Rolm Gala llamando al [phone] y presione la opcin 4 o envenos un mensaje a travs de Clinical cytogeneticist.   No podemos decirle cul ser su copago por los medicamentos por adelantado ya que  esto es diferente dependiendo de la cobertura de su seguro. Sin embargo, es posible que podamos encontrar un medicamento sustituto a Audiological scientist un formulario para que el seguro cubra el medicamento que se considera necesario.   Si se requiere una autorizacin previa para que su compaa de seguros [place] su medicamento, por favor permtanos de 1 a 2 das hbiles para completar [address]  Los precios de los medicamentos varan con frecuencia dependiendo del Environmental consultant de dnde se surte la receta y alguna farmacias pueden ofrecer precios ms baratos.  El sitio web www.goodrx.com tiene cupones para medicamentos de Health and safety inspector. Los precios aqu no tienen en cuenta lo que podra costar con la ayuda del seguro (puede ser ms barato con su seguro), pero el sitio web puede darle el precio si no utiliz Tourist information centre manager.  - Puede imprimir el cupn correspondiente y llevarlo con su receta a la farmacia.  - Tambin puede pasar por nuestra oficina durante el horario de atencin regular y Education officer, museum una tarjeta de cupones  de GoodRx.  - Si necesita que su receta se enve electrnicamente a una farmacia diferente, informe a nuestra oficina a travs de MyChart de [place] o por telfono llamando al [phone] y presione la opcin 4.

## 2022-11-15 NOTE — Progress Notes (Signed)
Follow-Up Visit   Subjective  Elizabeth Morgan is a 72 y.o. female who presents for the following: Skin Cancer Screening and Full Body Skin Exam  The patient presents for Total-Body Skin Exam (TBSE) for skin cancer screening and mole check. The patient has spots, moles and lesions to be evaluated, some may be new or changing and the patient may have concern these could be cancer.   The following portions of the chart were reviewed this encounter and updated as appropriate: medications, allergies, medical history  Review of Systems:  No other skin or systemic complaints except as noted in HPI or Assessment and Plan.  Objective  Well appearing patient in no apparent distress; mood and affect are within normal limits.  A full examination was performed including scalp, head, eyes, ears, nose, lips, neck, chest, axillae, abdomen, back, buttocks, bilateral upper extremities, bilateral lower extremities, hands, feet, fingers, toes, fingernails, and toenails. All findings within normal limits unless otherwise noted below.   Relevant physical exam findings are noted in the Assessment and Plan.  R upper eyebrow x 1 Erythematous thin papules/macules with gritty scale.     Assessment & Plan   SKIN CANCER SCREENING PERFORMED TODAY.  ACTINIC DAMAGE - Chronic condition, secondary to cumulative UV/sun exposure - diffuse scaly erythematous macules with underlying dyspigmentation - Recommend daily broad spectrum sunscreen SPF 30+ to sun-exposed areas, reapply every 2 hours as needed.  - Staying in the shade or wearing long sleeves, sun glasses (UVA+UVB protection) and wide brim hats (4-inch brim around the entire circumference of the hat) are also recommended for sun protection.  - Call for new or changing lesions.  LENTIGINES, SEBORRHEIC KERATOSES, HEMANGIOMAS - Benign normal skin lesions - Benign-appearing - Call for any changes  MELANOCYTIC NEVI - Tan-brown and/or pink-flesh-colored  symmetric macules and papules - Benign appearing on exam today - Observation - Call clinic for new or changing moles - Recommend daily use of broad spectrum spf 30+ sunscreen to sun-exposed areas.   HISTORY OF SQUAMOUS CELL CARCINOMA OF THE SKIN - No evidence of recurrence today - Recommend regular full body skin exams - Recommend daily broad spectrum sunscreen SPF 30+ to sun-exposed areas, reapply every 2 hours as needed.  - Call if any new or changing lesions are noted between office visits  Varicose Veins/Spider Veins - Dilated blue, purple or red veins at the lower extremities - Reassured - Smaller vessels can be treated by sclerotherapy (a procedure to inject a medicine into the veins to make them disappear) if desired, but the treatment is not covered by insurance. Larger vessels may be covered if symptomatic and we would refer to vascular surgeon if treatment desired.  Dermatitis - nummular dermatitis vs stasis dermatitis   Exam: Pink scaly patch on the L med ankle   Treatment Plan: Start TMC 0.1% cream to aa BID until itchy rash cleared no more than 2-4 weeks.   Topical steroids (such as triamcinolone, fluocinolone, fluocinonide, mometasone, clobetasol, halobetasol, betamethasone, hydrocortisone) can cause thinning and lightening of the skin if they are used for too long in the same area. Your physician has selected the right strength medicine for your problem and area affected on the body. Please use your medication only as directed by your physician to prevent side effects.  Recommend mild soap and moisturizing cream 1-2 times daily.  Gentle skin care handout provided.     AK (actinic keratosis) R upper eyebrow x 1  Actinic keratoses are precancerous spots that appear  secondary to cumulative UV radiation exposure/sun exposure over time. They are chronic with expected duration over 1 year. A portion of actinic keratoses will progress to squamous cell carcinoma of the skin. It  is not possible to reliably predict which spots will progress to skin cancer and so treatment is recommended to prevent development of skin cancer.  Recommend daily broad spectrum sunscreen SPF 30+ to sun-exposed areas, reapply every 2 hours as needed.  Recommend staying in the shade or wearing long sleeves, sun glasses (UVA+UVB protection) and wide brim hats (4-inch brim around the entire circumference of the hat). Call for new or changing lesions.   Destruction of lesion - R upper eyebrow x 1  Destruction method: cryotherapy   Informed consent: discussed and consent obtained   Lesion destroyed using liquid nitrogen: Yes   Region frozen until ice ball extended beyond lesion: Yes   Outcome: patient tolerated procedure well with no complications   Post-procedure details: wound care instructions given   Additional details:  Prior to procedure, discussed risks of blister formation, small wound, skin dyspigmentation, or rare scar following cryotherapy. Recommend Vaseline ointment to treated areas while healing.    Return in about 1 year (around 11/15/2023) for TBSE.  Maylene Roes, CMA, am acting as scribe for Willeen Niece, MD .   Documentation: I have reviewed the above documentation for accuracy and completeness, and I agree with the above.  Willeen Niece, MD

## 2022-11-22 ENCOUNTER — Encounter: Payer: Self-pay | Admitting: Radiation Oncology

## 2022-11-22 ENCOUNTER — Ambulatory Visit
Admission: RE | Admit: 2022-11-22 | Discharge: 2022-11-22 | Disposition: A | Discharge status: Home / Self Care | Payer: Medicare Other | Source: Ambulatory Visit | Attending: Radiation Oncology | Admitting: Radiation Oncology

## 2022-11-22 VITALS — BP 123/66 | HR 60 | Temp 97.6°F | Resp 14 | Ht 62.4 in | Wt 163.1 lb

## 2022-11-22 DIAGNOSIS — Z79811 Long term (current) use of aromatase inhibitors: Secondary | ICD-10-CM | POA: Diagnosis not present

## 2022-11-22 DIAGNOSIS — Z923 Personal history of irradiation: Secondary | ICD-10-CM | POA: Diagnosis not present

## 2022-11-22 DIAGNOSIS — C50919 Malignant neoplasm of unspecified site of unspecified female breast: Secondary | ICD-10-CM

## 2022-11-22 DIAGNOSIS — Z170 Estrogen receptor positive status [ER+]: Secondary | ICD-10-CM | POA: Diagnosis not present

## 2022-11-22 DIAGNOSIS — C50912 Malignant neoplasm of unspecified site of left female breast: Secondary | ICD-10-CM | POA: Diagnosis not present

## 2022-11-22 NOTE — Progress Notes (Signed)
Radiation Oncology Follow up Note  Name: Elizabeth Morgan   Date:   11/22/2022 MRN:  [id number] DOB: February 27, 1950    This 72 y.o. female presents to the clinic today for 3-year follow-up status post whole breast radiation to her left breast for stage II ER/PR positive invasive mammary carcinoma.  REFERRING PROVIDER: Bosie Clos, MD  HPI: A 72 year old patient with a history of stage two ERPR positive invasive mammary carcinoma in the left breast presents for a three-year follow-up visit. She underwent whole breast radiation therapy three years ago and has been on Arimidex since then, which she tolerates well. The most recent mammograms, conducted in May, were BI-RADS 2 benign. She reports no current health problems and feels good overall. She has been under the care of Dr. Smith Robert, who she saw in October, and everything was reported as good. She also recently started seeing a new primary care physician, Dr. Roxan Hockey, after her previous doctor left the practice. In addition, she sees a dermatologist, with the most recent visit indicating no issues..  COMPLICATIONS OF TREATMENT: none  FOLLOW UP COMPLIANCE: keeps appointments   PHYSICAL EXAM:  BP 123/66   Pulse 60   Temp 97.6 F (36.4 C)   Resp 14   Ht 5' 2.4" (1.585 m)   Wt 163 lb 1.6 oz (74 kg)   BMI 29.45 kg/m  Lungs are clear to A&P cardiac examination essentially unremarkable with regular rate and rhythm. No dominant mass or nodularity is noted in either breast in 2 positions examined. Incision is well-healed. No axillary or supraclavicular adenopathy is appreciated. Cosmetic result is excellent.  Well-developed well-nourished patient in NAD. HEENT reveals PERLA, EOMI, discs not visualized.  Oral cavity is clear. No oral mucosal lesions are identified. Neck is clear without evidence of cervical or supraclavicular adenopathy. Lungs are clear to A&P. Cardiac examination is essentially unremarkable with regular rate and rhythm without  murmur rub or thrill. Abdomen is benign with no organomegaly or masses noted. Motor sensory and DTR levels are equal and symmetric in the upper and lower extremities. Cranial nerves II through XII are grossly intact. Proprioception is intact. No peripheral adenopathy or edema is identified. No motor or sensory levels are noted. Crude visual fields are within normal range.  RADIOLOGY RESULTS: RADIOLOGY Mammogram: BI-RADS 2 benign (05/2022)  PLAN: Stage II ER/PR positive invasive mammary carcinoma Three years post whole breast radiation. Currently on Arimidex and tolerating well. Last mammogram in May was BI-RADS 2 benign. -Continue Arimidex as prescribed. -Continue regular follow-ups with Dr. Smith Robert. -Discontinue follow-ups with this provider as patient is stable. Available for consultation if needed in the future.    Carmina Miller, MD

## 2022-12-06 ENCOUNTER — Encounter: Payer: Self-pay | Admitting: Dermatology

## 2022-12-06 ENCOUNTER — Ambulatory Visit (INDEPENDENT_AMBULATORY_CARE_PROVIDER_SITE_OTHER): Payer: Medicare Other | Admitting: Dermatology

## 2022-12-06 DIAGNOSIS — L820 Inflamed seborrheic keratosis: Secondary | ICD-10-CM | POA: Diagnosis not present

## 2022-12-06 DIAGNOSIS — L821 Other seborrheic keratosis: Secondary | ICD-10-CM | POA: Diagnosis not present

## 2022-12-06 NOTE — Patient Instructions (Signed)

## 2022-12-06 NOTE — Progress Notes (Signed)
   Follow-Up Visit   Subjective  Elizabeth Morgan is a 72 y.o. female who presents for the following: Lesion on the scalp for about 3 weeks, occasionally itches, but her scalp itches all over anyway, so she isn't sure if itching is related to that.   The following portions of the chart were reviewed this encounter and updated as appropriate: medications, allergies, medical history  Review of Systems:  No other skin or systemic complaints except as noted in HPI or Assessment and Plan.  Objective  Well appearing patient in no apparent distress; mood and affect are within normal limits.   A focused examination was performed of the following areas: the face and scalp   Relevant exam findings are noted in the Assessment and Plan.  L parietal scalp x 1 Erythematous stuck-on, waxy papule or plaque    Assessment & Plan   SEBORRHEIC KERATOSIS - Stuck-on, waxy, tan-brown papules and/or plaques  - Benign-appearing - Discussed benign etiology and prognosis. - Observe - Call for any changes  Inflamed seborrheic keratosis L parietal scalp x 1  Symptomatic, irritating, patient would like treated.   Destruction of lesion - L parietal scalp x 1 Complexity: simple   Destruction method: cryotherapy   Informed consent: discussed and consent obtained   Timeout:  patient name, date of birth, surgical site, and procedure verified Lesion destroyed using liquid nitrogen: Yes   Region frozen until ice ball extended beyond lesion: Yes   Outcome: patient tolerated procedure well with no complications   Post-procedure details: wound care instructions given     Return for appointment as scheduled.  Maylene Roes, CMA, am acting as scribe for Elie Goody, MD .  Documentation: I have reviewed the above documentation for accuracy and completeness, and I agree with the above.  Elie Goody, MD

## 2022-12-08 NOTE — Progress Notes (Unsigned)
      Established patient visit   Patient: Elizabeth Morgan   DOB: [birth year]   72 y.o. Female  MRN: [id number] Visit Date: 12/09/2022  Today's healthcare provider: Ronnald Ramp, MD   No chief complaint on file.  Subjective       Discussed the use of AI scribe software for clinical note transcription with the patient, who gave verbal consent to proceed.  History of Present Illness        Breath test was not collected       Past Medical History:  Diagnosis Date   Actinic keratosis    Allergy    Breast cancer (HCC) 06/11/2019   DCIS   Cancer (HCC)    left breast   Family history of breast cancer    Family history of lung cancer    Hearing loss    Hernia, abdominal    showed up end of April 2018   History of herpes zoster 11/05/2015   Seasonal allergies    Squamous cell carcinoma in situ (SCCIS) 10/07/2020   left lateral zygoma Moh's 12/01/2020   Squamous cell carcinoma in situ (SCCIS) 10/07/2020   upper mid forehead, MOHs completed 12/15/20   Squamous cell carcinoma of skin 10/07/2020   L lat zygoma - SCCIS   Squamous cell carcinoma of skin 10/07/2020   upper mid forehead - SCCIS    Medications: Outpatient Medications Prior to Visit  Medication Sig   anastrozole (ARIMIDEX) 1 MG tablet TAKE 1 TABLET BY MOUTH EVERY DAY   Calcium Carbonate-Vitamin D 600-400 MG-UNIT tablet Take 2 tablets by mouth daily.   cetirizine (ZYRTEC) 10 MG tablet Take 10 mg by mouth daily as needed (allergies).    fluticasone (FLONASE) 50 MCG/ACT nasal spray Place 1 spray into both nostrils daily as needed for allergies or rhinitis.   omeprazole (PRILOSEC) 40 MG capsule Take 1 capsule (40 mg total) by mouth daily.   rosuvastatin (CRESTOR) 10 MG tablet Take 1 tablet (10 mg total) by mouth daily.   triamcinolone cream (KENALOG) 0.1 % Apply to rash on ankle BID up to 2 weeks PRN. Avoid applying to face, groin, and axilla. Use as directed. Long-term use can cause thinning of the  skin.   No facility-administered medications prior to visit.    Review of Systems  {Insert previous labs (optional):23779} {See past labs  Heme  Chem  Endocrine  Serology  Results Review (optional):1}   Objective    There were no vitals taken for this visit. {Insert last BP/Wt (optional):23777}{See vitals history (optional):1}      Physical Exam  ***  No results found for any visits on 12/09/22.  Assessment & Plan     Problem List Items Addressed This Visit   None   Assessment and Plan              No follow-ups on file.         Ronnald Ramp, MD  [facility] [phone] (phone) [phone] (fax)  [facility]

## 2022-12-09 ENCOUNTER — Ambulatory Visit: Payer: Medicare Other | Admitting: Family Medicine

## 2022-12-09 ENCOUNTER — Encounter: Payer: Self-pay | Admitting: Family Medicine

## 2022-12-09 VITALS — BP 122/55 | HR 106 | Resp 16 | Ht 62.4 in | Wt 161.4 lb

## 2022-12-09 DIAGNOSIS — R1084 Generalized abdominal pain: Secondary | ICD-10-CM

## 2022-12-09 NOTE — Assessment & Plan Note (Signed)
Resolved acute generalized abdominal pain with omeprazole 40 mg daily. No associated symptoms such as changes in stool, vomiting, or abdominal tenderness on examination. Discussed omeprazole use, including tapering off after eight weeks and restarting if symptoms recur. Explained typical 8-week course and as-needed use thereafter. - Continue omeprazole 40 mg daily as needed - Consider tapering off omeprazole after eight weeks - Restart omeprazole if symptoms recur

## 2022-12-13 ENCOUNTER — Other Ambulatory Visit: Payer: Self-pay | Admitting: *Deleted

## 2022-12-13 MED ORDER — ANASTROZOLE 1 MG PO TABS: 1.0000 mg | ORAL_TABLET | Freq: Every day | ORAL | 1 refills | Status: DC

## 2023-01-30 ENCOUNTER — Other Ambulatory Visit: Payer: Self-pay | Admitting: Family Medicine

## 2023-02-17 DIAGNOSIS — H5015 Alternating exotropia: Secondary | ICD-10-CM | POA: Diagnosis not present

## 2023-02-17 DIAGNOSIS — H2513 Age-related nuclear cataract, bilateral: Secondary | ICD-10-CM | POA: Diagnosis not present

## 2023-02-17 DIAGNOSIS — H04123 Dry eye syndrome of bilateral lacrimal glands: Secondary | ICD-10-CM | POA: Diagnosis not present

## 2023-03-13 ENCOUNTER — Other Ambulatory Visit: Payer: Self-pay | Admitting: Physician Assistant

## 2023-03-13 DIAGNOSIS — E7800 Pure hypercholesterolemia, unspecified: Secondary | ICD-10-CM

## 2023-03-23 ENCOUNTER — Other Ambulatory Visit: Payer: Self-pay | Admitting: Family Medicine

## 2023-03-23 DIAGNOSIS — E7800 Pure hypercholesterolemia, unspecified: Secondary | ICD-10-CM

## 2023-03-23 NOTE — Telephone Encounter (Signed)
 Copied from CRM [id number]. Topic: Clinical - Medication Refill >> Mar 23, 2023  9:47 AM Priscille Loveless wrote: Most Recent Primary Care Visit:  Provider: Ronnald Ramp  Department: [id number]  Visit Type: OFFICE VISIT  Date: 12/09/2022  Medication: rosuvastatin (CRESTOR) 10 MG tablet  Has the patient contacted their pharmacy? No  Is this the correct pharmacy for this prescription? Yes  This is the patient's preferred pharmacy:  [id number] [place] [facility] - [address] [place] [address] Phone: [phone] Fax: [phone]   Has the prescription been filled recently? No  Is the patient out of the medication? Yes  Has the patient been seen for an appointment in the last year OR does the patient have an upcoming appointment? Yes  Can we respond through MyChart? Yes  Agent: Please be advised that Rx refills may take up to 3 business days. We ask that you follow-up with your pharmacy.

## 2023-03-24 ENCOUNTER — Other Ambulatory Visit: Payer: Self-pay | Admitting: Family Medicine

## 2023-03-24 DIAGNOSIS — E7800 Pure hypercholesterolemia, unspecified: Secondary | ICD-10-CM

## 2023-03-24 MED ORDER — ROSUVASTATIN CALCIUM 10 MG PO TABS: 10.0000 mg | ORAL_TABLET | Freq: Every day | ORAL | 3 refills | Status: DC

## 2023-03-25 ENCOUNTER — Telehealth: Payer: Self-pay | Admitting: *Deleted

## 2023-03-25 ENCOUNTER — Other Ambulatory Visit: Payer: Self-pay

## 2023-03-25 DIAGNOSIS — R921 Mammographic calcification found on diagnostic imaging of breast: Secondary | ICD-10-CM

## 2023-03-25 DIAGNOSIS — C50919 Malignant neoplasm of unspecified site of unspecified female breast: Secondary | ICD-10-CM

## 2023-03-25 DIAGNOSIS — Z1231 Encounter for screening mammogram for malignant neoplasm of breast: Secondary | ICD-10-CM

## 2023-03-25 NOTE — Telephone Encounter (Signed)
 The patient called the [facility] and was told that she would see MD first to see if patient  needs mammogram.

## 2023-03-25 NOTE — Telephone Encounter (Addendum)
 I called Ms. Elizabeth Morgan to clarify what was going on.   She has a new tender area on her left breast and under her arm.   This has been present for about 2 weeks.  This is the side her breast cancer is on and she is not sure if it could be scar tissue or a new issue.    Because she is having a new breast concern she will need to see a provider and have a diagnostic mammogram instead of a screening mammogram.   She is scheduled to see Consuello Masse, NP on Monday.   If diagnostic mammogram is needed/ordered, [facility] will schedule.

## 2023-03-25 NOTE — Telephone Encounter (Signed)
 Please call [facility]. This makes no sense. I have seen her in oct 2024 and this is a routine screening mammo in [place] I think?

## 2023-03-28 ENCOUNTER — Inpatient Hospital Stay: Attending: Nurse Practitioner | Admitting: Nurse Practitioner

## 2023-03-28 VITALS — BP 128/62 | HR 61 | Temp 97.0°F | Resp 14 | Wt 163.0 lb

## 2023-03-28 DIAGNOSIS — Z87891 Personal history of nicotine dependence: Secondary | ICD-10-CM | POA: Diagnosis not present

## 2023-03-28 DIAGNOSIS — Z79811 Long term (current) use of aromatase inhibitors: Secondary | ICD-10-CM | POA: Diagnosis not present

## 2023-03-28 DIAGNOSIS — Z853 Personal history of malignant neoplasm of breast: Secondary | ICD-10-CM

## 2023-03-28 DIAGNOSIS — Z1732 Human epidermal growth factor receptor 2 negative status: Secondary | ICD-10-CM | POA: Insufficient documentation

## 2023-03-28 DIAGNOSIS — Z170 Estrogen receptor positive status [ER+]: Secondary | ICD-10-CM | POA: Diagnosis not present

## 2023-03-28 DIAGNOSIS — Z801 Family history of malignant neoplasm of trachea, bronchus and lung: Secondary | ICD-10-CM | POA: Insufficient documentation

## 2023-03-28 DIAGNOSIS — C50912 Malignant neoplasm of unspecified site of left female breast: Secondary | ICD-10-CM | POA: Insufficient documentation

## 2023-03-28 DIAGNOSIS — N644 Mastodynia: Secondary | ICD-10-CM

## 2023-03-28 DIAGNOSIS — Z803 Family history of malignant neoplasm of breast: Secondary | ICD-10-CM | POA: Insufficient documentation

## 2023-03-28 DIAGNOSIS — Z1721 Progesterone receptor positive status: Secondary | ICD-10-CM | POA: Insufficient documentation

## 2023-03-28 NOTE — Progress Notes (Signed)
 Symptom Management Clinic  [facility] A Department of the [facility]. [facility] [address], [address] [place], [place] [address] [phone] (phone) [phone] (fax)  Patient Care Team: Ronnald Ramp, MD as PCP - General (Family Medicine) Lemar Livings, Merrily Pew, MD as Consulting Physician (General Surgery) Creig Hines, MD as Consulting Physician (Oncology) Carmina Miller, MD as Referring Physician (Radiation Oncology) Scarlett Presto, RN (Inactive) as Oncology Nurse Navigator   Name of the patient: Elizabeth Morgan  [id number]  04/04/1950   Date of visit: 03/28/23  Diagnosis- History of breast cancer  Chief complaint/ Reason for visit- breast pain  Heme/Onc history:  Oncology History  Invasive carcinoma of breast (HCC)  06/15/2019 Initial Diagnosis   Invasive carcinoma of breast (HCC)   07/15/2019 Cancer Staging   Staging form: Breast, AJCC 8th Edition - Pathologic stage from 07/15/2019: Stage IB (pT2, pN0, cM0, G3, ER+, PR+, HER2-) - Signed by Creig Hines, MD on 07/15/2019     Interval history-patient is 73 year old female with above history of breast cancer of the left breast postlumpectomy, now on anastrozole, who presents to symptom management clinic for complaints of left breast pain.  Describes as fullness and tingling sensation.  Symptoms started a couple of weeks ago and have persisted since that time.  She does not palpate any definitive masses. Denies unintentional weight loss or other complaints.   ECOG FS:1 - Symptomatic but completely ambulatory  Review of systems- Review of Systems  Constitutional:  Negative for chills, fever, malaise/fatigue and weight loss.  HENT:  Negative for hearing loss, nosebleeds, sore throat and tinnitus.   Eyes:  Negative for blurred vision and double vision.  Respiratory:  Negative for cough, hemoptysis, shortness of breath and wheezing.   Cardiovascular:   Negative for chest pain, palpitations and leg swelling.  Gastrointestinal:  Negative for abdominal pain, blood in stool, constipation, diarrhea, melena, nausea and vomiting.  Genitourinary:  Negative for dysuria and urgency.  Musculoskeletal:  Negative for back pain, falls, joint pain and myalgias.  Skin:  Negative for itching and rash.  Neurological:  Negative for dizziness, tingling, sensory change, loss of consciousness, weakness and headaches.  Endo/Heme/Allergies:  Negative for environmental allergies. Does not bruise/bleed easily.  Psychiatric/Behavioral:  Negative for depression. The patient is not nervous/anxious and does not have insomnia.      Allergies  Allergen Reactions   Aleve [Naproxen Sodium] Other (See Comments)    BURN STOMACH   Aspirin Other (See Comments)    BURN STOMACH   Chicken Protein     Dizziness   Codeine Nausea And Vomiting   Naproxen Other (See Comments)    Stomach burns     Past Medical History:  Diagnosis Date   Actinic keratosis    Allergy    Breast cancer (HCC) 06/11/2019   DCIS   Cancer (HCC)    left breast   Family history of breast cancer    Family history of lung cancer    Hearing loss    Hernia, abdominal    showed up end of April 2018   History of herpes zoster 11/05/2015   Seasonal allergies    Squamous cell carcinoma in situ (SCCIS) 10/07/2020   left lateral zygoma Moh's 12/01/2020   Squamous cell carcinoma in situ (SCCIS) 10/07/2020   upper mid forehead, MOHs completed 12/15/20   Squamous cell carcinoma of skin 10/07/2020   L lat zygoma - SCCIS   Squamous cell carcinoma of  skin 10/07/2020   upper mid forehead - SCCIS    Past Surgical History:  Procedure Laterality Date   ARTHRODESIS METATARSALPHALANGEAL JOINT (MTPJ) Left 06/03/2016   Procedure: ARTHRODESIS METATARSALPHALANGEAL JOINT (MTPJ);  Surgeon: Recardo Evangelist, DPM;  Location: [facility];  Service: Podiatry;  Laterality: Left;   BREAST BIOPSY Left 06/08/2019    U/S guided   BREAST LUMPECTOMY Left 06/29/2019   DCIS   BREAST LUMPECTOMY WITH SENTINEL LYMPH NODE BIOPSY Left 06/29/2019   Procedure: BREAST LUMPECTOMY WITH SENTINEL LYMPH NODE BX;  Surgeon: Earline Mayotte, MD;  Location: [facility];  Service: General;  Laterality: Left;   CESAREAN SECTION     COLONOSCOPY     COLONOSCOPY WITH PROPOFOL N/A 01/04/2020   Procedure: COLONOSCOPY WITH PROPOFOL;  Surgeon: Earline Mayotte, MD;  Location: [facility];  Service: Endoscopy;  Laterality: N/A;   EYE SURGERY Bilateral    X 2   HAMMER TOE SURGERY Left 06/03/2016   Procedure: HAMMER TOE CORRECTION - 2nd left toe;  Surgeon: Recardo Evangelist, DPM;  Location: [facility];  Service: Podiatry;  Laterality: Left;   TUBAL LIGATION     WEIL OSTEOTOMY Left 06/03/2016   Procedure: WEIL OSTEOTOMY  SECOND TOE;  Surgeon: Recardo Evangelist, DPM;  Location: [facility];  Service: Podiatry;  Laterality: Left;    Social History   Socioeconomic History   Marital status: Married    Spouse name: Not on file   Number of children: 1   Years of education: Not on file   Highest education level: Bachelor's degree (e.g., BA, AB, BS)  Occupational History   Occupation: retired  Tobacco Use   Smoking status: Former    Current packs/day: 0.00    Types: Cigarettes    Start date: 1978    Quit date: 1988    Years since quitting: 37.2   Smokeless tobacco: Never   Tobacco comments:    Quit about 30 years ago  Vaping Use   Vaping status: Never Used  Substance and Sexual Activity   Alcohol use: Yes    Comment: occasionally, 1x/month   Drug use: No   Sexual activity: Never  Other Topics Concern   Not on file  Social History Narrative   Not on file   Social Drivers of Health   Financial Resource Strain: Low Risk  (03/28/2023)   Overall Financial Resource Strain (CARDIA)    Difficulty of Paying Living Expenses: Not hard at all  Food Insecurity: No Food Insecurity (03/28/2023)   Hunger Vital  Sign    Worried About Running Out of Food in the Last Year: Never true    Ran Out of Food in the Last Year: Never true  Transportation Needs: No Transportation Needs (03/28/2023)   PRAPARE - Administrator, Civil Service (Medical): No    Lack of Transportation (Non-Medical): No  Physical Activity: Insufficiently Active (03/28/2023)   Exercise Vital Sign    Days of Exercise per Week: 2 days    Minutes of Exercise per Session: 20 min  Stress: No Stress Concern Present (03/28/2023)   [employer] of Occupational Health - Occupational Stress Questionnaire    Feeling of Stress : Only a little  Social Connections: Unknown (03/28/2023)   Social Connection and Isolation Panel [NHANES]    Frequency of Communication with Friends and Family: More than three times a week    Frequency of Social Gatherings with Friends and Family: Once a week    Attends Religious Services: Patient  declined    Active Member of Clubs or Organizations: Yes    Attends Banker Meetings: Patient declined    Marital Status: Married  Catering manager Violence: Not At Risk (03/15/2022)   Humiliation, Afraid, Rape, and Kick questionnaire    Fear of Current or Ex-Partner: No    Emotionally Abused: No    Physically Abused: No    Sexually Abused: No    Family History  Problem Relation Age of Onset   Dementia Mother    Dementia Father    Hypertension Father    Heart disease Father    Lung cancer Father    Breast cancer Sister 54   Lung cancer Maternal Aunt    Lung cancer Maternal Uncle      Current Outpatient Medications:    anastrozole (ARIMIDEX) 1 MG tablet, Take 1 tablet (1 mg total) by mouth daily., Disp: 90 tablet, Rfl: 1   Calcium Carbonate-Vitamin D 600-400 MG-UNIT tablet, Take 2 tablets by mouth daily., Disp: , Rfl:    cetirizine (ZYRTEC) 10 MG tablet, Take 10 mg by mouth daily as needed (allergies). , Disp: , Rfl:    fluticasone (FLONASE) 50 MCG/ACT nasal spray, Place 1 spray into  both nostrils daily as needed for allergies or rhinitis., Disp: , Rfl:    omeprazole (PRILOSEC) 40 MG capsule, TAKE 1 CAPSULE (40 MG TOTAL) BY MOUTH DAILY., Disp: 90 capsule, Rfl: 1   rosuvastatin (CRESTOR) 10 MG tablet, Take 1 tablet (10 mg total) by mouth daily., Disp: 90 tablet, Rfl: 3   triamcinolone cream (KENALOG) 0.1 %, Apply to rash on ankle BID up to 2 weeks PRN. Avoid applying to face, groin, and axilla. Use as directed. Long-term use can cause thinning of the skin., Disp: 45 g, Rfl: 0  Physical exam:  Vitals:   03/28/23 1034  BP: 128/62  Pulse: 61  Resp: 14  Temp: (!) 97 F (36.1 C)  TempSrc: Tympanic  SpO2: 100%  Weight: 163 lb (73.9 kg)   Physical Exam Vitals reviewed.  Constitutional:      Appearance: She is not ill-appearing.  Chest:     Comments: Breast exam was performed in seated and lying down position. Patient is status post left lumpectomy with a well-healed surgical scar. No evidence of any palpable masses. No evidence of axillary adenopathy. No evidence of any palpable masses or lumps in the right breast. No evidence of right axillary adenopathy Skin:    Coloration: Skin is not pale.     Findings: No bruising.  Neurological:     Mental Status: She is alert and oriented to person, place, and time.  Psychiatric:        Mood and Affect: Mood normal.        Behavior: Behavior normal.     No results found.  Assessment and plan- Patient is a 73 y.o. female   Left breast pain- no mass palpated on exam. Recommend diagnostic mammogram and ultrasound to evaluate further. Lymphedema may also be possible etiology. Orders placed. Follow up based on results.   History of breast cancer- on AI s/p lumpectomy.    Visit Diagnosis 1. Breast pain, left   2. History of left breast cancer     Patient expressed understanding and was in agreement with this plan. She also understands that She can call clinic at any time with any questions, concerns, or complaints.    Thank you for allowing me to participate in the care of this very pleasant patient.  Consuello Masse, DNP, AGNP-C, AOCNP [facility] [phone]

## 2023-03-30 ENCOUNTER — Other Ambulatory Visit: Payer: Self-pay | Admitting: Nurse Practitioner

## 2023-03-30 ENCOUNTER — Ambulatory Visit: Payer: Medicare Other

## 2023-03-30 ENCOUNTER — Ambulatory Visit
Admission: RE | Admit: 2023-03-30 | Discharge: 2023-03-30 | Disposition: A | Discharge status: Home / Self Care | Source: Ambulatory Visit | Attending: Nurse Practitioner | Admitting: Nurse Practitioner

## 2023-03-30 ENCOUNTER — Ambulatory Visit

## 2023-03-30 VITALS — Ht 63.0 in | Wt 157.0 lb

## 2023-03-30 DIAGNOSIS — R92322 Mammographic fibroglandular density, left breast: Secondary | ICD-10-CM | POA: Diagnosis not present

## 2023-03-30 DIAGNOSIS — Z853 Personal history of malignant neoplasm of breast: Secondary | ICD-10-CM | POA: Diagnosis not present

## 2023-03-30 DIAGNOSIS — Z0000 Encounter for general adult medical examination without abnormal findings: Secondary | ICD-10-CM

## 2023-03-30 DIAGNOSIS — N644 Mastodynia: Secondary | ICD-10-CM

## 2023-03-30 NOTE — Patient Instructions (Addendum)
 Elizabeth Morgan , Thank you for taking time to come for your Medicare Wellness Visit. I appreciate your ongoing commitment to your health goals. Please review the following plan we discussed and let me know if I can assist you in the future.   Referrals/Orders/Follow-Ups/Clinician Recommendations: Schedule your bone density test (due 06/17/23) at your convenience. [facility] @ [phone]  This is a list of the screening recommended for you and due dates:  Health Maintenance  Topic Date Due   Zoster (Shingles) Vaccine (1 of 2) Never done   COVID-19 Vaccine (4 - 2024-25 season) 09/19/2022   Flu Shot  04/18/2023*   DEXA scan (bone density measurement)  06/17/2023   Mammogram  06/18/2023   Medicare Annual Wellness Visit  03/29/2024   Colon Cancer Screening  01/03/2025   DTaP/Tdap/Td vaccine (5 - Td or Tdap) 04/21/2029   Pneumonia Vaccine  Completed   Hepatitis C Screening  Completed   HPV Vaccine  Aged Out  *Topic was postponed. The date shown is not the original due date.    Advanced directives: (Copy Requested) Please bring a copy of your health care power of attorney and living will to the office to be added to your chart at your convenience. You can mail to [facility] [address] [place], [place] [address] or email to [email or web address]  Next Medicare Annual Wellness Visit scheduled for next year: Yes, 04/04/24 @ 8:50am (phone visit)

## 2023-03-30 NOTE — Progress Notes (Signed)
 Subjective:   Elizabeth Morgan is a 73 y.o. who presents for a Medicare Wellness preventive visit.  Visit Complete: Virtual I connected with  Jaclyn Prime on 03/30/23 by a audio enabled telemedicine application and verified that I am speaking with the correct person using two identifiers.  Patient Location: Home  Provider Location: Home Office  I discussed the limitations of evaluation and management by telemedicine. The patient expressed understanding and agreed to proceed.  Vital Signs: Because this visit was a virtual/telehealth visit, some criteria may be missing or patient reported. Any vitals not documented were not able to be obtained and vitals that have been documented are patient reported.  VideoDeclined- This patient declined Librarian, academic. Therefore the visit was completed with audio only.  AWV Questionnaire: Yes: Patient Medicare AWV questionnaire was completed by the patient on 03/28/23; I have confirmed that all information answered by patient is correct and no changes since this date.  Cardiac Risk Factors include: advanced age (>42men, >96 women);dyslipidemia     Objective:    Today's Vitals   03/30/23 0925  Weight: 157 lb (71.2 kg)  Height: 5\' 3"  (1.6 m)   Body mass index is 27.81 kg/m.     03/30/2023    9:34 AM 03/28/2023   10:31 AM 11/09/2022    3:17 PM 05/03/2022   10:02 AM 03/15/2022   10:10 AM 10/15/2021    9:55 AM 09/30/2021   11:17 AM  Advanced Directives  Does Patient Have a Medical Advance Directive? Yes No Yes Yes Yes No No  Type of Estate agent of [place];Living will  Healthcare Power of [place];Living will Healthcare Power of [place];Living will     Does patient want to make changes to medical advance directive? No - Patient declined        Copy of Healthcare Power of Attorney in Chart? No - copy requested  No - copy requested      Would patient like information on creating a  medical advance directive?      No - Patient declined No - Patient declined    Current Medications (verified) Outpatient Encounter Medications as of 03/30/2023  Medication Sig   anastrozole (ARIMIDEX) 1 MG tablet Take 1 tablet (1 mg total) by mouth daily.   Calcium Carbonate-Vitamin D 600-400 MG-UNIT tablet Take 2 tablets by mouth daily.   cetirizine (ZYRTEC) 10 MG tablet Take 10 mg by mouth daily as needed (allergies).    fluticasone (FLONASE) 50 MCG/ACT nasal spray Place 1 spray into both nostrils daily as needed for allergies or rhinitis.   omeprazole (PRILOSEC) 40 MG capsule TAKE 1 CAPSULE (40 MG TOTAL) BY MOUTH DAILY.   rosuvastatin (CRESTOR) 10 MG tablet Take 1 tablet (10 mg total) by mouth daily.   triamcinolone cream (KENALOG) 0.1 % Apply to rash on ankle BID up to 2 weeks PRN. Avoid applying to face, groin, and axilla. Use as directed. Long-term use can cause thinning of the skin. (Patient not taking: Reported on 03/30/2023)   No facility-administered encounter medications on file as of 03/30/2023.    Allergies (verified) Aleve [naproxen sodium], Aspirin, Chicken protein, Codeine, and Naproxen   History: Past Medical History:  Diagnosis Date   Actinic keratosis    Allergy    Breast cancer (HCC) 06/11/2019   DCIS   Cancer (HCC)    left breast   Family history of breast cancer    Family history of lung cancer    Hearing loss  Hernia, abdominal    showed up end of April 2018   History of herpes zoster 11/05/2015   Seasonal allergies    Squamous cell carcinoma in situ (SCCIS) 10/07/2020   left lateral zygoma Moh's 12/01/2020   Squamous cell carcinoma in situ (SCCIS) 10/07/2020   upper mid forehead, MOHs completed 12/15/20   Squamous cell carcinoma of skin 10/07/2020   L lat zygoma - SCCIS   Squamous cell carcinoma of skin 10/07/2020   upper mid forehead - SCCIS   Past Surgical History:  Procedure Laterality Date   ARTHRODESIS METATARSALPHALANGEAL JOINT (MTPJ) Left  06/03/2016   Procedure: ARTHRODESIS METATARSALPHALANGEAL JOINT (MTPJ);  Surgeon: Recardo Evangelist, DPM;  Location: [facility];  Service: Podiatry;  Laterality: Left;   BREAST BIOPSY Left 06/08/2019   U/S guided   BREAST LUMPECTOMY Left 06/29/2019   DCIS   BREAST LUMPECTOMY WITH SENTINEL LYMPH NODE BIOPSY Left 06/29/2019   Procedure: BREAST LUMPECTOMY WITH SENTINEL LYMPH NODE BX;  Surgeon: Earline Mayotte, MD;  Location: [facility];  Service: General;  Laterality: Left;   CESAREAN SECTION     COLONOSCOPY     COLONOSCOPY WITH PROPOFOL N/A 01/04/2020   Procedure: COLONOSCOPY WITH PROPOFOL;  Surgeon: Earline Mayotte, MD;  Location: [facility];  Service: Endoscopy;  Laterality: N/A;   EYE SURGERY Bilateral    X 2   HAMMER TOE SURGERY Left 06/03/2016   Procedure: HAMMER TOE CORRECTION - 2nd left toe;  Surgeon: Recardo Evangelist, DPM;  Location: [facility];  Service: Podiatry;  Laterality: Left;   TUBAL LIGATION     WEIL OSTEOTOMY Left 06/03/2016   Procedure: WEIL OSTEOTOMY  SECOND TOE;  Surgeon: Recardo Evangelist, DPM;  Location: [facility];  Service: Podiatry;  Laterality: Left;   Family History  Problem Relation Age of Onset   Dementia Mother    Dementia Father    Hypertension Father    Heart disease Father    Lung cancer Father    Breast cancer Sister 37   Cancer Sister    Lung cancer Maternal Aunt    Lung cancer Maternal Uncle    Social History   Socioeconomic History   Marital status: Married    Spouse name: Rande Lawman   Number of children: 1   Years of education: Not on file   Highest education level: Bachelor's degree (e.g., BA, AB, BS)  Occupational History   Occupation: retired  Tobacco Use   Smoking status: Former    Current packs/day: 0.00    Average packs/day: 0.5 packs/day for 10.0 years (5.0 ttl pk-yrs)    Types: Cigarettes    Start date: 58    Quit date: 1988    Years since quitting: 37.2   Smokeless tobacco: Never   Tobacco  comments:    Quit about 30 years ago  Vaping Use   Vaping status: Never Used  Substance and Sexual Activity   Alcohol use: Not Currently    Comment: occasionally, 1x/month   Drug use: No   Sexual activity: Never  Other Topics Concern   Not on file  Social History Narrative   Not on file   Social Drivers of Health   Financial Resource Strain: Low Risk  (03/30/2023)   Overall Financial Resource Strain (CARDIA)    Difficulty of Paying Living Expenses: Not hard at all  Food Insecurity: No Food Insecurity (03/30/2023)   Hunger Vital Sign    Worried About Running Out of Food in the Last Year: Never true  Ran Out of Food in the Last Year: Never true  Transportation Needs: No Transportation Needs (03/30/2023)   PRAPARE - Administrator, Civil Service (Medical): No    Lack of Transportation (Non-Medical): No  Physical Activity: Insufficiently Active (03/30/2023)   Exercise Vital Sign    Days of Exercise per Week: 2 days    Minutes of Exercise per Session: 20 min  Stress: No Stress Concern Present (03/30/2023)   [employer] of Occupational Health - Occupational Stress Questionnaire    Feeling of Stress : Not at all  Social Connections: Unknown (03/30/2023)   Social Connection and Isolation Panel [NHANES]    Frequency of Communication with Friends and Family: More than three times a week    Frequency of Social Gatherings with Friends and Family: Once a week    Attends Religious Services: Patient declined    Database administrator or Organizations: Yes    Attends Engineer, structural: Patient declined    Marital Status: Married    Tobacco Counseling Counseling given: Not Answered Tobacco comments: Quit about 30 years ago    Clinical Intake:  Pre-visit preparation completed: Yes  Pain : No/denies pain     BMI - recorded: 27.81 Nutritional Status: BMI 25 -29 Overweight Nutritional Risks: None Diabetes: No  How often do you need to have someone  help you when you read instructions, pamphlets, or other written materials from your doctor or pharmacy?: 1 - Never  Interpreter Needed?: No  Information entered by :: Tora Kindred, CMA   Activities of Daily Living     03/30/2023    9:27 AM 03/28/2023    8:35 AM  In your present state of health, do you have any difficulty performing the following activities:  Hearing? 0 0  Vision? 0 0  Difficulty concentrating or making decisions? 0 0  Walking or climbing stairs? 0 0  Dressing or bathing? 0 0  Doing errands, shopping? 0 0  Preparing Food and eating ? N N  Using the Toilet? N N  In the past six months, have you accidently leaked urine? Y Y  Comment wears panty liner   Do you have problems with loss of bowel control? N N  Managing your Medications? N N  Managing your Finances? N N  Housekeeping or managing your Housekeeping? N N    Patient Care Team: Ronnald Ramp, MD as PCP - General (Family Medicine) Lemar Livings Merrily Pew, MD as Consulting Physician (General Surgery) Creig Hines, MD as Consulting Physician (Oncology) Carmina Miller, MD as Referring Physician (Radiation Oncology) Scarlett Presto, RN (Inactive) as Oncology Nurse Navigator Brooke Dare, Earl Gala, MD as Consulting Physician (Ophthalmology)  Indicate any recent Medical Services you may have received from other than [facility] providers in the past year (date may be approximate).     Assessment:   This is a routine wellness examination for Maelin.  Hearing/Vision screen Hearing Screening - Comments:: Denies hearing loss Vision Screening - Comments:: Gets eye exams, Dr. Willey Blade [facility]   Goals Addressed               This Visit's Progress     Patient Stated (pt-stated)        Lose 15 lbs       Depression Screen     03/30/2023    9:31 AM 12/09/2022    8:58 AM 11/05/2022    8:50 AM 05/26/2022    9:22 AM 04/06/2022    8:53 AM  03/15/2022    9:58 AM 04/02/2021    9:23 AM  PHQ 2/9  Scores  PHQ - 2 Score 0 0 0 0 0 0 1  PHQ- 9 Score 0 0  0 0  2    Fall Risk     03/30/2023    9:35 AM 03/28/2023    8:35 AM 12/09/2022    9:00 AM 11/05/2022    8:49 AM 05/26/2022    9:21 AM  Fall Risk   Falls in the past year? 0 0 0 0 0  Number falls in past yr: 0 0 0 0 0  Injury with Fall? 0 0 0 0 0  Risk for fall due to : No Fall Risks    No Fall Risks  Follow up Falls prevention discussed;Falls evaluation completed   Falls evaluation completed Falls evaluation completed    MEDICARE RISK AT HOME:  Medicare Risk at Home Any stairs in or around the home?: Yes If so, are there any without handrails?: Yes (2 steps without handrails) Home free of loose throw rugs in walkways, pet beds, electrical cords, etc?: Yes Adequate lighting in your home to reduce risk of falls?: Yes Life alert?: No Use of a cane, walker or w/c?: No Grab bars in the bathroom?: No Shower chair or bench in shower?: No Elevated toilet seat or a handicapped toilet?: No  TIMED UP AND GO:  Was the test performed?  No  Cognitive Function: 6CIT completed        03/30/2023    9:36 AM 03/15/2022   10:07 AM  6CIT Screen  What Year? 0 points 0 points  What month? 0 points 0 points  What time? 0 points 0 points  Count back from 20 0 points 0 points  Months in reverse 0 points 0 points  Repeat phrase 0 points 0 points  Total Score 0 points 0 points    Immunizations Immunization History  Administered Date(s) Administered   PFIZER(Purple Top)SARS-COV-2 Vaccination 03/13/2019, 04/03/2019, 11/13/2019   PNEUMOCOCCAL CONJUGATE-20 11/05/2022   Td 05/27/2003, 04/22/2019   Td (Adult),unspecified 04/22/2019   Tdap 02/03/2017    Screening Tests Health Maintenance  Topic Date Due   Zoster Vaccines- Shingrix (1 of 2) Never done   COVID-19 Vaccine (4 - 2024-25 season) 09/19/2022   INFLUENZA VACCINE  04/18/2023 (Originally 08/19/2022)   DEXA SCAN  06/17/2023   MAMMOGRAM  06/18/2023   Medicare Annual Wellness (AWV)   03/29/2024   Colonoscopy  01/03/2025   DTaP/Tdap/Td (5 - Td or Tdap) 04/21/2029   Pneumonia Vaccine 39+ Years old  Completed   Hepatitis C Screening  Completed   HPV VACCINES  Aged Out    Health Maintenance  Health Maintenance Due  Topic Date Due   Zoster Vaccines- Shingrix (1 of 2) Never done   COVID-19 Vaccine (4 - 2024-25 season) 09/19/2022   Health Maintenance Items Addressed: See Nurse Notes  Additional Screening:  Vision Screening: Recommended annual ophthalmology exams for early detection of glaucoma and other disorders of the eye.  Dental Screening: Recommended annual dental exams for proper oral hygiene  Community Resource Referral / Chronic Care Management: CRR required this visit?  No   CCM required this visit?  No     Plan:     I have personally reviewed and noted the following in the patient's chart:   Medical and social history Use of alcohol, tobacco or illicit drugs  Current medications and supplements including opioid prescriptions. Patient is not currently taking opioid  prescriptions. Functional ability and status Nutritional status Physical activity Advanced directives List of other physicians Hospitalizations, surgeries, and ER visits in previous 12 months Vitals Screenings to include cognitive, depression, and falls Referrals and appointments  In addition, I have reviewed and discussed with patient certain preventive protocols, quality metrics, and best practice recommendations. A written personalized care plan for preventive services as well as general preventive health recommendations were provided to patient.     Tora Kindred, CMA   03/30/2023   After Visit Summary: (MyChart) Due to this being a telephonic visit, the after visit summary with patients personalized plan was offered to patient via MyChart   Notes:  Declined Covid and shingles vaccines MMG scheduled for today, 03/30/23.  Patient will schedule DEXA due 06/17/23 (order  already placed by Dr. Smith Robert)

## 2023-03-31 ENCOUNTER — Telehealth: Payer: Self-pay | Admitting: Nurse Practitioner

## 2023-03-31 NOTE — Telephone Encounter (Signed)
 Spoke to patient by phone and reviewed mammogram and ultrasound. No evidence of malignancy. Symptoms are stable but persist. Recommend snug fitting sports bra to see if compression may help. If symptoms worsen, recommend reevaluation. Follow up as planned.

## 2023-04-04 ENCOUNTER — Encounter: Payer: Self-pay | Admitting: Oncology

## 2023-04-04 ENCOUNTER — Other Ambulatory Visit: Payer: Self-pay | Admitting: Oncology

## 2023-04-04 DIAGNOSIS — Z1231 Encounter for screening mammogram for malignant neoplasm of breast: Secondary | ICD-10-CM

## 2023-04-04 DIAGNOSIS — Z853 Personal history of malignant neoplasm of breast: Secondary | ICD-10-CM

## 2023-04-18 ENCOUNTER — Other Ambulatory Visit

## 2023-04-18 ENCOUNTER — Encounter

## 2023-04-27 ENCOUNTER — Encounter: Payer: Self-pay | Admitting: Family Medicine

## 2023-04-27 ENCOUNTER — Ambulatory Visit (INDEPENDENT_AMBULATORY_CARE_PROVIDER_SITE_OTHER): Admitting: Family Medicine

## 2023-04-27 VITALS — BP 127/60 | HR 62 | Ht 63.0 in | Wt 161.0 lb

## 2023-04-27 DIAGNOSIS — M436 Torticollis: Secondary | ICD-10-CM

## 2023-04-27 DIAGNOSIS — L650 Telogen effluvium: Secondary | ICD-10-CM

## 2023-04-27 DIAGNOSIS — E7800 Pure hypercholesterolemia, unspecified: Secondary | ICD-10-CM | POA: Diagnosis not present

## 2023-04-27 DIAGNOSIS — C50919 Malignant neoplasm of unspecified site of unspecified female breast: Secondary | ICD-10-CM | POA: Diagnosis not present

## 2023-04-27 DIAGNOSIS — H8112 Benign paroxysmal vertigo, left ear: Secondary | ICD-10-CM

## 2023-04-27 MED ORDER — TIZANIDINE HCL 4 MG PO TABS: 4.0000 mg | ORAL_TABLET | Freq: Four times a day (QID) | ORAL | 0 refills | Status: AC | PRN

## 2023-04-27 MED ORDER — MECLIZINE HCL 12.5 MG PO TABS: 12.5000 mg | ORAL_TABLET | Freq: Three times a day (TID) | ORAL | 0 refills | Status: AC | PRN

## 2023-04-27 NOTE — Assessment & Plan Note (Signed)
  Stable -Continue anastrozole 1mg  daily. -continue to follow with oncology as scheduled

## 2023-04-27 NOTE — Assessment & Plan Note (Signed)
 Chronic hypercholesterolemia managed with rosuvastatin 10 mg daily. Last lipid panel in October showed LDL of 87 and total cholesterol of 172, with an ASCVD score of 8.5. No concerns regarding cholesterol management. Increased physical activity by walking more, beneficial for cholesterol control. - Continue rosuvastatin 10 mg daily. - Encourage continued physical activity and walking.

## 2023-04-27 NOTE — Progress Notes (Signed)
 Established patient visit   Patient: Elizabeth Morgan   DOB: 1950-01-31   73 y.o. Female  MRN: [id number] Visit Date: 04/27/2023  Today's healthcare provider: Ronnald Ramp, MD   Chief Complaint  Patient presents with   Hyperlipidemia    No concerns   Torticollis    Stiff neck for a couple months    Dizziness    For about a month    Subjective     HPI     Hyperlipidemia    Additional comments: No concerns        Torticollis    Additional comments: Stiff neck for a couple months         Dizziness    Additional comments: For about a month       Last edited by Thedora Hinders, CMA on 04/27/2023  9:31 AM.       Discussed the use of AI scribe software for clinical note transcription with the patient, who gave verbal consent to proceed.  History of Present Illness Elizabeth Morgan "Elizabeth Morgan" is a 73 year old female who presents for follow-up on chronic hypercholesterolemia.  She has been taking Crestor 10 mg daily, and her last lipid panel in October showed an LDL of 87 and total cholesterol of 172. She has no concerns regarding her hyperlipidemia and mentions that she has been walking more as part of her routine.  She has been experiencing dizziness for the past month, describing it as feeling 'wobbly' and 'dizzy' when getting up, looking up, or looking down. The dizziness lasts for a few seconds and occurs even when she moves slowly. She sometimes feels dizzy when getting up from bed. There have been no changes in her hearing, no episodes of passing out or falling, and the dizziness resolves allowing her to walk normally.  She reports a stiff neck for the past couple of months, which she attributes to her posture and frequent use of a tablet. She experiences pain when getting up and sometimes needs to hold her head due to the discomfort. She has been trying to manage it by stretching and moving.  She is concerned about hair loss, noting that she  loses a significant amount of hair when brushing or combing, and finds hair on her pajama top in the morning. She describes the hair loss as occurring all over, especially on the top of her head, and mentions that the hair falls out in whole strands without a bulb at the end. She does not take any supplements for hair, skin, and nails.     Past Medical History:  Diagnosis Date   Actinic keratosis    Allergy    Breast cancer (HCC) 06/11/2019   DCIS   Cancer (HCC)    left breast   Family history of breast cancer    Family history of lung cancer    Hearing loss    Hernia, abdominal    showed up end of April 2018   History of herpes zoster 11/05/2015   Seasonal allergies    Squamous cell carcinoma in situ (SCCIS) 10/07/2020   left lateral zygoma Moh's 12/01/2020   Squamous cell carcinoma in situ (SCCIS) 10/07/2020   upper mid forehead, MOHs completed 12/15/20   Squamous cell carcinoma of skin 10/07/2020   L lat zygoma - SCCIS   Squamous cell carcinoma of skin 10/07/2020   upper mid forehead - SCCIS    Medications: Outpatient Medications Prior to Visit  Medication Sig  anastrozole (ARIMIDEX) 1 MG tablet Take 1 tablet (1 mg total) by mouth daily.   Calcium Carbonate-Vitamin D 600-400 MG-UNIT tablet Take 2 tablets by mouth daily.   cetirizine (ZYRTEC) 10 MG tablet Take 10 mg by mouth daily as needed (allergies).    fluticasone (FLONASE) 50 MCG/ACT nasal spray Place 1 spray into both nostrils daily as needed for allergies or rhinitis.   omeprazole (PRILOSEC) 40 MG capsule TAKE 1 CAPSULE (40 MG TOTAL) BY MOUTH DAILY.   rosuvastatin (CRESTOR) 10 MG tablet Take 1 tablet (10 mg total) by mouth daily.   triamcinolone cream (KENALOG) 0.1 % Apply to rash on ankle BID up to 2 weeks PRN. Avoid applying to face, groin, and axilla. Use as directed. Long-term use can cause thinning of the skin. (Patient not taking: Reported on 04/27/2023)   No facility-administered medications prior to visit.     Review of Systems  Last CBC Lab Results  Component Value Date   WBC 6.6 04/06/2022   HGB 13.2 04/06/2022   HCT 38.5 04/06/2022   MCV 89 04/06/2022   MCH 30.6 04/06/2022   RDW 12.2 04/06/2022   PLT 229 04/06/2022   Last metabolic panel Lab Results  Component Value Date   GLUCOSE 91 04/06/2022   NA 140 04/06/2022   K 5.0 04/06/2022   CL 102 04/06/2022   CO2 23 04/06/2022   BUN 15 04/06/2022   CREATININE 0.72 04/06/2022   EGFR 90 04/06/2022   CALCIUM 9.8 04/06/2022   PROT 7.1 04/06/2022   ALBUMIN 4.7 04/06/2022   LABGLOB 2.4 04/06/2022   AGRATIO 2.0 04/06/2022   BILITOT 0.4 04/06/2022   ALKPHOS 92 04/06/2022   AST 30 04/06/2022   ALT 28 04/06/2022   ANIONGAP 8 11/09/2019   Last lipids Lab Results  Component Value Date   CHOL 172 11/02/2022   HDL 71 11/02/2022   LDLCALC 87 11/02/2022   TRIG 72 11/02/2022   CHOLHDL 2.4 11/02/2022   The 10-year ASCVD risk score (Arnett DK, et al., 2019) is: 8.5%   Last hemoglobin A1c No results found for: "HGBA1C" Last thyroid functions Lab Results  Component Value Date   TSH 1.410 04/15/2021   Last vitamin D No results found for: "25OHVITD2", "25OHVITD3", "VD25OH" Last vitamin B12 and Folate No results found for: "VITAMINB12", "FOLATE"      Objective    BP 127/60   Pulse 62   Ht 5\' 3"  (1.6 m)   Wt 161 lb (73 kg)   SpO2 99%   BMI 28.52 kg/m  BP Readings from Last 3 Encounters:  04/27/23 127/60  03/28/23 128/62  12/09/22 (!) 122/55   Wt Readings from Last 3 Encounters:  04/27/23 161 lb (73 kg)  03/30/23 157 lb (71.2 kg)  03/28/23 163 lb (73.9 kg)        Physical Exam Vitals reviewed.  Constitutional:      General: She is not in acute distress.    Appearance: Normal appearance. She is not ill-appearing, toxic-appearing or diaphoretic.  Eyes:     Conjunctiva/sclera: Conjunctivae normal.  Neck:     Comments: Limited ROM with side to side rotation of neck, no tenderness today, no erythema nor  swelling noted on exam  Cardiovascular:     Rate and Rhythm: Normal rate and regular rhythm.     Pulses: Normal pulses.     Heart sounds: Normal heart sounds. No murmur heard.    No friction rub. No gallop.  Pulmonary:     Effort: Pulmonary  effort is normal. No respiratory distress.     Breath sounds: Normal breath sounds. No stridor. No wheezing, rhonchi or rales.  Abdominal:     General: Bowel sounds are normal. There is no distension.     Palpations: Abdomen is soft.     Tenderness: There is no abdominal tenderness.  Musculoskeletal:     Cervical back: No signs of trauma.     Right lower leg: No edema.     Left lower leg: No edema.  Skin:    Findings: No erythema or rash.     Comments: Diffuse, subtle hair thinning noted on exam   Neurological:     Mental Status: She is alert and oriented to person, place, and time.     Comments: No torsional nystagmus, patient reports dizziness when lying supine with head turned towards left side   Psychiatric:        Mood and Affect: Mood and affect normal.        Speech: Speech normal.        Behavior: Behavior normal. Behavior is cooperative.      No results found for any visits on 04/27/23.  Assessment & Plan     Problem List Items Addressed This Visit       Other   Pure hypercholesterolemia - Primary   Chronic hypercholesterolemia managed with rosuvastatin 10 mg daily. Last lipid panel in October showed LDL of 87 and total cholesterol of 172, with an ASCVD score of 8.5. No concerns regarding cholesterol management. Increased physical activity by walking more, beneficial for cholesterol control. - Continue rosuvastatin 10 mg daily. - Encourage continued physical activity and walking.      Invasive carcinoma of breast (HCC)    Stable -Continue anastrozole 1mg  daily. -continue to follow with oncology as scheduled       Relevant Medications   meclizine (ANTIVERT) 12.5 MG tablet   Other Visit Diagnoses       Stiff neck        Relevant Medications   tiZANidine (ZANAFLEX) 4 MG tablet     Benign paroxysmal positional vertigo of left ear       Relevant Medications   meclizine (ANTIVERT) 12.5 MG tablet     Telogen effluvium       Relevant Orders   TSH+T4F+T3Free   CBC   Comprehensive metabolic panel with GFR        Assessment & Plan Benign Paroxysmal Positional Vertigo (BPPV) Dizziness for one month, primarily with positional changes such as getting up from bed or looking up and down. Episodes last a few seconds without hearing changes, syncope, or falls. No nystagmus during Dix-Hallpike maneuver, but dizziness experienced, suggesting possible BPPV. Discussed inner ear crystals causing vertigo and management with vestibular therapy and medications. Meclizine may help, but effectiveness varies. Vestibular therapy may be considered if dizziness is bothersome. - Prescribe meclizine 12.5 mg three times daily as needed for dizziness. - Provide instructions for the Epley maneuver. - Consider referral to vestibular therapy if symptoms persist.  Stiff Neck Chronic, intermittent  Neck stiffness for a couple of months, possibly related to posture and tablet use. Stiffness is intermittent and sometimes severe, requiring support when getting up. Suspected muscle spasms as a contributing factor. - Prescribe tizanidine 4 mg every six hours as needed for muscle spasms.  Telogen Effluvium Significant diffuse hair loss, noticing hair on pillow and clothing. Suspect telogen effluvium; plan to rule out thyroid dysfunction and nutritional deficiencies. Not on medications associated with hair  loss, no evidence of alopecia areata or tinea capitis. Recommend starting with hair, skin, and nails supplements before considering medications. - Order CBC to check for iron deficiency anemia. - Order TSH, T4, and T3 to assess thyroid function. - Order complete metabolic panel  - Recommend starting hair, skin, and nails  supplements.   General HM  -mammogram ordered & scheduled as well as DEXA scan       Return in about 5 weeks (around 06/01/2023) for Dizziness,hair loss .         Ronnald Ramp, MD  [facility] [phone] (phone) [phone] (fax)  [facility]

## 2023-04-27 NOTE — Patient Instructions (Signed)
 VISIT SUMMARY:  Today, you came in for a follow-up on your chronic hypercholesterolemia and discussed several new concerns, including dizziness, a stiff neck, and hair loss.  YOUR PLAN:  -BENIGN PAROXYSMAL POSITIONAL VERTIGO (BPPV): BPPV is a condition where tiny crystals in your inner ear become dislodged, causing dizziness with certain head movements. You will start taking Meclizine 12.5 mg three times daily as needed for dizziness. Additionally, you were given instructions for the Epley maneuver, a series of head movements that can help reposition the crystals. If your symptoms persist, we may consider referring you to vestibular therapy.  -STIFF NECK: Your neck stiffness is likely due to muscle spasms, possibly from posture and frequent tablet use. You will start taking Tizanidine 4 mg every six hours as needed to help relieve the muscle spasms.  -TELOGEN EFFLUVIUM: Telogen Effluvium is a temporary condition where hair falls out more than usual, often due to stress or nutritional deficiencies. We will check your blood for iron levels, thyroid function, and other nutritional markers. In the meantime, you should start taking hair, skin, and nails supplements.  -HYPERCHOLESTEROLEMIA: Your chronic high cholesterol is being well-managed with your current medication, Rosuvastatin 10 mg daily. Your last lipid panel showed good results. Continue taking your medication and keep up with your increased physical activity, as walking more is beneficial for your cholesterol levels.  INSTRUCTIONS:  Please follow up with the recommended blood tests: CBC, TSH, T4, T3, complete metabolic panel, and vitamin D levels. If your dizziness persists despite the Meclizine and Epley maneuver, consider scheduling an appointment for vestibular therapy. Continue your current medication for hypercholesterolemia and maintain your increased physical activity.

## 2023-04-28 LAB — COMPREHENSIVE METABOLIC PANEL WITH GFR
ALT: 35 IU/L — ABNORMAL HIGH (ref 0–32)
AST: 39 IU/L (ref 0–40)
Albumin: 4.5 g/dL (ref 3.9–4.9)
Alkaline Phosphatase: 124 IU/L — ABNORMAL HIGH (ref 44–121)
BUN/Creatinine Ratio: 27 (ref 12–28)
BUN: 19 mg/dL (ref 8–27)
Bilirubin Total: 0.4 mg/dL (ref 0.0–1.2)
CO2: 20 mmol/L (ref 20–29)
Calcium: 9.7 mg/dL (ref 8.7–10.3)
Chloride: 101 mmol/L (ref 96–106)
Creatinine, Ser: 0.7 mg/dL (ref 0.57–1.00)
Globulin, Total: 2.1 g/dL (ref 1.5–4.5)
Glucose: 83 mg/dL (ref 70–99)
Potassium: 4.7 mmol/L (ref 3.5–5.2)
Sodium: 139 mmol/L (ref 134–144)
Total Protein: 6.6 g/dL (ref 6.0–8.5)
eGFR: 93 mL/min/{1.73_m2} (ref >59)

## 2023-04-28 LAB — TSH+T4F+T3FREE
Free T4: 1.34 ng/dL (ref 0.82–1.77)
T3, Free: 3.3 pg/mL (ref 2.0–4.4)
TSH: 1.1 u[IU]/mL (ref 0.450–4.500)

## 2023-04-28 LAB — CBC
Hematocrit: 39.2 % (ref 34.0–46.6)
Hemoglobin: 13.1 g/dL (ref 11.1–15.9)
MCH: 30.2 pg (ref 26.6–33.0)
MCHC: 33.4 g/dL (ref 31.5–35.7)
MCV: 90 fL (ref 79–97)
Platelets: 237 10*3/uL (ref 150–450)
RBC: 4.34 x10E6/uL (ref 3.77–5.28)
RDW: 12.1 % (ref 11.7–15.4)
WBC: 6.5 10*3/uL (ref 3.4–10.8)

## 2023-05-02 ENCOUNTER — Encounter: Payer: Self-pay | Admitting: Family Medicine

## 2023-05-10 ENCOUNTER — Encounter: Payer: Self-pay | Admitting: Oncology

## 2023-05-10 ENCOUNTER — Inpatient Hospital Stay: Payer: Medicare Other | Attending: Nurse Practitioner | Admitting: Oncology

## 2023-05-10 VITALS — BP 113/54 | HR 81 | Temp 99.1°F | Resp 18 | Ht 63.0 in | Wt 160.0 lb

## 2023-05-10 DIAGNOSIS — Z803 Family history of malignant neoplasm of breast: Secondary | ICD-10-CM

## 2023-05-10 DIAGNOSIS — Z1732 Human epidermal growth factor receptor 2 negative status: Secondary | ICD-10-CM | POA: Diagnosis not present

## 2023-05-10 DIAGNOSIS — Z79811 Long term (current) use of aromatase inhibitors: Secondary | ICD-10-CM | POA: Diagnosis not present

## 2023-05-10 DIAGNOSIS — Z1721 Progesterone receptor positive status: Secondary | ICD-10-CM | POA: Diagnosis not present

## 2023-05-10 DIAGNOSIS — Z170 Estrogen receptor positive status [ER+]: Secondary | ICD-10-CM

## 2023-05-10 DIAGNOSIS — C50912 Malignant neoplasm of unspecified site of left female breast: Secondary | ICD-10-CM

## 2023-05-10 DIAGNOSIS — Z08 Encounter for follow-up examination after completed treatment for malignant neoplasm: Secondary | ICD-10-CM

## 2023-05-10 DIAGNOSIS — Z87891 Personal history of nicotine dependence: Secondary | ICD-10-CM | POA: Diagnosis not present

## 2023-05-10 DIAGNOSIS — Z801 Family history of malignant neoplasm of trachea, bronchus and lung: Secondary | ICD-10-CM | POA: Diagnosis not present

## 2023-05-10 DIAGNOSIS — Z79899 Other long term (current) drug therapy: Secondary | ICD-10-CM

## 2023-05-10 DIAGNOSIS — Z923 Personal history of irradiation: Secondary | ICD-10-CM

## 2023-05-10 DIAGNOSIS — Z5181 Encounter for therapeutic drug level monitoring: Secondary | ICD-10-CM

## 2023-05-10 NOTE — Progress Notes (Signed)
 Hematology/Oncology Consult note [facility]  Telephone:([phone] [phone]  Patient Care Team: Mimi Alt, MD as PCP - General (Family Medicine) Avonne Boettcher, MD as Consulting Physician (Oncology) Rosa College, MD as Consulting Physician (Ophthalmology) Conrado Delay, DO as Consulting Physician (General Surgery) Artemio Larry, MD (Dermatology)   Name of the patient: Elizabeth Morgan  [id number]  October 03, 1950   Date of visit: 05/10/23  Diagnosis- pathological prognosticstage Ia invasive mammary carcinoma of the left breast pT2 pN0 cM0 grade 3 ER/PR positive HER-2/neu negative s/p lumpectomy   Chief complaint/ Reason for visit-routine follow-up of breast cancer  Heme/Onc history: Patient is a 73 year old female who underwent a screening mammogram on 05/21/2019 which showed abnormality in the left breast.  This was followed by diagnostic mammogram and ultrasound which showed a 2.4 x 1.4 x 1.9 cm mass in the left breast.  No lymphadenopathy seen in the left axilla.  Core biopsy showed a 15 mm grade 3 invasive mammary carcinoma with focal mucinous features.  ER/PR and HER-2 status was not available at the time of her visit   Patient is currently doing well for her age she does not have any significant comorbidities.  She is independent of her ADLs and IADLs.Patient sister had breast cancer at the age of 86.   Final pathology showed invasive mammary carcinoma with negative margins 2.4 x 2.8 x 1.5 cm, grade 3 ER/PR positive HER-2 -1 sentinel lymph node negative for malignancy.   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  At her age this does not translate into absolute chemotherapy benefit.  Patient therefore did not require any adjuvant chemotherapy. Patient completed adjuvant radiation therapy and started Arimidex  in September 2021    Interval  history-she had some pain involving the left breast In March 2025 and underwent mammogram and ultrasound which did not show any abnormal findings.  She is scheduled for a routine diagnostic mammogram in June.  Presently breast pain has resolved.  She is tolerating Arimidex  well without any significant side effects.  ECOG PS- 1 Pain scale- 0   Review of systems- Review of Systems  Constitutional:  Negative for chills, fever, malaise/fatigue and weight loss.  HENT:  Negative for congestion, ear discharge and nosebleeds.   Eyes:  Negative for blurred vision.  Respiratory:  Negative for cough, hemoptysis, sputum production, shortness of breath and wheezing.   Cardiovascular:  Negative for chest pain, palpitations, orthopnea and claudication.  Gastrointestinal:  Negative for abdominal pain, blood in stool, constipation, diarrhea, heartburn, melena, nausea and vomiting.  Genitourinary:  Negative for dysuria, flank pain, frequency, hematuria and urgency.  Musculoskeletal:  Negative for back pain, joint pain and myalgias.  Skin:  Negative for rash.  Neurological:  Negative for dizziness, tingling, focal weakness, seizures, weakness and headaches.  Endo/Heme/Allergies:  Does not bruise/bleed easily.  Psychiatric/Behavioral:  Negative for depression and suicidal ideas. The patient does not have insomnia.       Allergies  Allergen Reactions   Aleve [Naproxen Sodium] Other (See Comments)    BURN STOMACH   Aspirin Other (See Comments)    BURN STOMACH   Chicken Protein     Dizziness   Codeine Nausea And Vomiting   Naproxen Other (See Comments)    Stomach burns      Past Medical History:  Diagnosis Date   Actinic keratosis    Allergy  Breast cancer (HCC) 06/11/2019   DCIS   Cancer (HCC)    left breast   Family history of breast cancer    Family history of lung cancer    Hearing loss    Hernia, abdominal    showed up end of April 2018   History of herpes zoster 11/05/2015    Seasonal allergies    Squamous cell carcinoma in situ (SCCIS) 10/07/2020   left lateral zygoma Moh's 12/01/2020   Squamous cell carcinoma in situ (SCCIS) 10/07/2020   upper mid forehead, MOHs completed 12/15/20   Squamous cell carcinoma of skin 10/07/2020   L lat zygoma - SCCIS   Squamous cell carcinoma of skin 10/07/2020   upper mid forehead - SCCIS     Past Surgical History:  Procedure Laterality Date   ARTHRODESIS METATARSALPHALANGEAL JOINT (MTPJ) Left 06/03/2016   Procedure: ARTHRODESIS METATARSALPHALANGEAL JOINT (MTPJ);  Surgeon: Sharlyn Deaner, DPM;  Location: [facility];  Service: Podiatry;  Laterality: Left;   BREAST BIOPSY Left 06/08/2019   U/S guided   BREAST LUMPECTOMY Left 06/29/2019   DCIS   BREAST LUMPECTOMY WITH SENTINEL LYMPH NODE BIOPSY Left 06/29/2019   Procedure: BREAST LUMPECTOMY WITH SENTINEL LYMPH NODE BX;  Surgeon: Marshall Skeeter, MD;  Location: [facility];  Service: General;  Laterality: Left;   CESAREAN SECTION     COLONOSCOPY     COLONOSCOPY WITH PROPOFOL  N/A 01/04/2020   Procedure: COLONOSCOPY WITH PROPOFOL ;  Surgeon: Marshall Skeeter, MD;  Location: [facility];  Service: Endoscopy;  Laterality: N/A;   EYE SURGERY Bilateral    X 2   HAMMER TOE SURGERY Left 06/03/2016   Procedure: HAMMER TOE CORRECTION - 2nd left toe;  Surgeon: Sharlyn Deaner, DPM;  Location: [facility];  Service: Podiatry;  Laterality: Left;   TUBAL LIGATION     WEIL OSTEOTOMY Left 06/03/2016   Procedure: WEIL OSTEOTOMY  SECOND TOE;  Surgeon: Sharlyn Deaner, DPM;  Location: [facility];  Service: Podiatry;  Laterality: Left;    Social History   Socioeconomic History   Marital status: Married    Spouse name: Bonna Bustard   Number of children: 1   Years of education: Not on file   Highest education level: Bachelor's degree (e.g., BA, AB, BS)  Occupational History   Occupation: retired  Tobacco Use   Smoking status: Former    Current packs/day: 0.00     Average packs/day: 0.5 packs/day for 10.0 years (5.0 ttl pk-yrs)    Types: Cigarettes    Start date: 68    Quit date: 1988    Years since quitting: 37.3   Smokeless tobacco: Never   Tobacco comments:    Quit about 30 years ago  Vaping Use   Vaping status: Never Used  Substance and Sexual Activity   Alcohol use: Not Currently    Comment: occasionally, 1x/month   Drug use: No   Sexual activity: Never  Other Topics Concern   Not on file  Social History Narrative   Not on file   Social Drivers of Health   Financial Resource Strain: Low Risk  (03/30/2023)   Overall Financial Resource Strain (CARDIA)    Difficulty of Paying Living Expenses: Not hard at all  Food Insecurity: No Food Insecurity (03/30/2023)   Hunger Vital Sign    Worried About Running Out of Food in the Last Year: Never true    Ran Out of Food in the Last Year: Never true  Transportation Needs: No Transportation Needs (03/30/2023)  PRAPARE - Administrator, Civil Service (Medical): No    Lack of Transportation (Non-Medical): No  Physical Activity: Insufficiently Active (03/30/2023)   Exercise Vital Sign    Days of Exercise per Week: 2 days    Minutes of Exercise per Session: 20 min  Stress: No Stress Concern Present (03/30/2023)   [employer] of Occupational Health - Occupational Stress Questionnaire    Feeling of Stress : Not at all  Social Connections: Unknown (03/30/2023)   Social Connection and Isolation Panel [NHANES]    Frequency of Communication with Friends and Family: More than three times a week    Frequency of Social Gatherings with Friends and Family: Once a week    Attends Religious Services: Patient declined    Database administrator or Organizations: Yes    Attends Banker Meetings: Patient declined    Marital Status: Married  Catering manager Violence: Not At Risk (03/30/2023)   Humiliation, Afraid, Rape, and Kick questionnaire    Fear of Current or Ex-Partner:  No    Emotionally Abused: No    Physically Abused: No    Sexually Abused: No    Family History  Problem Relation Age of Onset   Dementia Mother    Dementia Father    Hypertension Father    Heart disease Father    Lung cancer Father    Breast cancer Sister 80   Cancer Sister    Lung cancer Maternal Aunt    Lung cancer Maternal Uncle      Current Outpatient Medications:    anastrozole  (ARIMIDEX ) 1 MG tablet, Take 1 tablet (1 mg total) by mouth daily., Disp: 90 tablet, Rfl: 1   Calcium  Carbonate-Vitamin D 600-400 MG-UNIT tablet, Take 2 tablets by mouth daily., Disp: , Rfl:    cetirizine (ZYRTEC) 10 MG tablet, Take 10 mg by mouth daily as needed (allergies). , Disp: , Rfl:    fluticasone (FLONASE) 50 MCG/ACT nasal spray, Place 1 spray into both nostrils daily as needed for allergies or rhinitis., Disp: , Rfl:    meclizine  (ANTIVERT ) 12.5 MG tablet, Take 1 tablet (12.5 mg total) by mouth 3 (three) times daily as needed for dizziness., Disp: 30 tablet, Rfl: 0   omeprazole  (PRILOSEC) 40 MG capsule, TAKE 1 CAPSULE (40 MG TOTAL) BY MOUTH DAILY., Disp: 90 capsule, Rfl: 1   rosuvastatin  (CRESTOR ) 10 MG tablet, Take 1 tablet (10 mg total) by mouth daily., Disp: 90 tablet, Rfl: 3   tiZANidine  (ZANAFLEX ) 4 MG tablet, Take 1 tablet (4 mg total) by mouth every 6 (six) hours as needed for muscle spasms., Disp: 30 tablet, Rfl: 0   triamcinolone  cream (KENALOG ) 0.1 %, Apply to rash on ankle BID up to 2 weeks PRN. Avoid applying to face, groin, and axilla. Use as directed. Long-term use can cause thinning of the skin. (Patient not taking: Reported on 04/27/2023), Disp: 45 g, Rfl: 0  Physical exam:  Vitals:   05/10/23 1036  BP: (!) 113/54  Pulse: 81  Resp: 18  Temp: 99.1 F (37.3 C)  TempSrc: Tympanic  SpO2: 98%  Weight: 160 lb (72.6 kg)  Height: 5\' 3"  (1.6 m)   Physical Exam Cardiovascular:     Rate and Rhythm: Normal rate and regular rhythm.     Heart sounds: Normal heart sounds.   Pulmonary:     Effort: Pulmonary effort is normal.     Breath sounds: Normal breath sounds.  Skin:    General: Skin is warm  and dry.  Neurological:     Mental Status: She is alert and oriented to person, place, and time.    Breast exam was performed in seated and lying down position. Patient is status post left lumpectomy with a well-healed surgical scar. No evidence of any palpable masses. No evidence of axillary adenopathy. No evidence of any palpable masses or lumps in the right breast. No evidence of right axillary adenopathy   I have personally reviewed labs listed below:    Latest Ref Rng & Units 04/27/2023   10:12 AM  CMP  Glucose 70 - 99 mg/dL 83   BUN 8 - 27 mg/dL 19   Creatinine 1.61 - 1.00 mg/dL 0.96   Sodium 045 - 409 mmol/L 139   Potassium 3.5 - 5.2 mmol/L 4.7   Chloride 96 - 106 mmol/L 101   CO2 20 - 29 mmol/L 20   Calcium  8.7 - 10.3 mg/dL 9.7   Total Protein 6.0 - 8.5 g/dL 6.6   Total Bilirubin 0.0 - 1.2 mg/dL 0.4   Alkaline Phos 44 - 121 IU/L 124   AST 0 - 40 IU/L 39   ALT 0 - 32 IU/L 35       Latest Ref Rng & Units 04/27/2023   10:12 AM  CBC  WBC 3.4 - 10.8 x10E3/uL 6.5   Hemoglobin 11.1 - 15.9 g/dL 81.1   Hematocrit 91.4 - 46.6 % 39.2   Platelets 150 - 450 x10E3/uL 237      Assessment and plan- Patient is a 73 y.o. female  with history of stage I left breast cancer ER/PR positive HER2 negative status postlumpectomy adjuvant radiation therapy.  She is presently on Arimidex  and this is a routine follow-up visit for breast cancer  Patient is tolerating Arimidex  well without any significant side effects.  Clinically she is doing well with no signs and symptoms of recurrence based on today's exam.  Her mammogram and bone density scan is already scheduled for 2 months from now.  I will see her back in 6 months, labs.  She will continue Arimidex  until September of next year along with calcium  and vitamin D   Visit Diagnosis 1. Encounter for follow-up  surveillance of breast cancer   2. Visit for monitoring Arimidex  therapy   3. High risk medication use      Dr. Seretha Dance, MD, MPH [facility] [phone] 05/10/2023 1:01 PM

## 2023-05-25 ENCOUNTER — Encounter (HOSPITAL_COMMUNITY): Payer: Self-pay

## 2023-06-01 ENCOUNTER — Other Ambulatory Visit: Payer: Self-pay | Admitting: Oncology

## 2023-06-01 ENCOUNTER — Ambulatory Visit (INDEPENDENT_AMBULATORY_CARE_PROVIDER_SITE_OTHER): Admitting: Family Medicine

## 2023-06-01 DIAGNOSIS — M436 Torticollis: Secondary | ICD-10-CM

## 2023-06-02 ENCOUNTER — Encounter: Payer: Self-pay | Admitting: Family Medicine

## 2023-06-02 NOTE — Progress Notes (Signed)
 Visit was canceled. No MD evaluation needed

## 2023-06-21 ENCOUNTER — Ambulatory Visit
Admission: RE | Admit: 2023-06-21 | Discharge: 2023-06-21 | Disposition: A | Discharge status: Home / Self Care | Source: Ambulatory Visit | Attending: Oncology

## 2023-06-21 ENCOUNTER — Ambulatory Visit
Admission: RE | Admit: 2023-06-21 | Discharge: 2023-06-21 | Disposition: A | Discharge status: Home / Self Care | Source: Ambulatory Visit | Attending: Oncology | Admitting: Oncology

## 2023-06-21 DIAGNOSIS — Z853 Personal history of malignant neoplasm of breast: Secondary | ICD-10-CM | POA: Insufficient documentation

## 2023-06-21 DIAGNOSIS — Z79899 Other long term (current) drug therapy: Secondary | ICD-10-CM | POA: Insufficient documentation

## 2023-06-21 DIAGNOSIS — R928 Other abnormal and inconclusive findings on diagnostic imaging of breast: Secondary | ICD-10-CM | POA: Diagnosis not present

## 2023-06-21 DIAGNOSIS — M810 Age-related osteoporosis without current pathological fracture: Secondary | ICD-10-CM | POA: Diagnosis not present

## 2023-06-21 DIAGNOSIS — Z780 Asymptomatic menopausal state: Secondary | ICD-10-CM | POA: Insufficient documentation

## 2023-06-21 DIAGNOSIS — C50919 Malignant neoplasm of unspecified site of unspecified female breast: Secondary | ICD-10-CM | POA: Insufficient documentation

## 2023-06-21 DIAGNOSIS — Z1231 Encounter for screening mammogram for malignant neoplasm of breast: Secondary | ICD-10-CM | POA: Insufficient documentation

## 2023-06-21 DIAGNOSIS — R92323 Mammographic fibroglandular density, bilateral breasts: Secondary | ICD-10-CM | POA: Diagnosis not present

## 2023-06-28 DIAGNOSIS — Z170 Estrogen receptor positive status [ER+]: Secondary | ICD-10-CM | POA: Diagnosis not present

## 2023-06-28 DIAGNOSIS — C50212 Malignant neoplasm of upper-inner quadrant of left female breast: Secondary | ICD-10-CM | POA: Diagnosis not present

## 2023-07-27 ENCOUNTER — Other Ambulatory Visit: Payer: Self-pay | Admitting: Family Medicine

## 2023-11-02 ENCOUNTER — Inpatient Hospital Stay: Attending: Oncology | Admitting: Oncology

## 2023-11-02 ENCOUNTER — Encounter: Payer: Self-pay | Admitting: Oncology

## 2023-11-02 VITALS — BP 127/56 | HR 62 | Temp 99.1°F | Resp 20 | Ht 63.0 in | Wt 165.8 lb

## 2023-11-02 DIAGNOSIS — Z79811 Long term (current) use of aromatase inhibitors: Secondary | ICD-10-CM | POA: Diagnosis not present

## 2023-11-02 DIAGNOSIS — Z5181 Encounter for therapeutic drug level monitoring: Secondary | ICD-10-CM

## 2023-11-02 DIAGNOSIS — Z1721 Progesterone receptor positive status: Secondary | ICD-10-CM | POA: Insufficient documentation

## 2023-11-02 DIAGNOSIS — Z170 Estrogen receptor positive status [ER+]: Secondary | ICD-10-CM | POA: Insufficient documentation

## 2023-11-02 DIAGNOSIS — Z79899 Other long term (current) drug therapy: Secondary | ICD-10-CM

## 2023-11-02 DIAGNOSIS — C50912 Malignant neoplasm of unspecified site of left female breast: Secondary | ICD-10-CM | POA: Insufficient documentation

## 2023-11-02 DIAGNOSIS — Z79810 Long term (current) use of selective estrogen receptor modulators (SERMs): Secondary | ICD-10-CM | POA: Insufficient documentation

## 2023-11-02 DIAGNOSIS — Z801 Family history of malignant neoplasm of trachea, bronchus and lung: Secondary | ICD-10-CM | POA: Insufficient documentation

## 2023-11-02 DIAGNOSIS — M810 Age-related osteoporosis without current pathological fracture: Secondary | ICD-10-CM | POA: Insufficient documentation

## 2023-11-02 DIAGNOSIS — Z803 Family history of malignant neoplasm of breast: Secondary | ICD-10-CM | POA: Insufficient documentation

## 2023-11-02 DIAGNOSIS — Z1732 Human epidermal growth factor receptor 2 negative status: Secondary | ICD-10-CM | POA: Insufficient documentation

## 2023-11-02 DIAGNOSIS — Z853 Personal history of malignant neoplasm of breast: Secondary | ICD-10-CM

## 2023-11-02 DIAGNOSIS — Z87891 Personal history of nicotine dependence: Secondary | ICD-10-CM | POA: Diagnosis not present

## 2023-11-02 DIAGNOSIS — Z923 Personal history of irradiation: Secondary | ICD-10-CM | POA: Diagnosis not present

## 2023-11-02 MED ORDER — TAMOXIFEN CITRATE 20 MG PO TABS: 20.0000 mg | ORAL_TABLET | Freq: Every day | ORAL | 3 refills | Status: AC

## 2023-11-02 MED ORDER — ALENDRONATE SODIUM 70 MG PO TABS: 70.0000 mg | ORAL_TABLET | ORAL | 2 refills | Status: AC

## 2023-11-02 NOTE — Progress Notes (Signed)
 Per Dr. Melanee cancel arimidex , switching to tamoxifen instead.  Outbound call to [facility] [phone], spoke to Hough who is cancelling arimidex  on their end.

## 2023-11-02 NOTE — Progress Notes (Signed)
 Hematology/Oncology Consult note Elizabeth Morgan  [phone] [phone]  Patient Care Team: Elizabeth Coyer, MD as PCP - General (Family Medicine) Elizabeth Annah BROCKS, MD as Consulting Physician (Oncology) Elizabeth Adine Anes, MD as Consulting Physician (Ophthalmology) Elizabeth Millet, DO as Consulting Physician (General Surgery) Elizabeth Sawyer, MD (Dermatology)   Name of the patient: Elizabeth Morgan  [id number]  [birth year]   Date of visit: 11/02/23  Diagnosis- pathological prognosticstage Ia invasive mammary carcinoma of the left breast pT2 pN0 cM0 grade 3 ER/PR positive HER-2/neu negative s/p lumpectomy     Chief complaint/ Reason for visit-routine follow-up of breast cancer on Arimidex   Heme/Onc history: Patient is a 73 year old female who underwent a screening mammogram on 05/21/2019 which showed abnormality in the left breast.  This was followed by diagnostic mammogram and ultrasound which showed a 2.4 x 1.4 x 1.9 cm mass in the left breast.  No lymphadenopathy seen in the left axilla.  Core biopsy showed a 15 mm grade 3 invasive mammary carcinoma with focal mucinous features.  ER/PR and HER-2 status was not available at the time of her visit   Patient is currently doing well for her age she does not have any significant comorbidities.  She is independent of her ADLs and IADLs.Patient sister had breast cancer at the age of 9.   Final pathology showed invasive mammary carcinoma with negative margins 2.4 x 2.8 x 1.5 cm, grade 3 ER/PR positive HER-2 -1 sentinel lymph node negative for malignancy.   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  At her age this does not translate into absolute chemotherapy benefit.  Patient therefore did not require any adjuvant chemotherapy. Patient completed adjuvant radiation therapy and started Arimidex  in September  2021  Interval history-she is tolerating Arimidex  well and denies any acute complaints at this time.  Appetite and weight have remained stable.  Denies any new aches and pains anywhere  ECOG PS- 1 Pain scale- 0   Review of systems- Review of Systems  Constitutional:  Negative for chills, fever, malaise/fatigue and weight loss.  HENT:  Negative for congestion, ear discharge and nosebleeds.   Eyes:  Negative for blurred vision.  Respiratory:  Negative for cough, hemoptysis, sputum production, shortness of breath and wheezing.   Cardiovascular:  Negative for chest pain, palpitations, orthopnea and claudication.  Gastrointestinal:  Negative for abdominal pain, blood in stool, constipation, diarrhea, heartburn, melena, nausea and vomiting.  Genitourinary:  Negative for dysuria, flank pain, frequency, hematuria and urgency.  Musculoskeletal:  Negative for back pain, joint pain and myalgias.  Skin:  Negative for rash.  Neurological:  Negative for dizziness, tingling, focal weakness, seizures, weakness and headaches.  Endo/Heme/Allergies:  Does not bruise/bleed easily.  Psychiatric/Behavioral:  Negative for depression and suicidal ideas. The patient does not have insomnia.       Allergies  Allergen Reactions   Aleve [Naproxen Sodium] Other (See Comments)    BURN STOMACH   Aspirin Other (See Comments)    BURN STOMACH   Chicken Protein     Dizziness   Codeine Nausea And Vomiting   Naproxen Other (See Comments)    Stomach burns      Past Medical History:  Diagnosis Date   Actinic keratosis    Allergy    Breast cancer (HCC) 06/11/2019   DCIS   Cancer (HCC)    left breast   Family history of  breast cancer    Family history of lung cancer    Hearing loss    Hernia, abdominal    showed up end of April 2018   History of herpes zoster 11/05/2015   Seasonal allergies    Squamous cell carcinoma in situ (SCCIS) 10/07/2020   left lateral zygoma Moh's 12/01/2020   Squamous cell  carcinoma in situ (SCCIS) 10/07/2020   upper mid forehead, MOHs completed 12/15/20   Squamous cell carcinoma of skin 10/07/2020   L lat zygoma - SCCIS   Squamous cell carcinoma of skin 10/07/2020   upper mid forehead - SCCIS     Past Surgical History:  Procedure Laterality Date   ARTHRODESIS METATARSALPHALANGEAL JOINT (MTPJ) Left 06/03/2016   Procedure: ARTHRODESIS METATARSALPHALANGEAL JOINT (MTPJ);  Surgeon: Lilli Cough, DPM;  Location: [facility];  Service: Podiatry;  Laterality: Left;   BREAST BIOPSY Left 06/08/2019   U/S guided   BREAST LUMPECTOMY Left 06/29/2019   DCIS   BREAST LUMPECTOMY WITH SENTINEL LYMPH NODE BIOPSY Left 06/29/2019   Procedure: BREAST LUMPECTOMY WITH SENTINEL LYMPH NODE BX;  Surgeon: Dessa Reyes ORN, MD;  Location: [facility];  Service: General;  Laterality: Left;   CESAREAN SECTION     COLONOSCOPY     COLONOSCOPY WITH PROPOFOL  N/A 01/04/2020   Procedure: COLONOSCOPY WITH PROPOFOL ;  Surgeon: Dessa Reyes ORN, MD;  Location: [facility];  Service: Endoscopy;  Laterality: N/A;   EYE SURGERY Bilateral    X 2   HAMMER TOE SURGERY Left 06/03/2016   Procedure: HAMMER TOE CORRECTION - 2nd left toe;  Surgeon: Lilli Cough, DPM;  Location: [facility];  Service: Podiatry;  Laterality: Left;   TUBAL LIGATION     WEIL OSTEOTOMY Left 06/03/2016   Procedure: WEIL OSTEOTOMY  SECOND TOE;  Surgeon: Lilli Cough, DPM;  Location: [facility];  Service: Podiatry;  Laterality: Left;    Social History   Socioeconomic History   Marital status: Married    Spouse name: [name]   Number of children: 1   Years of education: Not on file   Highest education level: Bachelor's degree (e.g., BA, AB, BS)  Occupational History   Occupation: retired  Tobacco Use   Smoking status: Former    Current packs/day: 0.00    Average packs/day: 0.5 packs/day for 10.0 years (5.0 ttl pk-yrs)    Types: Cigarettes    Start date: 49    Quit date: 1988     Years since quitting: 37.8   Smokeless tobacco: Never   Tobacco comments:    Quit about 30 years ago  Vaping Use   Vaping status: Never Used  Substance and Sexual Activity   Alcohol use: Not Currently    Comment: occasionally, 1x/month   Drug use: No   Sexual activity: Never  Other Topics Concern   Not on file  Social History Narrative   Not on file   Social Drivers of Health   Financial Resource Strain: Low Risk  (03/30/2023)   Overall Financial Resource Strain (CARDIA)    Difficulty of Paying Living Expenses: Not hard at all  Food Insecurity: No Food Insecurity (03/30/2023)   Hunger Vital Sign    Worried About Running Out of Food in the Last Year: Never true    Ran Out of Food in the Last Year: Never true  Transportation Needs: No Transportation Needs (03/30/2023)   PRAPARE - Administrator, Civil Service (Medical): No    Lack of Transportation (Non-Medical): No  Physical Activity: Insufficiently Active (03/30/2023)   Exercise Vital Sign    Days of Exercise per Week: 2 days    Minutes of Exercise per Session: 20 min  Stress: No Stress Concern Present (03/30/2023)   [employer] of Occupational Health - Occupational Stress Questionnaire    Feeling of Stress : Not at all  Social Connections: Unknown (03/30/2023)   Social Connection and Isolation Panel    Frequency of Communication with Friends and Family: More than three times a week    Frequency of Social Gatherings with Friends and Family: Once a week    Attends Religious Services: Patient declined    Database administrator or Organizations: Yes    Attends Banker Meetings: Patient declined    Marital Status: Married  Catering manager Violence: Not At Risk (03/30/2023)   Humiliation, Afraid, Rape, and Kick questionnaire    Fear of Current or Ex-Partner: No    Emotionally Abused: No    Physically Abused: No    Sexually Abused: No    Family History  Problem Relation Age of Onset    Dementia Mother    Dementia Father    Hypertension Father    Heart disease Father    Lung cancer Father    Breast cancer Sister 82   Cancer Sister    Lung cancer Maternal Aunt    Lung cancer Maternal Uncle      Current Outpatient Medications:    alendronate (FOSAMAX) 70 MG tablet, Take 1 tablet (70 mg total) by mouth once a week. Take with a full glass of water on an empty stomach., Disp: 12 tablet, Rfl: 2   Calcium  Carbonate-Vitamin D 600-400 MG-UNIT tablet, Take 2 tablets by mouth daily., Disp: , Rfl:    cetirizine (ZYRTEC) 10 MG tablet, Take 10 mg by mouth daily as needed (allergies). , Disp: , Rfl:    fluticasone (FLONASE) 50 MCG/ACT nasal spray, Place 1 spray into both nostrils daily as needed for allergies or rhinitis., Disp: , Rfl:    meclizine  (ANTIVERT ) 12.5 MG tablet, Take 1 tablet (12.5 mg total) by mouth 3 (three) times daily as needed for dizziness., Disp: 30 tablet, Rfl: 0   omeprazole  (PRILOSEC) 40 MG capsule, TAKE 1 CAPSULE (40 MG TOTAL) BY MOUTH DAILY., Disp: 90 capsule, Rfl: 2   rosuvastatin  (CRESTOR ) 10 MG tablet, Take 1 tablet (10 mg total) by mouth daily., Disp: 90 tablet, Rfl: 3   tamoxifen (NOLVADEX) 20 MG tablet, Take 1 tablet (20 mg total) by mouth daily., Disp: 30 tablet, Rfl: 3   tiZANidine  (ZANAFLEX ) 4 MG tablet, Take 1 tablet (4 mg total) by mouth every 6 (six) hours as needed for muscle spasms., Disp: 30 tablet, Rfl: 0   triamcinolone  cream (KENALOG ) 0.1 %, Apply to rash on ankle BID up to 2 weeks PRN. Avoid applying to face, groin, and axilla. Use as directed. Long-term use can cause thinning of the skin., Disp: 45 g, Rfl: 0  Physical exam:  Vitals:   11/02/23 0932  BP: (!) 127/56  Pulse: 62  Resp: 20  Temp: 99.1 F (37.3 C)  TempSrc: Tympanic  SpO2: 96%  Weight: 165 lb 12.8 oz (75.2 kg)  Height: 5' 3 (1.6 m)   Physical Exam Cardiovascular:     Rate and Rhythm: Normal rate and regular rhythm.     Heart sounds: Normal heart sounds.  Pulmonary:      Effort: Pulmonary effort is normal.     Breath sounds: Normal breath sounds.  Skin:    General: Skin is warm and dry.  Neurological:     Mental Status: She is alert and oriented to person, place, and time.    Breast exam was performed in seated and lying down position. Patient is status post left lumpectomy with a well-healed surgical scar. No evidence of any palpable masses. No evidence of axillary adenopathy. No evidence of any palpable masses or lumps in the right breast. No evidence of right axillary adenopathy   I have personally reviewed labs listed below:    Latest Ref Rng & Units 04/27/2023   10:12 AM  CMP  Glucose 70 - 99 mg/dL 83   BUN 8 - 27 mg/dL 19   Creatinine 9.42 - 1.00 mg/dL 9.29   Sodium 865 - 855 mmol/L 139   Potassium 3.5 - 5.2 mmol/L 4.7   Chloride 96 - 106 mmol/L 101   CO2 20 - 29 mmol/L 20   Calcium  8.7 - 10.3 mg/dL 9.7   Total Protein 6.0 - 8.5 g/dL 6.6   Total Bilirubin 0.0 - 1.2 mg/dL 0.4   Alkaline Phos 44 - 121 IU/L 124   AST 0 - 40 IU/L 39   ALT 0 - 32 IU/L 35       Latest Ref Rng & Units 04/27/2023   10:12 AM  CBC  WBC 3.4 - 10.8 x10E3/uL 6.5   Hemoglobin 11.1 - 15.9 g/dL 86.8   Hematocrit 65.9 - 46.6 % 39.2   Platelets 150 - 450 x10E3/uL 237     Assessment and plan- Patient is a 73 y.o. female with history of stage I left breast cancer ER/PR positive HER2 negative status postlumpectomy adjuvant radiation therapy and presently on Arimidex  here for routine follow-up  Breast cancer under surveillance, transitioning endocrine therapy due to osteoporosis In year four of surveillance. Arimidex  effective for recurrence reduction but contributes to bone density loss. Bone density scan shows progression to osteoporosis. - Discontinue Arimidex . - Prescribe tamoxifen for one year to complete 5 years of endocrine therapy.  Discussed risks and benefits of tamoxifen including all but not limited to possible risk of hot flashes mood swings increased risk  of cataracts DVT and endometrial cancer.  Osteoporosis of the forearm, initiating bisphosphonate therapy Bone density scan shows osteoporosis in the forearm with T-score of -2.6. Increased fracture risk. - Prescribe Fosamax (alendronate) once weekly. - Instruct on proper administration of Fosamax: take first thing in the morning, do not mix with other medications for one hour, and remain upright for one hour after taking.  Discussed risks and benefits of Fosamax including all but not limited to possible nausea and dyspepsia symptoms.  Patient understands and agrees to proceed as planned - Discuss with primary care doctor about checking vitamin D levels.   Visit Diagnosis 1. Encounter for follow-up surveillance of breast cancer   2. Osteoporosis of forearm   3. High risk medication use   4. Visit for monitoring Arimidex  therapy      Dr. Annah Skene, MD, MPH [facility] [phone] 11/02/2023 12:27 PM

## 2023-11-09 ENCOUNTER — Ambulatory Visit: Admitting: Oncology

## 2023-11-14 ENCOUNTER — Ambulatory Visit: Payer: Medicare Other | Admitting: Dermatology

## 2023-11-14 DIAGNOSIS — L720 Epidermal cyst: Secondary | ICD-10-CM | POA: Diagnosis not present

## 2023-11-14 DIAGNOSIS — L814 Other melanin hyperpigmentation: Secondary | ICD-10-CM | POA: Diagnosis not present

## 2023-11-14 DIAGNOSIS — L578 Other skin changes due to chronic exposure to nonionizing radiation: Secondary | ICD-10-CM

## 2023-11-14 DIAGNOSIS — D229 Melanocytic nevi, unspecified: Secondary | ICD-10-CM

## 2023-11-14 DIAGNOSIS — I8393 Asymptomatic varicose veins of bilateral lower extremities: Secondary | ICD-10-CM

## 2023-11-14 DIAGNOSIS — D1801 Hemangioma of skin and subcutaneous tissue: Secondary | ICD-10-CM

## 2023-11-14 DIAGNOSIS — L821 Other seborrheic keratosis: Secondary | ICD-10-CM | POA: Diagnosis not present

## 2023-11-14 DIAGNOSIS — W908XXA Exposure to other nonionizing radiation, initial encounter: Secondary | ICD-10-CM

## 2023-11-14 DIAGNOSIS — Z1283 Encounter for screening for malignant neoplasm of skin: Secondary | ICD-10-CM | POA: Diagnosis not present

## 2023-11-14 DIAGNOSIS — L570 Actinic keratosis: Secondary | ICD-10-CM

## 2023-11-14 DIAGNOSIS — Z86007 Personal history of in-situ neoplasm of skin: Secondary | ICD-10-CM | POA: Diagnosis not present

## 2023-11-14 DIAGNOSIS — L729 Follicular cyst of the skin and subcutaneous tissue, unspecified: Secondary | ICD-10-CM

## 2023-11-14 NOTE — Progress Notes (Signed)
 Follow-Up Visit   Subjective  Elizabeth Morgan is a 73 y.o. female who presents for the following: Skin Cancer Screening and Full Body Skin Exam Hx of SCC IS, Hx of AKs  The patient presents for Total-Body Skin Exam (TBSE) for skin cancer screening and mole check. The patient has spots, moles and lesions to be evaluated, some may be new or changing and the patient may have concern these could be cancer.    The following portions of the chart were reviewed this encounter and updated as appropriate: medications, allergies, medical history  Review of Systems:  No other skin or systemic complaints except as noted in HPI or Assessment and Plan.  Objective  Well appearing patient in no apparent distress; mood and affect are within normal limits.  A full examination was performed including scalp, head, eyes, ears, nose, lips, neck, chest, axillae, abdomen, back, buttocks, bilateral upper extremities, bilateral lower extremities, hands, feet, fingers, toes, fingernails, and toenails. All findings within normal limits unless otherwise noted below.   Relevant physical exam findings are noted in the Assessment and Plan.  L zygoma x 1, R zygoma x 1, R infraocular x 1 (3) Pink scaly macules  Assessment & Plan   SKIN CANCER SCREENING PERFORMED TODAY.  ACTINIC DAMAGE - Chronic condition, secondary to cumulative UV/sun exposure - diffuse scaly erythematous macules with underlying dyspigmentation - Recommend daily broad spectrum sunscreen SPF 30+ to sun-exposed areas, reapply every 2 hours as needed.  - Staying in the shade or wearing long sleeves, sun glasses (UVA+UVB protection) and wide brim hats (4-inch brim around the entire circumference of the hat) are also recommended for sun protection.  - Call for new or changing lesions.  LENTIGINES, SEBORRHEIC KERATOSES, HEMANGIOMAS - Benign normal skin lesions - Benign-appearing - Call for any changes  MELANOCYTIC NEVI - Tan-brown and/or  pink-flesh-colored symmetric macules and papules - Benign appearing on exam today - Observation - Call clinic for new or changing moles - Recommend daily use of broad spectrum spf 30+ sunscreen to sun-exposed areas.   HISTORY OF SQUAMOUS CELL CARCINOMA IN SITU OF THE SKIN - No evidence of recurrence today- L lat zygoma, upper mid forehead - Recommend regular full body skin exams - Recommend daily broad spectrum sunscreen SPF 30+ to sun-exposed areas, reapply every 2 hours as needed.  - Call if any new or changing lesions are noted between office visits    EPIDERMAL INCLUSION CYST Spinal mid back Exam: firm white papule with punctum at spinal mid back  Benign-appearing. Exam most consistent with an epidermal inclusion cyst. Discussed that a cyst is a benign growth that can grow over time and sometimes get irritated or inflamed. Recommend observation if it is not bothersome. Discussed option of surgical excision to remove it if it is growing, symptomatic, or other changes noted. Please call for new or changing lesions so they can be evaluated.  Varicose Veins/Spider Veins Legs - Dilated blue, purple or red veins at the lower extremities - Reassured - Smaller vessels can be treated by sclerotherapy (a procedure to inject a medicine into the veins to make them disappear) if desired, but the treatment is not covered by insurance. Larger vessels may be covered if symptomatic and we would refer to vascular surgeon if treatment desired.  AK (ACTINIC KERATOSIS) (3) L zygoma x 1, R zygoma x 1, R infraocular x 1 (3) Actinic keratoses are precancerous spots that appear secondary to cumulative UV radiation exposure/sun exposure over time. They are  chronic with expected duration over 1 year. A portion of actinic keratoses will progress to squamous cell carcinoma of the skin. It is not possible to reliably predict which spots will progress to skin cancer and so treatment is recommended to prevent  development of skin cancer.  Recommend daily broad spectrum sunscreen SPF 30+ to sun-exposed areas, reapply every 2 hours as needed.  Recommend staying in the shade or wearing long sleeves, sun glasses (UVA+UVB protection) and wide brim hats (4-inch brim around the entire circumference of the hat). Call for new or changing lesions. Destruction of lesion - L zygoma x 1, R zygoma x 1, R infraocular x 1 (3)  Destruction method: cryotherapy   Informed consent: discussed and consent obtained   Lesion destroyed using liquid nitrogen: Yes   Region frozen until ice ball extended beyond lesion: Yes   Outcome: patient tolerated procedure well with no complications   Post-procedure details: wound care instructions given   Additional details:  Prior to procedure, discussed risks of blister formation, small wound, skin dyspigmentation, or rare scar following cryotherapy. Recommend Vaseline ointment to treated areas while healing.   Return in about 1 year (around 11/13/2024) for TBSE, Hx of SCC IS, Hx of AKs.  I, Grayce Saunas, RMA, am acting as scribe for Rexene Rattler, MD .   Documentation: I have reviewed the above documentation for accuracy and completeness, and I agree with the above.  Rexene Rattler, MD

## 2023-11-14 NOTE — Patient Instructions (Addendum)
 Cryotherapy Aftercare  Wash gently with soap and water everyday.   Apply Vaseline and Band-Aid daily until healed.   Recommend daily broad spectrum sunscreen SPF 30+ to sun-exposed areas, reapply every 2 hours as needed. Call for new or changing lesions.  Staying in the shade or wearing long sleeves, sun glasses (UVA+UVB protection) and wide brim hats (4-inch brim around the entire circumference of the hat) are also recommended for sun protection.    Actinic Keratosis  What is an actinic keratosis? An actinic keratosis (plural: actinic keratoses) is growth on the surface of the skin that usually appears as a red, hard, crusty or scaly bump.   What causes actinic keratoses? Repeated prolonged sun exposure causes skin damage, especially in fair-skinned persons. Sun-damaged skin becomes dry and wrinkled and may form rough, scaly spots called actinic keratoses. These rough spots remain on the skin even though the crust or scale on top is picked off.   Why treat actinic keratoses? Actinic keratoses are not skin cancers, but because they may sometimes turn cancerous they are called "pre-cancerous". Not all will turn to skin cancer, and it usually takes several years for this to happen. Because it is much easier to treat an actinic keratosis then it is to remove a skin cancer, actinic keratoses should be treated to prevent future skin cancer.   How are actinic keratoses treated? The most common way of treating actinic keratoses is to freeze them with liquid nitrogen. Freezing causes scabbing and shedding of the sun-damaged skin. Healing after a removal usually takes two weeks, depending on the size and location of the keratosis. Hands and legs heal more slowly than the face. The skin's final appearance is usually excellent. There are several topical medications that can be used to treat actinic keratoses. These medications generally have side effects of redness, crusting, and pain. Some are used for a  few days, and some for several months before the actinic keratosis is completely gone. Photodynamic therapy is another alternative to freezing actinic keratoses. This treatment is done in a physician's office. A medication is applied to the area of skin with actinic keratoses, and it is allowed to soak in for one or more hours. A special light is then applied to the skin. Side effects include redness, burning, and peeling.  How can you prevent actinic keratoses? Protection from the sun is the best way to prevent actinic keratoses. The use of proper clothing and sunscreens can prevent the sun damage that leads to an actinic keratosis.  Unfortunately, some sun damage is permanent. Once sun damage has progressed to the point where actinic keratoses develop, new keratoses may appear even without further sun exposure. However, even in skin that is already heavily sun damaged, good sun protection can help reduce the number of actinic keratoses that will appear.     Seborrheic Keratosis  What causes seborrheic keratoses? Seborrheic keratoses are harmless, common skin growths that first appear during adult life.  As time goes by, more growths appear.  Some people may develop a large number of them.  Seborrheic keratoses appear on both covered and uncovered body parts.  They are not caused by sunlight.  The tendency to develop seborrheic keratoses can be inherited.  They vary in color from skin-colored to gray, brown, or even black.  They can be either smooth or have a rough, warty surface.   Seborrheic keratoses are superficial and look as if they were stuck on the skin.  Under the microscope this type  of keratosis looks like layers upon layers of skin.  That is why at times the top layer may seem to fall off, but the rest of the growth remains and re-grows.    Treatment Seborrheic keratoses do not need to be treated, but can easily be removed in the office.  Seborrheic keratoses often cause symptoms when they  rub on clothing or jewelry.  Lesions can be in the way of shaving.  If they become inflamed, they can cause itching, soreness, or burning.  Removal of a seborrheic keratosis can be accomplished by freezing, burning, or surgery. If any spot bleeds, scabs, or grows rapidly, please return to have it checked, as these can be an indication of a skin cancer.  Due to recent changes in healthcare laws, you may see results of your pathology and/or laboratory studies on MyChart before the doctors have had a chance to review them. We understand that in some cases there may be results that are confusing or concerning to you. Please understand that not all results are received at the same time and often the doctors may need to interpret multiple results in order to provide you with the best plan of care or course of treatment. Therefore, we ask that you please give us  2 business days to thoroughly review all your results before contacting the office for clarification. Should we see a critical lab result, you will be contacted sooner.   If You Need Anything After Your Visit  If you have any questions or concerns for your doctor, please call our main line at [phone] and press option 4 to reach your doctor's medical assistant. If no one answers, please leave a voicemail as directed and we will return your call as soon as possible. Messages left after 4 pm will be answered the following business day.   You may also send us  a message via MyChart. We typically respond to MyChart messages within 1-2 business days.  For prescription refills, please ask your pharmacy to contact our office. Our fax number is [phone].  If you have an urgent issue when the clinic is closed that cannot wait until the next business day, you can page your doctor at the number below.    Please note that while we do our best to be available for urgent issues outside of office hours, we are not available 24/7.   If you have an urgent  issue and are unable to reach us , you may choose to seek medical care at your doctor's office, retail clinic, urgent care center, or emergency room.  If you have a medical emergency, please immediately call 911 or go to the emergency department.  Pager Numbers  - Dr. Hester: [phone]  - Dr. Jackquline: [phone]  - Dr. Claudene: [phone]   - Dr. Raymund: [phone]  In the event of inclement weather, please call our main line at [phone] for an update on the status of any delays or closures.  Dermatology Medication Tips: Please keep the boxes that topical medications come in in order to help keep track of the instructions about where and how to use these. Pharmacies typically print the medication instructions only on the boxes and not directly on the medication tubes.   If your medication is too expensive, please contact our office at [phone] option 4 or send us  a message through MyChart.   We are unable to tell what your co-pay for medications will be in advance as this is different depending on your insurance coverage.  However, we may be able to find a substitute medication at lower cost or fill out paperwork to get insurance to cover a needed medication.   If a prior authorization is required to get your medication covered by your insurance company, please allow us  1-2 business days to complete this process.  Drug prices often vary depending on where the prescription is filled and some pharmacies may offer cheaper prices.  The website www.goodrx.com contains coupons for medications through different pharmacies. The prices here do not account for what the cost may be with help from insurance (it may be cheaper with your insurance), but the website can give you the price if you did not use any insurance.  - You can print the associated coupon and take it with your prescription to the pharmacy.  - You may also stop by our office during regular business hours and pick up a  GoodRx coupon card.  - If you need your prescription sent electronically to a different pharmacy, notify our office through [facility] or by phone at [phone] option 4.     Si Usted Necesita Algo Despus de Su Visita  Tambin puede enviarnos un mensaje a travs de Clinical Cytogeneticist. Por lo general respondemos a los mensajes de MyChart en el transcurso de 1 a 2 das hbiles.  Para renovar recetas, por favor pida a su farmacia que se ponga en contacto con nuestra oficina. Randi lakes de fax es [place] [phone].  Si tiene un asunto urgente cuando la clnica est cerrada y que no puede esperar hasta el siguiente da hbil, puede llamar/localizar a su doctor(a) al nmero que aparece a continuacin.   Por favor, tenga en cuenta que aunque hacemos todo lo posible para estar disponibles para asuntos urgentes fuera del horario de [place], no estamos disponibles las 24 horas del da, los 7 [address]  [address] de la [place].   Si tiene un problema urgente y no puede comunicarse con nosotros, puede optar por buscar atencin mdica  en el consultorio de su doctor(a), en una clnica privada, en un centro de atencin urgente o en una sala de emergencias.  Si tiene engineer, drilling, por favor llame inmediatamente al 911 o vaya a la sala de emergencias.  Nmeros de bper  - Dr. Hester: [phone]  - Dra. Jackquline: [phone]  - Dr. Claudene: [phone]  - Dra. Kitts: [phone]  En caso de inclemencias del [place], por favor llame a nuestra lnea principal al [phone] para una actualizacin sobre el estado de cualquier retraso o cierre.  Consejos para la medicacin en dermatologa: Por favor, guarde las cajas en las que vienen los medicamentos de uso tpico para ayudarle a seguir las instrucciones sobre dnde y cmo usarlos. Las farmacias generalmente imprimen las instrucciones del medicamento slo en las cajas y no directamente en los tubos del [place].   Si su medicamento es muy caro, por  favor, pngase en contacto con landry rieger llamando al [phone] y presione la opcin 4 o envenos un mensaje a travs de Clinical Cytogeneticist.   No podemos decirle cul ser su copago por los medicamentos por adelantado ya que esto es diferente dependiendo de la cobertura de su seguro. Sin embargo, es posible que podamos encontrar un medicamento sustituto a audiological scientist un formulario para que el seguro cubra el medicamento que se considera necesario.   Si se requiere una autorizacin previa para que su compaa de seguros cubra su medicamento, por favor permtanos de 1 a 2 das hbiles para mattel  proceso.  Los precios de los medicamentos varan con frecuencia dependiendo del environmental consultant de dnde se surte la receta y alguna farmacias pueden ofrecer precios ms baratos.  El sitio web www.goodrx.com tiene cupones para medicamentos de health and safety inspector. Los precios aqu no tienen en cuenta lo que podra costar con la ayuda del seguro (puede ser ms barato con su seguro), pero el sitio web puede darle el precio si no utiliz tourist information centre manager.  - Puede imprimir el cupn correspondiente y llevarlo con su receta a la farmacia.  - Tambin puede pasar por nuestra oficina durante el horario de atencin regular y education officer, museum una tarjeta de cupones de GoodRx.  - Si necesita que su receta se enve electrnicamente a una farmacia diferente, informe a nuestra oficina a travs de MyChart de [place] o por telfono llamando al [phone] y presione la opcin 4.

## 2023-12-08 ENCOUNTER — Other Ambulatory Visit: Payer: Self-pay | Admitting: Family Medicine

## 2023-12-08 DIAGNOSIS — E7800 Pure hypercholesterolemia, unspecified: Secondary | ICD-10-CM

## 2023-12-18 ENCOUNTER — Emergency Department

## 2023-12-18 ENCOUNTER — Encounter: Payer: Self-pay | Admitting: *Deleted

## 2023-12-18 ENCOUNTER — Emergency Department
Admission: EM | Admit: 2023-12-18 | Discharge: 2023-12-18 | Disposition: A | Discharge status: Home / Self Care | Attending: Emergency Medicine | Admitting: Emergency Medicine

## 2023-12-18 ENCOUNTER — Other Ambulatory Visit: Payer: Self-pay

## 2023-12-18 DIAGNOSIS — R109 Unspecified abdominal pain: Secondary | ICD-10-CM | POA: Diagnosis not present

## 2023-12-18 DIAGNOSIS — R9431 Abnormal electrocardiogram [ECG] [EKG]: Secondary | ICD-10-CM | POA: Diagnosis not present

## 2023-12-18 DIAGNOSIS — R112 Nausea with vomiting, unspecified: Secondary | ICD-10-CM | POA: Insufficient documentation

## 2023-12-18 DIAGNOSIS — R1012 Left upper quadrant pain: Secondary | ICD-10-CM | POA: Insufficient documentation

## 2023-12-18 DIAGNOSIS — K4090 Unilateral inguinal hernia, without obstruction or gangrene, not specified as recurrent: Secondary | ICD-10-CM | POA: Diagnosis not present

## 2023-12-18 LAB — COMPREHENSIVE METABOLIC PANEL WITH GFR
ALT: 20 U/L (ref 0–44)
AST: 37 U/L (ref 15–41)
Albumin: 4.4 g/dL (ref 3.5–5.0)
Alkaline Phosphatase: 64 U/L (ref 38–126)
Anion gap: 12 (ref 5–15)
BUN: 20 mg/dL (ref 8–23)
CO2: 22 mmol/L (ref 22–32)
Calcium: 9.4 mg/dL (ref 8.9–10.3)
Chloride: 103 mmol/L (ref 98–111)
Creatinine, Ser: 0.64 mg/dL (ref 0.44–1.00)
GFR, Estimated: >60 mL/min (ref >60)
Glucose, Bld: 150 mg/dL — ABNORMAL HIGH (ref 70–99)
Potassium: 4.1 mmol/L (ref 3.5–5.1)
Sodium: 138 mmol/L (ref 135–145)
Total Bilirubin: 0.2 mg/dL (ref 0.0–1.2)
Total Protein: 7.3 g/dL (ref 6.5–8.1)

## 2023-12-18 LAB — URINALYSIS, ROUTINE W REFLEX MICROSCOPIC
Bacteria, UA: NONE SEEN
Bilirubin Urine: NEGATIVE
Glucose, UA: NEGATIVE mg/dL
Ketones, ur: NEGATIVE mg/dL
Leukocytes,Ua: NEGATIVE
Nitrite: NEGATIVE
Protein, ur: 30 mg/dL — AB
Specific Gravity, Urine: 1.029 (ref 1.005–1.030)
pH: 5 (ref 5.0–8.0)

## 2023-12-18 LAB — CBC
HCT: 39.4 % (ref 36.0–46.0)
Hemoglobin: 13.2 g/dL (ref 12.0–15.0)
MCH: 30 pg (ref 26.0–34.0)
MCHC: 33.5 g/dL (ref 30.0–36.0)
MCV: 89.5 fL (ref 80.0–100.0)
Platelets: 244 K/uL (ref 150–400)
RBC: 4.4 MIL/uL (ref 3.87–5.11)
RDW: 11.9 % (ref 11.5–15.5)
WBC: 13.7 K/uL — ABNORMAL HIGH (ref 4.0–10.5)
nRBC: 0 % (ref 0.0–0.2)

## 2023-12-18 LAB — LIPASE, BLOOD: Lipase: 29 U/L (ref 11–51)

## 2023-12-18 MED ORDER — ONDANSETRON 8 MG PO TBDP: 8.0000 mg | ORAL_TABLET | Freq: Three times a day (TID) | ORAL | 0 refills | Status: AC | PRN

## 2023-12-18 MED ORDER — ONDANSETRON HCL 4 MG/2ML IJ SOLN
4.0000 mg | Freq: Once | INTRAMUSCULAR | Status: AC
  Administered 2023-12-18: 4 mg via INTRAVENOUS
  Filled 2023-12-18: qty 2

## 2023-12-18 MED ORDER — LACTATED RINGERS IV BOLUS
1000.0000 mL | Freq: Once | INTRAVENOUS | Status: AC
  Administered 2023-12-18: 1000 mL via INTRAVENOUS

## 2023-12-18 MED ORDER — KETOROLAC TROMETHAMINE 30 MG/ML IJ SOLN
15.0000 mg | Freq: Once | INTRAMUSCULAR | Status: AC
  Administered 2023-12-18: 15 mg via INTRAVENOUS
  Filled 2023-12-18: qty 1

## 2023-12-18 MED ORDER — OXYCODONE-ACETAMINOPHEN 5-325 MG PO TABS: 1.0000 | ORAL_TABLET | ORAL | 0 refills | Status: DC | PRN

## 2023-12-18 NOTE — ED Triage Notes (Addendum)
 Left upper abdominal pain that started about 30 minutes ago. Vomited just PTA. Nothing makes the pain better or worse.

## 2023-12-18 NOTE — ED Provider Notes (Signed)
 [facility] Provider Note   Event Date/Time   First MD Initiated Contact with Patient 12/18/23 [phone]     (approximate) History  Abdominal Pain  HPI Elizabeth Morgan is a 73 y.o. female we the past medical history of hypercholesterolemia and previous tubal ligation who presents complaining of left upper abdominal pain that began approximately 30 minutes prior to arrival with associated nausea and vomiting.  Patient denies any exacerbating or relieving factors.  Patient states that this pain does come in waves with 8/10 being the worst.  Patient denies any history of kidney stones.  Patient's last bowel movement was this morning.  Patient denies any recent travel, sick contacts, or food out of the ordinary ROS: Patient currently denies any vision changes, tinnitus, difficulty speaking, facial droop, sore throat, chest pain, shortness of breath, diarrhea, dysuria, or weakness/numbness/paresthesias in any extremity   Physical Exam  Triage Vital Signs: ED Triage Vitals  Encounter Vitals Group     BP 12/18/23 0226 125/72     Girls Systolic BP Percentile --      Girls Diastolic BP Percentile --      Boys Systolic BP Percentile --      Boys Diastolic BP Percentile --      Pulse Rate 12/18/23 0226 92     Resp 12/18/23 0226 16     Temp 12/18/23 0226 98.8 F (37.1 C)     Temp src --      SpO2 12/18/23 0226 99 %     Weight --      Height --      Head Circumference --      Peak Flow --      Pain Score 12/18/23 0225 8     Pain Loc --      Pain Education --      Exclude from Growth Chart --    Most recent vital signs: Vitals:   12/18/23 0226  BP: 125/72  Pulse: 92  Resp: 16  Temp: 98.8 F (37.1 C)  SpO2: 99%   General: Awake, oriented x4. CV:  Good peripheral perfusion. Resp:  Normal effort. Abd:  No distention.  Nontender to palpation Other:  Elderly, overweight Caucasian female resting comfortably in no acute distress ED Results / Procedures /  Treatments  Labs (all labs ordered are listed, but only abnormal results are displayed) Labs Reviewed  COMPREHENSIVE METABOLIC PANEL WITH GFR - Abnormal; Notable for the following components:      Result Value   Glucose, Bld 150 (*)    All other components within normal limits  CBC - Abnormal; Notable for the following components:   WBC 13.7 (*)    All other components within normal limits  URINALYSIS, ROUTINE W REFLEX MICROSCOPIC - Abnormal; Notable for the following components:   Color, Urine YELLOW (*)    APPearance HAZY (*)    Hgb urine dipstick SMALL (*)    Protein, ur 30 (*)    All other components within normal limits  LIPASE, BLOOD   EKG ED ECG REPORT I, Artist MARLA Kerns, the attending physician, personally viewed and interpreted this ECG. Date: 12/18/2023 EKG Time: 0227 Rate: 89 Rhythm: normal sinus rhythm QRS Axis: normal Intervals: normal ST/T Wave abnormalities: normal Narrative Interpretation: no evidence of acute ischemia RADIOLOGY ED MD interpretation: CT renal stone study does not show any evidence of acute abnormalities - All radiology independently interpreted and agree with radiology assessment Official radiology report(s): CT Renal Stone Study Result  Date: 12/18/2023 EXAM: CT UROGRAM 12/18/2023 04:36:46 AM TECHNIQUE: CT of the abdomen and pelvis was performed without the administration of intravenous contrast as per CT urogram protocol. Multiplanar reformatted images as well as MIP urogram images are provided for review. Automated exposure control, iterative reconstruction, and/or weight based adjustment of the mA/kV was utilized to reduce the radiation dose to as low as reasonably achievable. COMPARISON: CT of the abdomen and pelvis dated 07/28/2006. CLINICAL HISTORY: Abdominal/flank pain, stone suspected. FINDINGS: LOWER CHEST: Minimal atelectasis present independently within the lower lobes bilaterally. LIVER: The liver is unremarkable. GALLBLADDER AND BILE  DUCTS: Gallbladder is unremarkable. No biliary ductal dilatation. SPLEEN: No acute abnormality. PANCREAS: No acute abnormality. ADRENAL GLANDS: No acute abnormality. KIDNEYS, URETERS AND BLADDER: There is no evidence of nephrolithiasis or obstructive uropathy. No hydronephrosis. No perinephric or periureteral stranding. Urinary bladder is unremarkable. GI AND BOWEL: Stomach demonstrates no acute abnormality. There is no bowel obstruction. There are few colonic diverticula present. There is no evidence of diverticulitis. PERITONEUM AND RETROPERITONEUM: No ascites. No free air. VASCULATURE: Aorta is normal in caliber. There is mild calcific atheromatous disease within the abdominal aorta. LYMPH NODES: There are numerous shotty mesenteric lymph nodes. REPRODUCTIVE ORGANS: No acute abnormality. 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 There is a left inguinal fat-containing hernia. IMPRESSION: 1. No nephrolithiasis or obstructive uropathy. 2. No acute intra-abdominal or pelvic abnormality. Electronically signed by: Evalene Coho MD 12/18/2023 05:14 AM EST RP Workstation: [id number]   PROCEDURES: Critical Care performed: No Procedures MEDICATIONS ORDERED IN ED: Medications  ketorolac  (TORADOL ) 30 MG/ML injection 15 mg (15 mg Intravenous Given 12/18/23 0252)  ondansetron  (ZOFRAN ) injection 4 mg (4 mg Intravenous Given 12/18/23 0253)  lactated ringers  bolus 1,000 mL (0 mLs Intravenous Stopped 12/18/23 0401)   IMPRESSION / MDM / ASSESSMENT AND PLAN / ED COURSE  I reviewed the triage vital signs and the nursing notes.                             The patient is on the cardiac monitor to evaluate for evidence of arrhythmia and/or significant heart rate changes. Patient's presentation is most consistent with acute presentation with potential threat to life or bodily function. Patient is a 73 year old female with the above-stated past medical history who presents complaining of  left upper quadrant abdominal pain that radiates down the left side DDx: Kidney stone, splenic infarct, gastroenteritis, bowel obstruction Plan: CBC, CMP, UA UA showing small amount of hemoglobin and therefore will order CT renal stone study  Laboratory radiologic evaluation does not show any evidence of acute abnormalities.  Patient's pain well-controlled with IV analgesia and p.o. tolerant after Zofran .  Expected limited course of patient's symptoms and will provide outpatient prescriptions for pain and nausea control as well as instructions to follow-up with PCP as needed.  Patient agrees with plan and all questions were answered prior to discharge.  Patient given strict return precautions  Dispo: Discharge home with PCP follow-up   FINAL CLINICAL IMPRESSION(S) / ED DIAGNOSES   Final diagnoses:  None   Rx / DC Orders   ED Discharge Orders     None      Note:  This document was prepared using Dragon voice recognition software and may include unintentional dictation errors.   Lynda Capistran K, MD 12/18/23 [phone]

## 2023-12-25 DIAGNOSIS — I509 Heart failure, unspecified: Secondary | ICD-10-CM | POA: Diagnosis not present

## 2023-12-25 DIAGNOSIS — I21A1 Myocardial infarction type 2: Secondary | ICD-10-CM | POA: Diagnosis present

## 2023-12-25 DIAGNOSIS — E7800 Pure hypercholesterolemia, unspecified: Secondary | ICD-10-CM | POA: Diagnosis present

## 2023-12-25 DIAGNOSIS — I371 Nonrheumatic pulmonary valve insufficiency: Secondary | ICD-10-CM | POA: Diagnosis not present

## 2023-12-25 DIAGNOSIS — I5021 Acute systolic (congestive) heart failure: Secondary | ICD-10-CM | POA: Diagnosis present

## 2023-12-25 DIAGNOSIS — I361 Nonrheumatic tricuspid (valve) insufficiency: Secondary | ICD-10-CM | POA: Diagnosis not present

## 2023-12-25 DIAGNOSIS — Z7982 Long term (current) use of aspirin: Secondary | ICD-10-CM | POA: Diagnosis not present

## 2023-12-25 DIAGNOSIS — Z20822 Contact with and (suspected) exposure to covid-19: Secondary | ICD-10-CM | POA: Diagnosis not present

## 2023-12-25 DIAGNOSIS — I340 Nonrheumatic mitral (valve) insufficiency: Secondary | ICD-10-CM | POA: Diagnosis not present

## 2023-12-25 DIAGNOSIS — J90 Pleural effusion, not elsewhere classified: Secondary | ICD-10-CM | POA: Diagnosis not present

## 2023-12-25 DIAGNOSIS — C50919 Malignant neoplasm of unspecified site of unspecified female breast: Secondary | ICD-10-CM | POA: Diagnosis present

## 2023-12-25 DIAGNOSIS — R931 Abnormal findings on diagnostic imaging of heart and coronary circulation: Secondary | ICD-10-CM | POA: Diagnosis not present

## 2023-12-25 DIAGNOSIS — I255 Ischemic cardiomyopathy: Secondary | ICD-10-CM | POA: Diagnosis present

## 2023-12-25 DIAGNOSIS — R079 Chest pain, unspecified: Secondary | ICD-10-CM | POA: Diagnosis not present

## 2023-12-25 DIAGNOSIS — I5181 Takotsubo syndrome: Secondary | ICD-10-CM | POA: Diagnosis present

## 2023-12-25 DIAGNOSIS — Z79899 Other long term (current) drug therapy: Secondary | ICD-10-CM | POA: Diagnosis not present

## 2023-12-25 DIAGNOSIS — I110 Hypertensive heart disease with heart failure: Secondary | ICD-10-CM | POA: Diagnosis present

## 2023-12-25 DIAGNOSIS — K219 Gastro-esophageal reflux disease without esophagitis: Secondary | ICD-10-CM | POA: Diagnosis present

## 2023-12-25 DIAGNOSIS — R7989 Other specified abnormal findings of blood chemistry: Secondary | ICD-10-CM | POA: Diagnosis not present

## 2023-12-25 DIAGNOSIS — Z7983 Long term (current) use of bisphosphonates: Secondary | ICD-10-CM | POA: Diagnosis not present

## 2023-12-25 DIAGNOSIS — Z1152 Encounter for screening for COVID-19: Secondary | ICD-10-CM | POA: Diagnosis not present

## 2023-12-25 DIAGNOSIS — R918 Other nonspecific abnormal finding of lung field: Secondary | ICD-10-CM | POA: Diagnosis not present

## 2023-12-27 NOTE — Progress Notes (Signed)
 Case Management Discharge Note  12/27/23 Patient Name:    Elizabeth Morgan  DOB: 29-Oct-1950    Gender: female  Medical Record Number:  [id number]   Admission Date: 12/25/2023 Discharge Date:   Elizabeth Morgan is a 73 y.o. female admitted with:  Principal Problem:   Acute heart failure, unspecified heart failure type (POA: Yes) Active Problems:   Acid reflux (POA: Yes)   Invasive carcinoma of breast (HCC) (POA: Yes)   Pure hypercholesterolemia (POA: Yes)   Elevated troponin (POA: Yes)   Congestive heart failure (POA: Unknown)   Cardiomyopathy, ischemic (POA: Unknown)    Readmission Risks:  High  Which interventions have been identified by case management to address the patients increased risk for readmission?: Provide health literacy education  Have Planned Readmission Interventions been completed?: Yes  Discharge plan discussed with:  Patient  Please provide details for any case management interventions identified and addressed during this admission:  Patient to discharge home to self care. No cm needs indicated during this admission.   Morna Rubenstein, RN

## 2023-12-27 NOTE — Discharge Summary (Signed)
 [facility] Discharge Summary  Admission date:  12/25/2023  Discharge date:  12/27/2023  Discharge Diagnosis Acute heart failure, unspecified heart failure type Principal Problem:   Acute heart failure, unspecified heart failure type (POA: Yes) Active Problems:   Acid reflux (POA: Yes)   Invasive carcinoma of breast (HCC) (POA: Yes)   Pure hypercholesterolemia (POA: Yes)   Elevated troponin (POA: Yes)   Congestive heart failure (POA: Unknown)   Cardiomyopathy, ischemic (POA: Unknown)   Presenting problem on admission:  Elizabeth Morgan is a 73 y.o. female with basal carcinoma of the breast, hypertension, hyperlipidemia, GERD presents to the ER with chest pain and shortness of breath for the past week.  Patient states that symptoms ongoing for approximately 1 week with the patient also endorsing weight gain.  Patient states that the weight gain is unintentional.  She states that she has noticed her close getting little tighter.  Patient reports no change in her dietary habits.  Patient states that over the past couple months she was changed from anastrozole  to tamoxifen  for her breast cancer.  The patient endorses increasing shortness of breath and difficulty laying flat on her side.  Given concern she went to urgent care and was subsequently referred to the ED for further evaluation.   In the ER initial vitals showed BP 145/68, heart rate 83, T36.6, sat 100% room air.  Initial labs showed bicarb 23 with no other major lites or kidney function or significant liver function abnormalities.  CBC showed normal hemoglobin and platelet count.  White count mildly elevated 11.6.  Initial troponin 0.035.  proBNP elevated 8700 with a negative flu COVID RSV.  UA unremarkable.  Chest x-ray showing pulmonary edema with a small right pleural effusion.  Being admitted to the medical service for suspected acute heart failure.     Hospital Course:  Acute heart failure, unspecified heart failure  type Elevated troponin  -Acute systolic heart - Patient presented with chest pain, BNP Elevated 8710, chest x-ray showed bibasilar opacities that may represent pulmonary edema or atypical infection, small right pleural effusion, echocardiogram revealed an EF at 35 to 40%, extensive anteroseptal and apical hypokinesis, cardiology consulted, patient underwent cardiac catheterization  - Patient started on Toprol, losartan, continue aspirin, rosuvastatin  - Initial troponin 0.035, troponin peak at 0.043, repeat troponins have normalized - Elevated troponin secondary to a type II MI - Likely Takotsubo cardiomyopathy, patient denies any alcohol use, illicit drug use, TSH within normal limits -Follow-up with cardiology outpatient  Congestive heart failure Cardiomyopathy, ischemic - Plan described above - Status post cardiac catheterization  Acid reflux - Continue Protonix 40 mg daily in place of home omeprazole   Invasive carcinoma of breast (HCC) - Resume tamoxifen   Pure hypercholesterolemia - Continue on rosuvastatin  10 mg daily  Consults:  Cardiology  Procedures: Left heart catheterization  Last Results past 24H  CBC w/o Diff   Collection Time: 12/27/23  3:47 AM  Result Value Ref Range   WBC Count 8.40 4.80 - 10.80 10*3/mcL   RBC 3.71 (L) 4.20 - 5.40 10*6/mcL   Hemoglobin 11.0 (L) 12.0 - 16.0 g/dL   Hematocrit 67.1 (L) 64.9 - 47.0 %   MCV 88.4 Males: 80.0-95.0; Females: 80.0-95.0 fL   MCH 29.6 28.0 - 34.0 pg   MCHC 33.5 32.0 - 36.0 g/dL   RDW 87.9 88.4 - 85.4 %   Platelet Count 254 150 - 450 10*3/mcL   Nucleated RBC % Auto 0.0 <=0.0 %   NRBC Absolute 0.00 <=0.00 10*3/mcL  Basic metabolic panel   Collection Time: 12/27/23  3:47 AM  Result Value Ref Range   Sodium 132 (L) 135 - 145 mmol/L   Potassium 4.1 3.5 - 5.0 mmol/L   Chloride 102 98 - 109 mmol/L   CO2 27 24 - 29 mmol/L   BUN 18 (H) 7 - 17 mg/dL   Glucose 899 65 - 894 mg/dL   Creatinine 9.26 9.47 - 1.04 mg/dL    Calcium  8.5 8.4 - 10.2 mg/dL   Anion Gap 3 3 - 11   Osmolality Calc 276 (L) 280 - 297 mosm/kg   BUN/Creatinine Ratio 24.7 ratio   eGFR 88 mL/min/1.2m*2  Magnesium Level   Collection Time: 12/27/23  3:47 AM  Result Value Ref Range   Magnesium 2.0 1.6 - 2.3 mg/dL  Thyroid  Stimulating Hormone   Collection Time: 12/27/23  3:47 AM  Result Value Ref Range   Thyroid  Stimulating Hormone 0.760 0.465 - 4.680 mIU/L   Transthoracic echo (TTE) complete Result Date: 12/26/2023   1.  Nondilated left ventricle with normal wall thickness.  There is extensive anteroseptal and apical hypokinesis.  Overall systolic function is moderately reduced with an ejection fraction estimated at 35 to 40%.   2.  Normal diastolic function.   3.  No significant valvular abnormalities.   XR chest 1 view Result Date: 12/25/2023 Impression  1. Perivascular opacities can represent pulmonary edema or atypical infection. 2. Small right pleural effusion. SIGNATURE: Electronically Signed   By: Craige Ruth M.D.   On: 12/25/2023 17:43   WSID: [id number]     Physical Exam At Time of Discharge: Temp: 36.8 C (98.2 F) (12/09 1052) Temp Source: Oral (12/09 1052) Heart Rate: 81 (12/09 1311) Resp: 25 (12/09 1130) BP: 157/98 (12/09 1311) SpO2: 95 % (12/09 1311) MAP (mmHg): 118 (12/09 1311)  Constitutional:Alert,well nourished,well-developed,  NAD _ Head: NCAT Eye: EOMI, PERRL, anicteric sclera Neck: Supple,no JVD , no thyromegaly or lymphadenopathy ENT:  Mucous membranes  moist , oropharynx clear_ Cardiovascular:  RRR ,  normal S1, S2 Respiratory:   CTA B/L , no stridor_  Gastrointestinal: Soft,  nontender , non-distended,normal bowel sounds No hepatosplenomegaly Genitourinary:  Deferred,_ Musculoskeletal:  Distal pulses normal,no injury or deformity Extremities: no edema no cyanosis, no clubbing Neurological: Alert, Oriented to person, place and time, Nonfocal, sensation intact  throughout. Behavior/Emotional:Appropriate Cooperative _ _ Skin: Warm, dry, no rash  Discharge medications:     Medication List     TAKE these medications        Morning Around Noon Evening Bedtime As Needed   alendronate  70 mg tablet Commonly known as: Fosamax  Take 70 mg by mouth every 7 (seven) days. Last Hospital Dose: Ask about last dose    Take 70 mg by mouth every 7 (seven) days.   * aspirin 81 mg chewable tablet Start taking on: December 28, 2023 Chew 1 tablet (81 mg) in the morning. Last Hospital Dose: 81 mg on December 27, 2023 10:24 AM  * Wait until Dec 10   1 tablet       * furosemide 40 mg tablet Commonly known as: Lasix Start taking on: December 28, 2023 Take 1 tablet (40 mg) by mouth in the morning. Last Hospital Dose: Ask your nurse or doctor  * Wait until Dec 10   1 tablet       * losartan 25 mg tablet Commonly known as: Cozaar Take 1 tablet (25 mg) by mouth in the morning. Last Hospital Dose: Ask about last dose  *  1 tablet       * metoprolol succinate XL 25 mg 24 hr tablet Commonly known as: Toprol-XL Start taking on: December 28, 2023 Take 1 tablet (25 mg) by mouth in the morning. Do not crush or chew. Last Hospital Dose: 25 mg on December 26, 2023 11:08 AM  * Wait until Dec 10   1 tablet        omeprazole  40 mg DR capsule Commonly known as: PriLOSEC Take 40 mg by mouth before breakfast. Last Hospital Dose: Ask about last dose  * 40 mg        rosuvastatin  10 mg tablet Commonly known as: Crestor  Take 10 mg by mouth in the morning. Last Hospital Dose: 10 mg on December 26, 2023  8:13 AM  * 10 mg        tamoxifen  20 mg chemo tablet Commonly known as: Nolvadex  Take 20 mg by mouth in the morning. Last Hospital Dose: Ask about last dose  * 20 mg             Current Discharge Medication List       Outpatient Follow-Up Future Appointments  Date Time Provider Department Center  01/10/2024  1:30 PM Mannie Norris, FNP  [facility] [place]     Discharge disposition:  Home  Discharge condition:  Stable  Time spent on discharge:  Greater than 35 minutes were spent coordinating this discharge, including time spent counseling patient on discharge diagnoses and recommended follow ups, collaborating with case management, chart review and medication reconciliation.  *Some images could not be shown.

## 2023-12-27 NOTE — Nursing Note (Signed)
 Patient received discharge instructions, denies questions. PIV x1 removed tip intact. Tele removed and returned to nurses station. No signs of distress upon discharge. Visitor at bedside assisting with her dressing, and packing.

## 2023-12-28 ENCOUNTER — Telehealth: Payer: Self-pay

## 2023-12-28 ENCOUNTER — Other Ambulatory Visit: Payer: Self-pay

## 2023-12-28 NOTE — Patient Instructions (Addendum)
 Visit Information  Thank you for taking time to visit with me today. Please don't hesitate to contact me if I can be of assistance to you before our next scheduled telephone appointment.  Our next appointment is by telephone on 01/04/24 at 11:00 AM  Following is a copy of your care plan:   Goals Addressed             This Visit's Progress    VBCI Transitions of Care (TOC) Care Plan       Problems:  Recent Hospitalization for treatment of CHF Knowledge Deficit Related to new Congestive Heart Failure and No Hospital Follow Up Provider appointment scheduled with PCP - was able to schedule with PCP today for 01/03/24   Goal:  Over the next 30 days, the patient will not experience hospital readmission  Interventions:  Transitions of Care: Doctor Visits  - discussed the importance of doctor visits Reviewed new medications and uses and importance of home monitoring with weights and BPs.  Heart Failure Interventions: Basic overview and discussion of pathophysiology of Heart Failure reviewed Provided education on low sodium diet Assessed need for readable accurate scales in home Provided education about placing scale on hard, flat surface Advised patient to weigh each morning after emptying bladder Discussed importance of daily weight and advised patient to weigh and record daily Reviewed role of diuretics in prevention of fluid overload and management of heart failure; Discussed the importance of keeping all appointments with provider Provided patient with education about the role of exercise in the management of heart failure Screening for signs and symptoms of depression related to chronic disease state  Assessed social determinant of health barriers  Reviewed the importance of resting and safety measures when getting up from lying to standing. Reviewed role of diuretics and monitoring weight daily.  Patient Self Care Activities:  Attend all scheduled provider appointments Call  pharmacy for medication refills 3-7 days in advance of running out of medications Call provider office for new concerns or questions  Notify RN Care Manager of TOC call rescheduling needs Participate in Transition of Care Program/Attend TOC scheduled calls Take medications as prescribed    Plan:  Telephone follow up appointment with care management team member scheduled for:  1 week 01/04/24 The care management team will reach out to the patient again over the next 5- 10 business days. The patient has been provided with contact information for the care management team and has been advised to call with any health related questions or concerns.  Discussed and offered 30 day TOC program.  Patient agrees.  The patient has been provided with contact information for the care management team and has been advised to call with any health -related questions or concerns.  The patient verbalized understanding with current plan of care.  The patient is directed to their insurance card regarding availability of benefits coverage.          Patient verbalizes understanding of instructions and care plan provided today and agrees to view in MyChart. Active MyChart status and patient understanding of how to access instructions and care plan via MyChart confirmed with patient.     The patient has been provided with contact information for the care management team and has been advised to call with any health related questions or concerns.   Please call the care guide team at [phone] if you need to cancel or reschedule your appointment.   Please call the [place]  National Suicide Prevention Lifeline: (678) 475-2708 or TTY:  (337) 290-9416 TTY (984)713-4509) to talk to a trained counselor if you are experiencing a Mental Health or Behavioral Health Crisis or need someone to talk to.  Richerd Fish, RN, BSN, CCM [facility], [facility] Management Coordinator Direct Dial:  [phone]

## 2023-12-28 NOTE — Transitions of Care (Post Inpatient/ED Visit) (Signed)
 12/29/2023  Name: Elizabeth Morgan MRN: [id number] DOB: 1950/07/05  Today's TOC FU Call Status: Today's TOC FU Call Status:: Successful TOC FU Call Completed TOC FU Call Complete Date: 12/28/23  Patient's Name and Date of Birth confirmed. Name, DOB  Transition Care Management Follow-up Telephone Call Date of Discharge: 12/27/23 Discharge Facility: Other [facility]) Name of Other (Non[facility]) Discharge Facility: [facility] Type of Discharge: Inpatient Admission Primary Inpatient Discharge Diagnosis:: CHF - Congestive Heart Failure How have you been since you were released from the hospital?: Better Any questions or concerns?: No  Items Reviewed: Did you receive and understand the discharge instructions provided?: Yes Medications obtained,verified, and reconciled?: Yes (Medications Reviewed) Any new allergies since your discharge?: No Dietary orders reviewed?: Yes Type of Diet Ordered:: low sodium heart healthy Do you have support at home?: Yes People in Home [RPT]: spouse Name of Support/Comfort Primary Source: Elizabeth Morgan  Medications Reviewed Today: Medications Reviewed Today     Reviewed by Elizabeth Richerd GRADE, RN (Registered Nurse) on 12/28/23 at 1010  Med List Status: <None>   Medication Order Taking? Sig Documenting Provider Last Dose Status Informant  alendronate  (FOSAMAX ) 70 MG tablet [id number] Yes Take 1 tablet (70 mg total) by mouth once a week. Take with a full glass of water on an empty stomach. Elizabeth Annah BROCKS, MD  Active   Calcium  Carbonate-Vitamin D 600-400 MG-UNIT tablet [id number] Yes Take 2 tablets by mouth daily. [provider]  Active            Med Note Elizabeth Morgan   Wed Mar 30, 2023  9:28 AM) 1 tablet daily  cetirizine (ZYRTEC) 10 MG tablet [id number] Yes Take 10 mg by mouth daily as needed (allergies).  [provider]  Active   fluticasone (FLONASE) 50 MCG/ACT nasal spray [id number] Yes Place 1 spray into both nostrils  daily as needed for allergies or rhinitis. [provider]  Active   meclizine  (ANTIVERT ) 12.5 MG tablet [id number] Yes Take 1 tablet (12.5 mg total) by mouth 3 (three) times daily as needed for dizziness. Simmons-Robinson, Makiera, MD  Active   omeprazole  (PRILOSEC) 40 MG capsule [id number] Yes TAKE 1 CAPSULE (40 MG TOTAL) BY MOUTH DAILY. Simmons-Robinson, Rockie, MD  Active   ondansetron  (ZOFRAN -ODT) 8 MG disintegrating tablet [id number] Yes Take 1 tablet (8 mg total) by mouth every 8 (eight) hours as needed for nausea or vomiting. Bradler, Elizabeth K, MD  Active   oxyCODONE -acetaminophen  (PERCOCET) 5-325 MG tablet [id number] Yes Take 1 tablet by mouth every 4 (four) hours as needed for severe pain (pain score 7-10). Bradler, Elizabeth K, MD  Active   rosuvastatin  (CRESTOR ) 10 MG tablet [id number] Yes Take 1 tablet (10 mg total) by mouth daily. Simmons-Robinson, Makiera, MD  Active   tamoxifen  (NOLVADEX ) 20 MG tablet [id number] Yes Take 1 tablet (20 mg total) by mouth daily. Elizabeth Annah BROCKS, MD  Active   tiZANidine  (ZANAFLEX ) 4 MG tablet [id number] Yes Take 1 tablet (4 mg total) by mouth every 6 (six) hours as needed for muscle spasms. Simmons-Robinson, Makiera, MD  Active   triamcinolone  cream (KENALOG ) 0.1 % [id number]  Apply to rash on ankle BID up to 2 weeks PRN. Avoid applying to face, groin, and axilla. Morgan as directed. Elizabeth Morgan can cause thinning of the skin.  Patient not taking: Reported on 12/28/2023   Elizabeth Sawyer, MD  Active             Home Care and Equipment/Supplies: Were Home Health  Services Ordered?: No Any new equipment or medical supplies ordered?: No  Functional Questionnaire: Do you need assistance with bathing/showering or dressing?: No Do you need assistance with meal preparation?: No Do you need assistance with eating?: No Do you have difficulty maintaining continence: No Do you need assistance with getting out of bed/getting out of a chair/moving?: No Do you have  difficulty managing or taking your medications?: No  Follow up appointments reviewed: PCP Follow-up appointment confirmed?: No (This writer arranged hospital follow up for 01/03/24 at 10:00 am with .pcp) MD Provider Line Number:[phone] Given: No Do you need transportation to your follow-up appointment?: No Do you understand care options if your condition(s) worsen?: Yes-patient verbalized understanding  SDOH Interventions Today    Flowsheet Row Most Recent Value  SDOH Interventions   Food Insecurity Interventions Intervention Not Indicated  Housing Interventions Intervention Not Indicated  Transportation Interventions Intervention Not Indicated  Utilities Interventions Intervention Not Indicated    Goals Addressed             This Visit's Progress    VBCI Transitions of Care (TOC) Care Plan       Problems:  Recent Hospitalization for treatment of CHF Knowledge Deficit Related to new Congestive Heart Failure and No Hospital Follow Up Provider appointment scheduled with PCP - was able to schedule with PCP today for 01/03/24   Goal:  Over the next 30 days, the patient will not experience hospital readmission  Interventions:  Transitions of Care: Doctor Visits  - discussed the importance of doctor visits Reviewed new medications and uses and importance of home monitoring with weights and BPs.  Heart Failure Interventions: Basic overview and discussion of pathophysiology of Heart Failure reviewed Provided education on low sodium diet Assessed need for readable accurate scales in home Provided education about placing scale on hard, flat surface Advised patient to weigh each morning after emptying bladder Discussed importance of daily weight and advised patient to weigh and record daily Reviewed role of diuretics in prevention of fluid overload and management of heart failure; Discussed the importance of keeping all appointments with provider Provided patient with  education about the role of exercise in the management of heart failure Screening for signs and symptoms of depression related to chronic disease state  Assessed social determinant of health barriers  Reviewed the importance of resting and safety measures when getting up from lying to standing. Reviewed role of diuretics and monitoring weight daily.  Patient Self Care Activities:  Attend all scheduled provider appointments Call pharmacy for medication refills 3-7 days in advance of running out of medications Call provider office for new concerns or questions  Notify RN Care Manager of TOC call rescheduling needs Participate in Transition of Care Program/Attend TOC scheduled calls Take medications as prescribed    Plan:  Telephone follow up appointment with care management team member scheduled for:  1 week 01/04/24 The care management team will reach out to the patient again over the next 5- 10 business days. The patient has been provided with contact information for the care management team and has been advised to call with any health related questions or concerns.  Discussed and offered 30 day TOC program.  Patient agrees.  The patient has been provided with contact information for the care management team and has been advised to call with any health -related questions or concerns.  The patient verbalized understanding with current plan of care.  The patient is directed to their insurance card regarding availability of  benefits coverage.          Richerd Fish, RN, BSN, CCM [facility], [facility] Management Coordinator Direct Dial: [phone]

## 2023-12-28 NOTE — Transitions of Care (Post Inpatient/ED Visit) (Signed)
° °  12/28/2023  Name: Elizabeth Morgan MRN: [id number] DOB: [birth year]  Today's TOC FU Call Status: Today's TOC FU Call Status:: Unsuccessful Call (1st Attempt)  Attempted to reach the patient regarding the most recent Inpatient/ED visit.  Left a HIPAA approved voicemail message to phone number provided in demographics per DPR.    Follow Up Plan: Additional outreach attempts will be made to reach the patient to complete the Transitions of Care (Post Inpatient/ED visit) call.   Richerd Fish, RN, BSN, CCM [facility], [facility] Management Coordinator Direct Dial: [phone]

## 2024-01-03 ENCOUNTER — Ambulatory Visit (INDEPENDENT_AMBULATORY_CARE_PROVIDER_SITE_OTHER): Admitting: Family Medicine

## 2024-01-03 ENCOUNTER — Encounter: Payer: Self-pay | Admitting: Family Medicine

## 2024-01-03 VITALS — BP 128/57 | HR 76 | Temp 98.8°F | Ht 63.0 in | Wt 159.5 lb

## 2024-01-03 DIAGNOSIS — E7800 Pure hypercholesterolemia, unspecified: Secondary | ICD-10-CM

## 2024-01-03 DIAGNOSIS — I5021 Acute systolic (congestive) heart failure: Secondary | ICD-10-CM

## 2024-01-03 DIAGNOSIS — C50919 Malignant neoplasm of unspecified site of unspecified female breast: Secondary | ICD-10-CM

## 2024-01-03 DIAGNOSIS — E049 Nontoxic goiter, unspecified: Secondary | ICD-10-CM

## 2024-01-03 DIAGNOSIS — Z09 Encounter for follow-up examination after completed treatment for conditions other than malignant neoplasm: Secondary | ICD-10-CM | POA: Diagnosis not present

## 2024-01-03 DIAGNOSIS — Z853 Personal history of malignant neoplasm of breast: Secondary | ICD-10-CM

## 2024-01-03 DIAGNOSIS — R051 Acute cough: Secondary | ICD-10-CM | POA: Diagnosis not present

## 2024-01-03 DIAGNOSIS — R0981 Nasal congestion: Secondary | ICD-10-CM

## 2024-01-03 DIAGNOSIS — I21A1 Myocardial infarction type 2: Secondary | ICD-10-CM | POA: Diagnosis not present

## 2024-01-03 LAB — POC COVID19 BINAXNOW: SARS Coronavirus 2 Ag: NEGATIVE

## 2024-01-03 LAB — POCT INFLUENZA A/B
Influenza A, POC: NEGATIVE
Influenza B, POC: NEGATIVE

## 2024-01-03 MED ORDER — BENZONATATE 100 MG PO CAPS: 100.0000 mg | ORAL_CAPSULE | Freq: Two times a day (BID) | ORAL | 0 refills | Status: AC | PRN

## 2024-01-03 MED ORDER — PREDNISONE 20 MG PO TABS: ORAL_TABLET | ORAL | 0 refills | Status: AC

## 2024-01-03 NOTE — Progress Notes (Signed)
 Established patient visit   Patient: Elizabeth Morgan   DOB: 08-08-1950   73 y.o. Female  MRN: [id number] Visit Date: 01/03/2024  Today's healthcare provider: Rockie Agent, MD   Chief Complaint  Patient presents with   Follow-up    Patient is present for HFU 12/7-12/9 dx HF. Patient is feeling okay, patient is seeing cardiology on 23rd for f/u US . May want to est cardiologist here but unsure.    URI    X 2-3 days, sinus congestion and pressure, cough, denies body aches fever or chills    Subjective     HPI     Follow-up    Additional comments: Patient is present for HFU 12/7-12/9 dx HF. Patient is feeling okay, patient is seeing cardiology on 23rd for f/u US . May want to est cardiologist here but unsure.         URI    Additional comments: X 2-3 days, sinus congestion and pressure, cough, denies body aches fever or chills       Last edited by Cherry Chiquita HERO, CMA on 01/03/2024 10:16 AM.       Discussed the use of AI scribe software for clinical note transcription with the patient, who gave verbal consent to proceed.  History of Present Illness Elizabeth Morgan is a 73 year old female with heart failure and cardiomyopathy who presents for a hospital follow-up after recent admission for chest pain.  She was admitted to the hospital from December 7th to December 9th due to chest pain and elevated BNP levels at 8710. During her hospital stay, she was diagnosed with heart failure and cardiomyopathy. Her ejection fraction was noted to be between 35% and 40%. She underwent a heart catheterization which showed normal coronary arteries and moderately elevated left ventricular end-diastolic pressure. Her troponin levels were slightly elevated, and she was told this may have been a type two myocardial infarction secondary to heart failure. She was discharged with new medications including Lasix 40 mg daily, aspirin 81 mg daily, losartan 25 mg daily,  metoprolol 25 mg daily, and Fosamax  70 mg weekly.  Since her discharge, she no longer experiences shortness of breath and is able to climb stairs without difficulty. She lost ten pounds during her hospital stay, which she attributes to fluid loss. She expresses surprise at the sudden onset of her heart condition, stating she had no prior heart issues.  She is currently experiencing sinus congestion, pressure, and a cough for the past two to three days, but denies body aches, fevers, or chills. The cough is nonproductive and she denies sore throat or ear pain. She experienced diarrhea on Friday, Sunday, and yesterday, which she suspects may be related to her medication changes. Transitions of care call completed 12/28/23  Patient here for hospital follow up for heart failure     Past Medical History:  Diagnosis Date   Actinic keratosis    Allergy    Breast cancer (HCC) 06/11/2019   DCIS   Cancer (HCC)    left breast   Family history of breast cancer    Family history of lung cancer    Hearing loss    Hernia, abdominal    showed up end of April 2018   History of herpes zoster 11/05/2015   Seasonal allergies    Squamous cell carcinoma in situ (SCCIS) 10/07/2020   left lateral zygoma Moh's 12/01/2020   Squamous cell carcinoma in situ (SCCIS) 10/07/2020   upper mid forehead,  MOHs completed 12/15/20   Squamous cell carcinoma of skin 10/07/2020   L lat zygoma - SCCIS   Squamous cell carcinoma of skin 10/07/2020   upper mid forehead - SCCIS    Medications: Show/hide medication list[1]  Review of Systems  Last metabolic panel Lab Results  Component Value Date   GLUCOSE 150 (H) 12/18/2023   NA 138 12/18/2023   K 4.1 12/18/2023   CL 103 12/18/2023   CO2 22 12/18/2023   BUN 20 12/18/2023   CREATININE 0.64 12/18/2023   GFRNONAA >60 12/18/2023   CALCIUM  9.4 12/18/2023   PROT 7.3 12/18/2023   ALBUMIN 4.4 12/18/2023   LABGLOB 2.1 04/27/2023   AGRATIO 2.0 04/06/2022   BILITOT  0.2 12/18/2023   ALKPHOS 64 12/18/2023   AST 37 12/18/2023   ALT 20 12/18/2023   ANIONGAP 12 12/18/2023   Last lipids Lab Results  Component Value Date   CHOL 172 11/02/2022   HDL 71 11/02/2022   LDLCALC 87 11/02/2022   TRIG 72 11/02/2022   CHOLHDL 2.4 11/02/2022   Last hemoglobin A1c No results found for: HGBA1C Last thyroid  functions Lab Results  Component Value Date   TSH 1.100 04/27/2023   FREET4 1.34 04/27/2023        Objective    BP (!) 128/57 (BP Location: Right Arm, Patient Position: Sitting, Cuff Size: Normal)   Pulse 76   Temp 98.8 F (37.1 C) (Oral)   Ht 5' 3 (1.6 m)   Wt 159 lb 8 oz (72.3 kg)   SpO2 99%   BMI 28.25 kg/m  BP Readings from Last 3 Encounters:  01/03/24 (!) 128/57  12/18/23 125/72  11/02/23 (!) 127/56   Wt Readings from Last 3 Encounters:  01/03/24 159 lb 8 oz (72.3 kg)  11/02/23 165 lb 12.8 oz (75.2 kg)  05/10/23 160 lb (72.6 kg)        Physical Exam  Physical Exam VITALS: BP- 128/57 HEENT: Nasal passages without congestion. Throat normal. Tympanic membranes normal bilaterally. NECK: Tenderness along right anterior cervical chain. Thyroid  medley, nontender, no distinct nodules. CHEST: Clear to auscultation bilaterally, no wheezes, rhonchi, or crackles.    Results for orders placed or performed in visit on 01/03/24  POC COVID-19 BinaxNow  Result Value Ref Range   SARS Coronavirus 2 Ag Negative Negative  POCT Influenza A/B  Result Value Ref Range   Influenza A, POC Negative Negative   Influenza B, POC Negative Negative    Assessment & Plan     Problem List Items Addressed This Visit     Acute systolic heart failure (HCC) - Primary   Relevant Medications   metoprolol succinate (TOPROL-XL) 25 MG 24 hr tablet   losartan (COZAAR) 25 MG tablet   furosemide (LASIX) 40 MG tablet   aspirin 81 MG chewable tablet   Invasive carcinoma of breast (HCC)   Relevant Medications   aspirin 81 MG chewable tablet   predniSONE   (DELTASONE ) 20 MG tablet   Pure hypercholesterolemia   Relevant Medications   metoprolol succinate (TOPROL-XL) 25 MG 24 hr tablet   losartan (COZAAR) 25 MG tablet   furosemide (LASIX) 40 MG tablet   aspirin 81 MG chewable tablet   Type 2 myocardial infarction (HCC)   Relevant Medications   metoprolol succinate (TOPROL-XL) 25 MG 24 hr tablet   losartan (COZAAR) 25 MG tablet   furosemide (LASIX) 40 MG tablet   aspirin 81 MG chewable tablet   Other Visit Diagnoses  Hospital discharge follow-up         Sinus congestion       Relevant Orders   POC COVID-19 BinaxNow (Completed)   POCT Influenza A/B (Completed)     Acute cough       Relevant Medications   predniSONE  (DELTASONE ) 20 MG tablet   benzonatate  (TESSALON ) 100 MG capsule   Other Relevant Orders   POC COVID-19 BinaxNow (Completed)   POCT Influenza A/B (Completed)     Enlarged thyroid  gland       Relevant Medications   metoprolol succinate (TOPROL-XL) 25 MG 24 hr tablet   Other Relevant Orders   US  THYROID        Assessment and Plan Assessment & Plan Acute systolic heart failure with reduced ejection fraction New diagnosis following hospitalization. Ejection fraction 35-40%. No coronary artery disease on heart catheterization. Managed with Lasix, aspirin, losartan, and metoprolol. No current shortness of breath or dyspnea on exertion. Weight loss of 10 pounds post-hospitalization. - Continue Lasix 40 mg daily - Continue aspirin 81 mg daily - Continue losartan 25 mg daily - Continue metoprolol 25 mg daily - Follow up with cardiology on December 23rd  Type 2 myocardial infarction secondary to heart failure Chronic  Type 2 myocardial infarction due to heart failure. Troponin levels elevated but managed with current medications. - Continue current cardiac medications as prescribed  Acute upper respiratory infection Symptoms of sinus congestion, pressure, and nonproductive cough for 2-3 days. COVID, influenza A,  and B tests negative. No fever, body aches, or chills. Symptoms improving. No crackles or wheezing on lung exam. Tenderness along right anterior cervical chain. Thyroid  gland appears enlarged but non-tender. - Prescribed prednisone  40 mg for 3 days, then 20 mg for 2 days - Prescribed Tessalon  Perles 100 mg every 12 hours as needed for cough - Ordered thyroid  ultrasound due to enlarged thyroid  gland - Advised on TLC measures: frequent hand washing, wearing masks, disinfecting shared spaces, warm teas, and honey for cough  Thyroid  gland enlargement New finding Thyroid  gland appears enlarged but non-tender. No distinct nodules palpated. TSH level normal at 0.76 on December 9th. - Ordered thyroid  ultrasound  History of invasive carcinoma of the breast Invasive carcinoma of the breast, status post lumpectomy. Last mammogram in June 2025 was negative. Recommended to return to annual mammogram screenings. - Scheduled annual mammogram screenings   Return in about 3 months (around 04/02/2024) for Chronic F/U, HF, HLD.      Rockie Agent, MD  [facility] [phone] (phone) [phone] (fax)  [facility]     [1]  Outpatient Medications Prior to Visit  Medication Sig   alendronate  (FOSAMAX ) 70 MG tablet Take 1 tablet (70 mg total) by mouth once a week. Take with a full glass of water on an empty stomach.   aspirin 81 MG chewable tablet Chew 81 mg by mouth daily.   Calcium  Carbonate-Vitamin D 600-400 MG-UNIT tablet Take 2 tablets by mouth daily.   cetirizine (ZYRTEC) 10 MG tablet Take 10 mg by mouth daily as needed (allergies).    fluticasone (FLONASE) 50 MCG/ACT nasal spray Place 1 spray into both nostrils daily as needed for allergies or rhinitis.   furosemide (LASIX) 40 MG tablet Take 40 mg by mouth daily.   losartan (COZAAR) 25 MG tablet Take 25 mg by mouth daily.   meclizine  (ANTIVERT ) 12.5 MG tablet Take 1 tablet (12.5 mg total) by  mouth 3 (three) times daily as needed for dizziness.  metoprolol succinate (TOPROL-XL) 25 MG 24 hr tablet Take 25 mg by mouth.   omeprazole  (PRILOSEC) 40 MG capsule TAKE 1 CAPSULE (40 MG TOTAL) BY MOUTH DAILY.   ondansetron  (ZOFRAN -ODT) 8 MG disintegrating tablet Take 1 tablet (8 mg total) by mouth every 8 (eight) hours as needed for nausea or vomiting.   rosuvastatin  (CRESTOR ) 10 MG tablet Take 1 tablet (10 mg total) by mouth daily.   tamoxifen  (NOLVADEX ) 20 MG tablet Take 1 tablet (20 mg total) by mouth daily.   tiZANidine  (ZANAFLEX ) 4 MG tablet Take 1 tablet (4 mg total) by mouth every 6 (six) hours as needed for muscle spasms.   [DISCONTINUED] oxyCODONE -acetaminophen  (PERCOCET) 5-325 MG tablet Take 1 tablet by mouth every 4 (four) hours as needed for severe pain (pain score 7-10).   [DISCONTINUED] triamcinolone  cream (KENALOG ) 0.1 % Apply to rash on ankle BID up to 2 weeks PRN. Avoid applying to face, groin, and axilla. Use as directed. Long-term use can cause thinning of the skin. (Patient not taking: Reported on 12/28/2023)   No facility-administered medications prior to visit.

## 2024-01-03 NOTE — Patient Instructions (Signed)
°  VISIT SUMMARY: You had a follow-up visit after your recent hospital stay for chest pain, where you were diagnosed with heart failure and cardiomyopathy. You are currently managing well with your medications and have no shortness of breath. You also have a new upper respiratory infection and an enlarged thyroid  gland that will be further evaluated.  YOUR PLAN: ACUTE SYSTOLIC HEART FAILURE WITH REDUCED EJECTION FRACTION: You have heart failure with an ejection fraction of 35-40%, but no coronary artery disease. -Continue taking Lasix 40 mg daily. -Continue taking aspirin 81 mg daily. -Continue taking losartan 25 mg daily. -Continue taking metoprolol 25 mg daily. -Follow up with cardiology on December 23rd.  TYPE 2 MYOCARDIAL INFARCTION SECONDARY TO HEART FAILURE: You had a type 2 myocardial infarction due to heart failure, but it is being managed with your current medications. -Continue taking your current cardiac medications as prescribed.  ACUTE UPPER RESPIRATORY INFECTION: You have sinus congestion, pressure, and a nonproductive cough, but no fever or body aches. Your symptoms are improving. -Take prednisone  40 mg for 3 days, then 20 mg for 2 days. -Take Tessalon  Perles 100 mg every 12 hours as needed for cough. -Follow TLC measures: frequent hand washing, wearing masks, disinfecting shared spaces, warm teas, and honey for cough.  THYROID  GLAND ENLARGEMENT: Your thyroid  gland appears enlarged but is not tender. Your TSH level was normal. -Get a thyroid  ultrasound as ordered.      To keep you healthy, please keep in mind the following health maintenance items that you are due for:   Health Maintenance Due  Topic Date Due   Zoster Vaccines- Shingrix (1 of 2) Never done   Influenza Vaccine  Never done   COVID-19 Vaccine (4 - 2025-26 season) 09/19/2023     Best Wishes,   Dr. Lang

## 2024-01-04 ENCOUNTER — Telehealth: Payer: Self-pay

## 2024-01-04 ENCOUNTER — Other Ambulatory Visit: Payer: Self-pay

## 2024-01-04 NOTE — Patient Instructions (Addendum)
 Visit Information  Thank you for taking time to visit with me today. Please don't hesitate to contact me if I can be of assistance to you before our next scheduled telephone appointment.  Our next appointment is by telephone on 01/13/24 at 11:00 AM  Following is a copy of your care plan:   Goals Addressed             This Visit's Progress    VBCI Transitions of Care (TOC) Care Plan   On track    Problems:  Recent Hospitalization for treatment of CHF Knowledge Deficit Related to new Congestive Heart Failure and No Hospital Follow Up Provider appointment scheduled with PCP - was able to schedule with PCP today for 01/03/24  01/04/24 Got a cold medications started by PCP 01/10/24 Cardiology follow up at [facility] [place] [place], states she feel like keeping her appointments there as [place] and [place] are [place] the beach quite often and PCP can assist if a cardiologist is needed here. Reminded about ongoing BP checks, explained about medications that can change BPs.  Goal:  Over the next 30 days, the patient will not experience hospital readmission  Interventions:  Transitions of Care: Doctor Visits  - discussed the importance of doctor visits Reviewed new medications and uses and importance of home monitoring with weights and BPs.  Heart Failure Interventions: Basic overview and discussion of pathophysiology of Heart Failure reviewed Provided education on low sodium diet Assessed need for readable accurate scales in home Provided education about placing scale on hard, flat surface Advised patient to weigh each morning after emptying bladder Discussed importance of daily weight and advised patient to weigh and record daily Reviewed role of diuretics in prevention of fluid overload and management of heart failure; Discussed the importance of keeping all appointments with provider Provided patient with education about the role of exercise in the management of heart  failure Screening for signs and symptoms of depression related to chronic disease state  Assessed social determinant of health barriers  Reviewed the importance of resting and safety measures when getting up from lying to standing. Reviewed role of diuretics and monitoring weight daily.  Patient Self Care Activities:  Attend all scheduled provider appointments Call pharmacy for medication refills 3-7 days in advance of running out of medications Call provider office for new concerns or questions  Notify RN Care Manager of TOC call rescheduling needs Participate in Transition of Care Program/Attend TOC scheduled calls Take medications as prescribed    Plan:  Telephone follow up appointment with care management team member scheduled for:  1 week 01/04/24 The care management team will reach out to the patient again over the next 5- 10 business days. The patient has been provided with contact information for the care management team and has been advised to call with any health related questions or concerns.  Discussed and offered 30 day TOC program.  Patient agrees.  The patient has been provided with contact information for the care management team and has been advised to call with any health -related questions or concerns.  The patient verbalized understanding with current plan of care.  The patient is directed to their insurance card regarding availability of benefits coverage.   01/04/24 Reviewed AVS from MD visit and patient is for a US  Thyroid  and made her aware of that. She will follow up with PCP if she doesn't hear back for this appointment in the next week.  [place]  [place], planning to keep Cardiology at [facility] since they go to the beach frequently. She states PCP can assist in finding cardiology in her area. Also, reminded that the cardiologist there can also refer and give specific information from them.  Patient knows and able to  self-manage appointments.        Patient verbalizes understanding of instructions and care plan provided today and agrees to view in MyChart. Active MyChart status and patient understanding of how to access instructions and care plan via MyChart confirmed with patient.     Telephone follow up appointment with care management team member scheduled for: The patient has been provided with contact information for the care management team and has been advised to call with any health related questions or concerns.   Please call the care guide team at [phone] if you need to cancel or reschedule your appointment.   Please call the [place]  National Suicide Prevention Lifeline: 712-365-8165 or TTY: 202-886-5628 TTY (442) 716-3543) to talk to a trained counselor call 1-800-273-TALK (toll free, 24 hour hotline) if you are experiencing a Mental Health or Behavioral Health Crisis or need someone to talk to.  Richerd Fish, RN, BSN, CCM [facility], [facility] Management Coordinator Direct Dial: [phone]

## 2024-01-04 NOTE — Transitions of Care (Post Inpatient/ED Visit) (Signed)
 Transition of Care week 2  Visit Note  01/04/2024  Name: Elizabeth Morgan MRN: [id number]          DOB: 06-02-1950  Situation: Patient enrolled in [facility] 30-day program. Visit completed with patient by telephone.   Background:   Initial Transition Care Management Follow-up Telephone Call Discharge Date and Diagnosis: 12/27/23, CHF - Congestive Heart Failure   Past Medical History:  Diagnosis Date   Actinic keratosis    Allergy    Breast cancer (HCC) 06/11/2019   DCIS   Cancer (HCC)    left breast   Family history of breast cancer    Family history of lung cancer    Hearing loss    Hernia, abdominal    showed up end of April 2018   History of herpes zoster 11/05/2015   Seasonal allergies    Squamous cell carcinoma in situ (SCCIS) 10/07/2020   left lateral zygoma Moh's 12/01/2020   Squamous cell carcinoma in situ (SCCIS) 10/07/2020   upper mid forehead, MOHs completed 12/15/20   Squamous cell carcinoma of skin 10/07/2020   L lat zygoma - SCCIS   Squamous cell carcinoma of skin 10/07/2020   upper mid forehead - SCCIS    Assessment: Patient Reported Symptoms: Cognitive Cognitive Status: No symptoms reported, Able to follow simple commands, Alert and oriented to person, place, and time, Normal speech and language skills      Neurological Neurological Review of Symptoms: No symptoms reported Neurological Management Strategies: Routine screening Neurological Self-Management Outcome: 4 (good)  HEENT HEENT Symptoms Reported: Nasal discharge (being treated for a cold) HEENT Management Strategies: Medication therapy, Routine screening HEENT Self-Management Outcome: 3 (uncertain)    Cardiovascular Cardiovascular Symptoms Reported: No symptoms reported Does patient have uncontrolled Hypertension?: No Cardiovascular Management Strategies: Medication therapy Weight: 156 lb (70.8 kg) Cardiovascular Self-Management Outcome: 4 (good)  Respiratory Respiratory Symptoms Reported:  Dry cough, Other: (runny nose - cough medicine given) Respiratory Management Strategies: Medication therapy  Endocrine Endocrine Symptoms Reported: No symptoms reported Is patient diabetic?: No Endocrine Self-Management Outcome: 4 (good)  Gastrointestinal Gastrointestinal Symptoms Reported: No symptoms reported      Genitourinary Genitourinary Symptoms Reported: No symptoms reported, Frequency Additional Genitourinary Details: normal - frequency due to Lasix    Integumentary Integumentary Symptoms Reported: No symptoms reported Skin Management Strategies: Routine screening Skin Self-Management Outcome: 4 (good)  Musculoskeletal Musculoskelatal Symptoms Reviewed: No symptoms reported        Psychosocial Psychosocial Symptoms Reported: No symptoms reported         Today's Vitals   01/04/24 1104  BP: (!) 128/57  Weight: 156 lb (70.8 kg)   Pain Scale: 0-10 Pain Score: 0-No pain  Medications Reviewed Today     Reviewed by Eilleen Richerd GRADE, RN (Registered Nurse) on 01/04/24 at 1134  Med List Status: <None>   Medication Order Taking? Sig Documenting Provider Last Dose Status Informant  alendronate  (FOSAMAX ) 70 MG tablet [id number] Yes Take 1 tablet (70 mg total) by mouth once a week. Take with a full glass of water on an empty stomach. Melanee Annah BROCKS, MD  Active   aspirin 81 MG chewable tablet [id number] Yes Chew 81 mg by mouth daily. [provider]  Active   benzonatate  (TESSALON ) 100 MG capsule [id number] Yes Take 1 capsule (100 mg total) by mouth 2 (two) times daily as needed for cough. Simmons-Robinson, Rockie, MD  Active   Calcium  Carbonate-Vitamin D 600-400 MG-UNIT tablet [id number] Yes Take 2 tablets by mouth daily. [provider]  Active            Med Note CATHY, PAULA   Wed Mar 30, 2023  9:28 AM) 1 tablet daily  cetirizine (ZYRTEC) 10 MG tablet [id number] Yes Take 10 mg by mouth daily as needed (allergies).  [provider]  Active    fluticasone (FLONASE) 50 MCG/ACT nasal spray [id number] Yes Place 1 spray into both nostrils daily as needed for allergies or rhinitis. [provider]  Active   furosemide (LASIX) 40 MG tablet [id number] Yes Take 40 mg by mouth daily. [provider]  Active   losartan (COZAAR) 25 MG tablet [id number] Yes Take 25 mg by mouth daily. [provider]  Active   meclizine  (ANTIVERT ) 12.5 MG tablet [id number] Yes Take 1 tablet (12.5 mg total) by mouth 3 (three) times daily as needed for dizziness. Simmons-Robinson, Makiera, MD  Active   metoprolol succinate (TOPROL-XL) 25 MG 24 hr tablet [id number] Yes Take 25 mg by mouth. [provider]  Active   omeprazole  (PRILOSEC) 40 MG capsule [id number] Yes TAKE 1 CAPSULE (40 MG TOTAL) BY MOUTH DAILY. Simmons-Robinson, Makiera, MD  Active   ondansetron  (ZOFRAN -ODT) 8 MG disintegrating tablet [id number] Yes Take 1 tablet (8 mg total) by mouth every 8 (eight) hours as needed for nausea or vomiting. Bradler, Evan K, MD  Active   predniSONE  (DELTASONE ) 20 MG tablet [id number] Yes Take 2 tablets (40 mg total) by mouth daily with breakfast for 3 days, THEN 1 tablet (20 mg total) daily with breakfast for 2 days. Simmons-Robinson, Makiera, MD  Active   rosuvastatin  (CRESTOR ) 10 MG tablet [id number] Yes Take 1 tablet (10 mg total) by mouth daily. Simmons-Robinson, Makiera, MD  Active   tamoxifen  (NOLVADEX ) 20 MG tablet [id number] Yes Take 1 tablet (20 mg total) by mouth daily. Melanee Annah BROCKS, MD  Active   tiZANidine  (ZANAFLEX ) 4 MG tablet [id number] Yes Take 1 tablet (4 mg total) by mouth every 6 (six) hours as needed for muscle spasms. Simmons-Robinson, Rockie, MD  Active             Recommendation:   Continue Current Plan of Care  Follow Up Plan:   Telephone follow-up in 1 week  Richerd Fish, RN, BSN, CCM [facility], [facility] Management Coordinator Direct Dial: [phone]

## 2024-01-06 ENCOUNTER — Ambulatory Visit

## 2024-01-09 ENCOUNTER — Ambulatory Visit
Admission: RE | Admit: 2024-01-09 | Discharge: 2024-01-09 | Disposition: A | Discharge status: Home / Self Care | Source: Ambulatory Visit | Attending: Family Medicine | Admitting: Family Medicine

## 2024-01-09 DIAGNOSIS — E049 Nontoxic goiter, unspecified: Secondary | ICD-10-CM | POA: Insufficient documentation

## 2024-01-13 ENCOUNTER — Telehealth: Payer: Self-pay

## 2024-01-13 ENCOUNTER — Other Ambulatory Visit: Payer: Self-pay

## 2024-01-13 NOTE — Patient Instructions (Signed)
 Visit Information  Thank you for taking time to visit with me today. Please don't hesitate to contact me if I can be of assistance to you before our next scheduled telephone appointment.  Our next appointment is by telephone on 01/24/24 at 11:00 am  Following is a copy of your care plan:   Goals Addressed             This Visit's Progress    VBCI Transitions of Care (TOC) Care Plan       Problems:  Recent Hospitalization for treatment of CHF Knowledge Deficit Related to new Congestive Heart Failure and No Hospital Follow Up Provider appointment scheduled with PCP - was able to schedule with PCP today for 01/03/24  01/04/24 Got a cold medications started by PCP 01/10/24 Cardiology [place] [facility] [place] states she feel like keeping her appointments there as she and [place] are at the beach quite often and PCP can assist if a cardiologist is needed here. Reminded about ongoing BP checks, explained about medications that can change BPs. Continue Care Plan for HF; reviewed s/s of dehydration per [facility] Cardiology note for Lasix to decrease to 20 mg, if showing signs of dehydration  Goal:  Over the next 30 days, the patient will not experience hospital readmission  Interventions:  Transitions of Care: Doctor Visits  - discussed the importance of doctor visits Reviewed new medications and uses and importance of home monitoring with weights and BPs.  Heart Failure Interventions: Basic overview and discussion of pathophysiology of Heart Failure reviewed Provided education on low sodium diet Assessed need for readable accurate scales in home Provided education about placing scale on hard, flat surface Advised patient to weigh each morning after emptying bladder Discussed importance of daily weight and advised patient to weigh and record daily Reviewed role of diuretics in prevention of fluid overload and management of heart failure; Discussed the importance of  keeping all appointments with provider Provided patient with education about the role of exercise in the management of heart failure Screening for signs and symptoms of depression related to chronic disease state  Assessed social determinant of health barriers  Reviewed the importance of resting and safety measures when getting up from lying to standing. Reviewed role of diuretics and monitoring weight daily. 01/13/24 Reviewed s/s of dehydration such as change in urine to dark color urine, dizziness, muscle cramps, dry mouth.  Patient Self Care Activities:  Attend all scheduled provider appointments Call pharmacy for medication refills 3-7 days in advance of running out of medications Call provider office for new concerns or questions  Notify RN Care Manager of TOC call rescheduling needs Participate in Transition of Care Program/Attend TOC scheduled calls Take medications as prescribed    Plan:  Telephone follow up appointment with care management team member scheduled for:  1 week 01/04/24 The care management team will reach out to the patient again over the next 5- 10 business days. The patient has been provided with contact information for the care management team and has been advised to call with any health related questions or concerns.  Discussed and offered 30 day TOC program.  Patient agrees.  The patient has been provided with contact information for the care management team and has been advised to call with any health -related questions or concerns.  The patient verbalized understanding with current plan of care.  The patient is directed to their insurance card regarding availability of benefits coverage.   01/04/24 Reviewed  AVS from MD visit and patient is for a US  Thyroid  and made her aware of that. She will follow up with PCP if she doesn't hear back for this appointment in the next week.  [place] Cad[place], planning to keep Cardiology at  [facility] Card[facility] since they go to the beach frequently. She states PCP can assist in finding cardiology in her area. Also, reminded that the cardiologist there can also refer and give specific information from them.  Patient knows and able to self-manage appointments. 01/13/24 Patient to follow up with PCP regarding US  Thyroid  was completed 01/09/24 for results. Will follow up for TOC on 01/24/23 and encouraged to call if assistance or questions before then.  Patient verbalizes she knows which providers to contact for any care needs.        Patient verbalizes understanding of instructions and care plan provided today and agrees to view in MyChart. Active MyChart status and patient understanding of how to access instructions and care plan via MyChart confirmed with patient.     The patient has been provided with contact information for the care management team and has been advised to call with any health related questions or concerns.   Please call the care guide team at [phone] if you need to cancel or reschedule your appointment.   Please call the [place]  National Suicide Prevention Lifeline: 586-040-3105 or TTY: 415-257-5477 TTY (316) 758-1351) to talk to a trained counselor if you are experiencing a Mental Health or Behavioral Health Crisis or need someone to talk to.  Richerd Fish, RN, BSN, CCM [facility], [facility] Management Coordinator Direct Dial: [phone]

## 2024-01-13 NOTE — Transitions of Care (Post Inpatient/ED Visit) (Signed)
 " Transition of Care week 3  Visit Note  01/13/2024  Name: Elizabeth Morgan MRN: [id number]          DOB: [birth year]  Situation: Patient enrolled in [facility] 30-day program. Visit completed with patient by telephone.   Background:   Initial Transition Care Management Follow-up Telephone Call Discharge Date and Diagnosis: 12/27/23, CHF - Congestive Heart Failure   Past Medical History:  Diagnosis Date   Actinic keratosis    Allergy    Breast cancer (HCC) 06/11/2019   DCIS   Cancer (HCC)    left breast   Family history of breast cancer    Family history of lung cancer    Hearing loss    Hernia, abdominal    showed up end of April 2018   History of herpes zoster 11/05/2015   Seasonal allergies    Squamous cell carcinoma in situ (SCCIS) 10/07/2020   left lateral zygoma Moh's 12/01/2020   Squamous cell carcinoma in situ (SCCIS) 10/07/2020   upper mid forehead, MOHs completed 12/15/20   Squamous cell carcinoma of skin 10/07/2020   L lat zygoma - SCCIS   Squamous cell carcinoma of skin 10/07/2020   upper mid forehead - SCCIS    Assessment: Patient Reported Symptoms: Cognitive Cognitive Status: No symptoms reported, Able to follow simple commands, Alert and oriented to person, place, and time, Normal speech and language skills, Insightful and able to interpret abstract concepts      Neurological Neurological Review of Symptoms: No symptoms reported Neurological Management Strategies: Routine screening Neurological Self-Management Outcome: 4 (good)  HEENT HEENT Symptoms Reported: No symptoms reported HEENT Management Strategies: Medication therapy, Routine screening HEENT Self-Management Outcome: 4 (good)    Cardiovascular Cardiovascular Symptoms Reported: No symptoms reported Does patient have uncontrolled Hypertension?: No Cardiovascular Management Strategies: Medication therapy Weight: 157 lb (71.2 kg) Cardiovascular Self-Management Outcome: 4 (good) Cardiovascular  Comment: my weight has been steady  Respiratory Respiratory Symptoms Reported: No symptoms reported Other Respiratory Symptoms: better over my cold Respiratory Management Strategies: Medication therapy Respiratory Self-Management Outcome: 4 (good)  Endocrine Endocrine Symptoms Reported: No symptoms reported Is patient diabetic?: No Endocrine Self-Management Outcome: 4 (good)  Gastrointestinal Gastrointestinal Symptoms Reported: No symptoms reported      Genitourinary Genitourinary Symptoms Reported: No symptoms reported, Frequency Additional Genitourinary Details: due to Lasix Genitourinary Management Strategies: Incontinence garment/pad Genitourinary Self-Management Outcome: 4 (good)  Integumentary Integumentary Symptoms Reported: No symptoms reported Skin Management Strategies: Routine screening  Musculoskeletal Musculoskelatal Symptoms Reviewed: No symptoms reported Musculoskeletal Management Strategies: Routine screening      Psychosocial Psychosocial Symptoms Reported: No symptoms reported Behavioral Health Self-Management Outcome: 4 (good) Behavioral Health Comment: going to see my new granddaughter tomorrow       Today's Vitals   01/13/24 1103  BP: 111/71  Weight: 157 lb (71.2 kg)   Pain Scale: 0-10 Pain Score: 0-No pain  Medications Reviewed Today     Reviewed by Eilleen Richerd GRADE, RN (Registered Nurse) on 01/13/24 at 1106  Med List Status: <None>   Medication Order Taking? Sig Documenting Provider Last Dose Status Informant  alendronate  (FOSAMAX ) 70 MG tablet [id number] Yes Take 1 tablet (70 mg total) by mouth once a week. Take with a full glass of water on an empty stomach. Melanee Annah BROCKS, MD  Active   aspirin 81 MG chewable tablet [id number] Yes Chew 81 mg by mouth daily. [provider]  Active   benzonatate  (TESSALON ) 100 MG capsule [id number] Yes Take 1 capsule (100 mg total)  by mouth 2 (two) times daily as needed for cough. Simmons-Robinson,  Rockie, MD  Active   Calcium  Carbonate-Vitamin D 600-400 MG-UNIT tablet [id number] Yes Take 2 tablets by mouth daily. [provider]  Active            Med Note CATHY, PAULA   Wed Mar 30, 2023  9:28 AM) 1 tablet daily  cetirizine (ZYRTEC) 10 MG tablet [id number] Yes Take 10 mg by mouth daily as needed (allergies).  [provider]  Active   fluticasone (FLONASE) 50 MCG/ACT nasal spray [id number] Yes Place 1 spray into both nostrils daily as needed for allergies or rhinitis. [provider]  Active   furosemide (LASIX) 40 MG tablet [id number] Yes Take 40 mg by mouth daily. [provider]  Active            Med Note LESLY, RICHERD GRADE   Fri Jan 13, 2024 11:05 AM) 01/10/24 [facility] Cardiology states to decrease Lasix to 20 mg if showing signs of dehydration  losartan (COZAAR) 25 MG tablet [id number] Yes Take 25 mg by mouth daily. [provider]  Active   meclizine  (ANTIVERT ) 12.5 MG tablet [id number] Yes Take 1 tablet (12.5 mg total) by mouth 3 (three) times daily as needed for dizziness. Simmons-Robinson, Makiera, MD  Active   metoprolol succinate (TOPROL-XL) 25 MG 24 hr tablet [id number] Yes Take 25 mg by mouth. [provider]  Active   omeprazole  (PRILOSEC) 40 MG capsule [id number] Yes TAKE 1 CAPSULE (40 MG TOTAL) BY MOUTH DAILY. Simmons-Robinson, Makiera, MD  Active   ondansetron  (ZOFRAN -ODT) 8 MG disintegrating tablet [id number] Yes Take 1 tablet (8 mg total) by mouth every 8 (eight) hours as needed for nausea or vomiting. Bradler, Evan K, MD  Active   rosuvastatin  (CRESTOR ) 10 MG tablet [id number] Yes Take 1 tablet (10 mg total) by mouth daily. Simmons-Robinson, Makiera, MD  Active   tamoxifen  (NOLVADEX ) 20 MG tablet [id number] Yes Take 1 tablet (20 mg total) by mouth daily. Melanee Annah BROCKS, MD  Active   tiZANidine  (ZANAFLEX ) 4 MG tablet [id number] Yes Take 1 tablet (4 mg total) by mouth every 6 (six) hours as needed for muscle spasms.  Simmons-Robinson, Rockie, MD  Active             Recommendation:   Continue Current Plan of Care Continue to monitor BP daily  Follow Up Plan:   Telephone follow-up 01/24/24 at 11:00 am  Richerd Fish, RN, BSN, CCM [facility], [facility] Management Coordinator Direct Dial: [phone]           "

## 2024-01-23 DIAGNOSIS — E042 Nontoxic multinodular goiter: Secondary | ICD-10-CM | POA: Insufficient documentation

## 2024-01-24 ENCOUNTER — Telehealth: Payer: Self-pay

## 2024-01-24 DIAGNOSIS — C50919 Malignant neoplasm of unspecified site of unspecified female breast: Secondary | ICD-10-CM

## 2024-01-24 DIAGNOSIS — E042 Nontoxic multinodular goiter: Secondary | ICD-10-CM

## 2024-01-24 DIAGNOSIS — I21A1 Myocardial infarction type 2: Secondary | ICD-10-CM

## 2024-01-24 DIAGNOSIS — I5021 Acute systolic (congestive) heart failure: Secondary | ICD-10-CM

## 2024-01-24 NOTE — Patient Instructions (Addendum)
 Visit Information  Thank you for taking time to visit with me today. Please don't hesitate to contact me if I can be of assistance to you before our next scheduled telephone appointment.  Our next appointment is being referred to Longitudinal RN CCM  Following is a copy of your care plan:   Goals Addressed             This Visit's Progress    VBCI Transitions of Care (TOC) Care Plan   On track    Problems:  Recent Hospitalization for treatment of CHF Knowledge Deficit Related to new Congestive Heart Failure and No Hospital Follow Up Provider appointment scheduled with PCP - was able to schedule with PCP today for 01/03/24  01/04/24 Got a cold medications started by PCP 01/10/24 Cardiology follow up at [facility] [place], states she feel like keeping her appointments there as she and family are at the beach quite often and PCP can assist if a cardiologist is needed here. Reminded about ongoing BP checks, explained about medications that can change BPs. Continue Care Plan for HF; reviewed s/s of dehydration per [facility] Cardiology note for Lasix to decrease to 20 mg, if showing signs of dehydration 01/24/23 Reviewed s/s of dehydration BP is 89/68 today without s/s.  Thyroid  labs reviewed from PCP and PCP is to make an Endocrinologist referral at [place] area per patient.  Goal:  Over the next 30 days, the patient will not experience hospital readmission  Interventions:  Transitions of Care: Doctor Visits  - discussed the importance of doctor visits Reviewed new medications and uses and importance of home monitoring with weights and BPs.  Heart Failure Interventions: Basic overview and discussion of pathophysiology of Heart Failure reviewed Provided education on low sodium diet Assessed need for readable accurate scales in home Provided education about placing scale on hard, flat surface Advised patient to weigh each morning after emptying bladder Discussed  importance of daily weight and advised patient to weigh and record daily Reviewed role of diuretics in prevention of fluid overload and management of heart failure; Discussed the importance of keeping all appointments with provider Provided patient with education about the role of exercise in the management of heart failure Screening for signs and symptoms of depression related to chronic disease state  Assessed social determinant of health barriers  Reviewed the importance of resting and safety measures when getting up from lying to standing. Reviewed role of diuretics and monitoring weight daily. 01/13/24 Reviewed s/s of dehydration such as change in urine to dark color urine, dizziness, muscle cramps, dry mouth.  Patient Self Care Activities:  Attend all scheduled provider appointments Call pharmacy for medication refills 3-7 days in advance of running out of medications Call provider office for new concerns or questions  Notify RN Care Manager of TOC call rescheduling needs Participate in Transition of Care Program/Attend TOC scheduled calls Take medications as prescribed    Plan:  Telephone follow up appointment with care management team member scheduled for:  1 week 01/04/24 The care management team will reach out to the patient again over the next 5- 10 business days. The patient has been provided with contact information for the care management team and has been advised to call with any health related questions or concerns.  Discussed and offered 30 day TOC program.  Patient agrees.  The patient has been provided with contact information for the care management team and has been advised to call with any health -related  questions or concerns.  The patient verbalized understanding with current plan of care.  The patient is directed to their insurance card regarding availability of benefits coverage.   01/04/24 Reviewed AVS from MD visit and patient is for a US  Thyroid  and made her aware  of that. She will follow up with PCP if she doesn't hear back for this appointment in the next week.  Patient will [place] for Cadiology appointment, planning to keep Cardiology at [facility] since they go to the beach frequently. She states PCP can assist in finding cardiology in her area. Also, reminded that the cardiologist there can also refer and give specific information from them.  Patient knows and able to self-manage appointments. 01/13/24 Patient to follow up with PCP regarding US  Thyroid  was completed 01/09/24 for results. Will follow up for TOC on 01/24/23 and encouraged to call if assistance or questions before then.  Patient verbalizes she knows which providers to contact for any care needs. 01/24/24 Patient has completed the 30 day TOC program. Patient will be referred to longitudinal CCM RN for ongoing follow up.        Patient verbalizes understanding of instructions and care plan provided today and agrees to view in MyChart. Active MyChart status and patient understanding of how to access instructions and care plan via MyChart confirmed with patient.     The patient has been provided with contact information for the care management team and has been advised to call with any health related questions or concerns.  Patient referral to longitudinal RN CCM for ongoing complex disease management follow up.  Please call the care guide team at [phone] if you need to cancel or reschedule your appointment.   Please call the [place]  National Suicide Prevention Lifeline: 334-238-3926 or TTY: 831-005-9603 TTY (779)435-2056) to talk to a trained counselor if you are experiencing a Mental Health or Behavioral Health Crisis or need someone to talk to.  Richerd Fish, RN, BSN, CCM [facility], [facility] Management Coordinator Direct Dial: [phone]

## 2024-01-24 NOTE — Transitions of Care (Post Inpatient/ED Visit) (Signed)
 " Transition of Care week 4  Visit Note  01/24/2024  Name: Elizabeth Morgan MRN: [id number]          DOB: 1950/11/29  Situation: Patient enrolled in [facility] 30-day program. Visit completed with patient by telephone.   Background:   Initial Transition Care Management Follow-up Telephone Call Discharge Date and Diagnosis: 12/27/23, CHF - Congestive Heart Failure   Past Medical History:  Diagnosis Date   Actinic keratosis    Allergy    Breast cancer (HCC) 06/11/2019   DCIS   Cancer (HCC)    left breast   Family history of breast cancer    Family history of lung cancer    Hearing loss    Hernia, abdominal    showed up end of April 2018   History of herpes zoster 11/05/2015   Seasonal allergies    Squamous cell carcinoma in situ (SCCIS) 10/07/2020   left lateral zygoma Moh's 12/01/2020   Squamous cell carcinoma in situ (SCCIS) 10/07/2020   upper mid forehead, MOHs completed 12/15/20   Squamous cell carcinoma of skin 10/07/2020   L lat zygoma - SCCIS   Squamous cell carcinoma of skin 10/07/2020   upper mid forehead - SCCIS    Assessment: Patient Reported Symptoms: Cognitive Cognitive Status: No symptoms reported, Able to follow simple commands, Alert and oriented to person, place, and time, Insightful and able to interpret abstract concepts      Neurological Neurological Review of Symptoms: No symptoms reported Neurological Management Strategies: Routine screening Neurological Self-Management Outcome: 4 (good)  HEENT HEENT Symptoms Reported: No symptoms reported HEENT Management Strategies: Medication therapy, Routine screening HEENT Self-Management Outcome: 4 (good)    Cardiovascular Cardiovascular Symptoms Reported: No symptoms reported (Reviewed s/s and denies) Does patient have uncontrolled Hypertension?: No Weight: 157 lb (71.2 kg)  Respiratory Respiratory Symptoms Reported: No symptoms reported Respiratory Management Strategies: Medication therapy, Routine screening   Endocrine Endocrine Symptoms Reported: No symptoms reported Is patient diabetic?: No    Gastrointestinal Gastrointestinal Symptoms Reported: No symptoms reported, Reflux/heartburn Additional Gastrointestinal Details: Had a little bit and started back on Prilosec Gastrointestinal Management Strategies: Medication therapy Gastrointestinal Self-Management Outcome: 4 (good)    Genitourinary Genitourinary Symptoms Reported: No symptoms reported, Frequency Additional Genitourinary Details: ongoing diuretic Genitourinary Management Strategies: Incontinence garment/pad (using for peace of mind; protection when out and about) Genitourinary Self-Management Outcome: 4 (good)  Integumentary Integumentary Symptoms Reported: No symptoms reported Skin Management Strategies: Routine screening Skin Self-Management Outcome: 4 (good)  Musculoskeletal Musculoskelatal Symptoms Reviewed: No symptoms reported Musculoskeletal Management Strategies: Routine screening Musculoskeletal Self-Management Outcome: 4 (good) Falls in the past year?: No    Psychosocial Psychosocial Symptoms Reported: No symptoms reported ([place] lot of time [place] through out the year)         Today's Vitals   01/24/24 1126  BP: (!) 89/68  Weight: 157 lb (71.2 kg)   Pain Scale: 0-10 Pain Score: 0-No pain  Medications Reviewed Today     Reviewed by Eilleen Richerd GRADE, RN (Registered Nurse) on 01/24/24 at 1118  Med List Status: <None>   Medication Order Taking? Sig Documenting Provider Last Dose Status Informant  alendronate  (FOSAMAX ) 70 MG tablet [id number] Yes Take 1 tablet (70 mg total) by mouth once a week. Take with a full glass of water on an empty stomach. Melanee Annah BROCKS, MD  Active   aspirin 81 MG chewable tablet [id number] Yes Chew 81 mg by mouth daily. [provider]  Active  benzonatate  (TESSALON ) 100 MG capsule [id number]  Take 1 capsule (100 mg total) by mouth 2 (two) times daily as needed  for cough. Simmons-Robinson, Rockie, MD  Active   Calcium  Carbonate-Vitamin D 600-400 MG-UNIT tablet [id number] Yes Take 2 tablets by mouth daily. [provider]  Active            Med Note CATHY, PAULA   Wed Mar 30, 2023  9:28 AM) 1 tablet daily  cetirizine (ZYRTEC) 10 MG tablet [id number] Yes Take 10 mg by mouth daily as needed (allergies).  [provider]  Active   fluticasone (FLONASE) 50 MCG/ACT nasal spray [id number] Yes Place 1 spray into both nostrils daily as needed for allergies or rhinitis. [provider]  Active   furosemide (LASIX) 40 MG tablet [id number] Yes Take 40 mg by mouth daily. [provider]  Active            Med Note LESLY, RICHERD GRADE   Fri Jan 13, 2024 11:05 AM) 01/10/24 [facility] Cardiology states to decrease Lasix to 20 mg if showing signs of dehydration  losartan (COZAAR) 25 MG tablet [id number] Yes Take 25 mg by mouth daily. [provider]  Active   meclizine  (ANTIVERT ) 12.5 MG tablet [id number] Yes Take 1 tablet (12.5 mg total) by mouth 3 (three) times daily as needed for dizziness. Simmons-Robinson, Makiera, MD  Active   metoprolol succinate (TOPROL-XL) 25 MG 24 hr tablet [id number] Yes Take 25 mg by mouth. [provider]  Active            Med Note LESLY, RICHERD GRADE Debar Jan 24, 2024 11:18 AM) Patient to contact Cardiology regarding medication refill  omeprazole  (PRILOSEC) 40 MG capsule [id number] Yes TAKE 1 CAPSULE (40 MG TOTAL) BY MOUTH DAILY. Simmons-Robinson, Makiera, MD  Active   ondansetron  (ZOFRAN -ODT) 8 MG disintegrating tablet [id number]  Take 1 tablet (8 mg total) by mouth every 8 (eight) hours as needed for nausea or vomiting. Bradler, Evan K, MD  Active   rosuvastatin  (CRESTOR ) 10 MG tablet [id number] Yes Take 1 tablet (10 mg total) by mouth daily. Simmons-Robinson, Makiera, MD  Active   tamoxifen  (NOLVADEX ) 20 MG tablet [id number] Yes Take 1 tablet (20 mg total) by mouth daily. Melanee Annah BROCKS, MD  Active    tiZANidine  (ZANAFLEX ) 4 MG tablet [id number] Yes Take 1 tablet (4 mg total) by mouth every 6 (six) hours as needed for muscle spasms. Simmons-Robinson, Rockie, MD  Active            Care gaps were reviewed with patient today and patient verbalized understanding to follow up with primary care provider during office visits and discuss any concerns. Patient encouraged to follow recommendations of all of her providers for vaccines.  Recommendation:   Referral to: Longitudinal CCM RN  Follow Up Plan:   Referral to RN Case Manager Closing From:  Transitions of Care Program Patient has met all care management goals. Care Management case will be closed. Patient has been provided contact information should new needs arise.   Richerd Fish, RN, BSN, CCM [facility], [facility] Management Coordinator Direct Dial: [phone]           "

## 2024-01-25 ENCOUNTER — Telehealth: Payer: Self-pay

## 2024-01-25 NOTE — Progress Notes (Signed)
 Complex Care Management Note Care Guide Note  01/25/2024 Name: Elizabeth Morgan MRN: [id number] DOB: 1950-04-15   Complex Care Management Outreach Attempts: An unsuccessful telephone outreach was attempted today to offer the patient information about available complex care management services.  Follow Up Plan:  Additional outreach attempts will be made to offer the patient complex care management information and services.   Encounter Outcome:  No Answer-Left voicemail  Leotis Rase [facility], [facility] Guide  Direct Dial: [phone]  Fax [phone]

## 2024-01-27 NOTE — Progress Notes (Signed)
 Complex Care Management Note  Care Guide Note 01/27/2024 Name: Elizabeth Morgan MRN: [id number] DOB: [birth year]  Elizabeth Morgan is a 74 y.o. year old female who sees Simmons-Robinson, Rockie, MD for primary care. I reached out to Elizabeth Morgan by phone today to offer complex care management services.  [name] was given information about Complex Care Management services today including:   The Complex Care Management services include support from the care team which includes your Nurse Care Manager, Clinical Social Worker, or Pharmacist.  The Complex Care Management team is here to help remove barriers to the health concerns and goals most important to you. Complex Care Management services are voluntary, and the patient may decline or stop services at any time by request to their care team member.   Complex Care Management Consent Status: Patient agreed to services and verbal consent obtained.   Follow up plan:  Telephone appointment with complex care management team member scheduled for:  02/10/24 @ 2 pm.  Encounter Outcome:  Patient Scheduled  Leotis Rase [facility], [facility] Guide  Direct Dial: [phone]  Fax [phone]

## 2024-02-10 ENCOUNTER — Other Ambulatory Visit: Payer: Self-pay

## 2024-02-10 NOTE — Patient Instructions (Signed)
 Visit Information  Thank you for taking time to visit with me today. Please don't hesitate to contact me if I can be of assistance to you before our next scheduled appointment.  Our next appointment is by telephone on 02/28/2024 at 1:00 pm Please call the care guide team at [phone] if you need to cancel or reschedule your appointment.   Following is a copy of your care plan:   Goals Addressed             This Visit's Progress    [facility] RN Care Plan - HF       Problems:  Chronic Disease Management support and education needs related to CHF  Goal: Over the next 90  days the Patient will attend all scheduled medical appointments: Patient will attend all appointments as evidenced by chart review        continue to work with RN Care Manager and/or Social Worker to address care management and care coordination needs related to CHF as evidenced by adherence to care management team scheduled appointments     take all medications exactly as prescribed and will call provider for medication related questions as evidenced by patient report    verbalize basic understanding of CHF disease process and self health management plan as evidenced by taking medications as prescribed, following HF Action Plan and  contacting provider as needed, checking BP and weight daily and recording along with any HF symptoms, lifestyle modifications including low salt heart healthy diet, exercise as tolerated  Interventions:   Heart Failure Interventions: Basic overview and discussion of pathophysiology of Heart Failure reviewed Provided education on low sodium diet Reviewed Heart Failure Action Plan in depth and provided written copy Advised patient to weigh each morning after emptying bladder Discussed importance of daily weight and advised patient to weigh and record daily Reviewed role of diuretics in prevention of fluid overload and management of heart failure; Discussed the importance of keeping all  appointments with provider Provided patient with education about the role of exercise in the management of heart failure Advised patient to discuss rescue plan for weight gain with provider Screening for signs and symptoms of depression related to chronic disease state  Assessed social determinant of health barriers   Patient Self-Care Activities:  Attend all scheduled provider appointments Call pharmacy for medication refills 3-7 days in advance of running out of medications Call provider office for new concerns or questions  Take medications as prescribed   Work with the social worker to address care coordination needs and will continue to work with the clinical team to address health care and disease management related needs call office if I gain more than 2 pounds in one day or 5 pounds in one week do ankle pumps when sitting keep legs up while sitting track weight in diary use salt in moderation watch for swelling in feet, ankles and legs every day weigh myself daily begin a heart failure diary bring diary to all appointments develop a rescue plan follow rescue plan if symptoms flare-up eat more whole grains, fruits and vegetables, lean meats and healthy fats know when to call the doctor:Following HF Action Plan - on refrigerator track symptoms and what helps feel better or worse dress right for the weather, hot or cold  Plan:  Telephone follow up appointment with care management team member scheduled for:  02/28/2024 at 1:00 pm             Please call the Suicide and Crisis Lifeline:  988 call the [place]  National Suicide Prevention Lifeline: 4014129713 or TTY: 725 737 1058 TTY (219)122-7718) to talk to a trained counselor call 1-800-273-TALK (toll free, 24 hour hotline) go to [facility] [address], [place] [phone]) call 911 if you are experiencing a Mental Health or Behavioral Health Crisis or need someone to talk  to.  Patient verbalized understanding of Care plan and visit instructions communicated this visit - mailing info  Elizabeth Duos, MSN, RN [facility]  [facility], [facility] RN Care Manager Direct Dial: [phone] Fax: [phone]   Heart Failure Action Plan A heart failure action plan helps you know what to do when you have symptoms of heart failure. Your action plan is a color-coded plan that lists the symptoms to watch for and indicates what actions to take. If you have symptoms in the green zone, you're doing well. If you have symptoms in the yellow zone, you're having problems. If you have symptoms in the red zone, you need medical care right away. Follow the plan that was created by you and your health care provider. Review your plan each time you visit your provider. Green zone These signs mean you're doing well and can continue what you're doing: You don't have new or worsening shortness of breath. You have very little swelling or no new swelling. Your weight is stable (no gain or loss). You have a normal activity level. You don't have chest pain or any other new symptoms. Yellow zone These signs and symptoms mean your condition may be getting worse and you should make some changes: You have trouble breathing when you're active. You have swelling in your feet or legs or have discomfort in your belly. You gain 2-3 lb (0.9-1.4 kg) in 24 hours, or 5 lb (2.3 kg) in a week. This amount may be more or less depending on your condition. You get tired easily. You have trouble sleeping. You have a dry cough. If you have any of these symptoms: Contact your provider within the next day. Your provider may adjust your medicines. Red zone These signs and symptoms mean you should get medical help right away: You have trouble breathing when resting or cannot lie flat and you need to raise your head to help you breathe. You have a dry cough that's getting  worse. You have swelling or pain in your feet or legs or discomfort in your belly that's getting worse. You suddenly gain more than 2-3 lb (0.9-1.4 kg) in 24 hours, or more than 5 lb (2.3 kg) in a week. This amount may be more or less depending on your condition. You have trouble staying awake or you feel confused. You don't have an appetite. You have worsening sadness or depression. These symptoms may be an emergency. Call 911 right away. Do not wait to see if the symptoms will go away. Do not drive yourself to the hospital. Follow these instructions at home: Take medicines only as told. Eat a heart-healthy diet. Work with a dietitian to create an eating plan that's best for you. Weigh yourself each day. Your target weight is __________ lb (__________ kg). Call your provider if you gain more than __________ lb (__________ kg) in 24 hours, or more than __________ lb (__________ kg) in a week. Health care provider name: _____________________________________________________ Health care provider phone number: _____________________________________________________ Where to find more information American Heart Association: heart.org This information is not intended to replace advice given to you by your health care provider. Make  sure you discuss any questions you have with your health care provider. Document Revised: 08/19/2022 Document Reviewed: 08/19/2022 Elsevier Patient Education  2024 Elsevier Inc.   Heart Failure: Self-Care Heart failure is a serious condition. The information below explains things you need to do to take care of yourself at home. To help you stay as healthy as possible, you may be asked to change your diet, take certain medicines, and make other changes in your life. Your health care provider may also give you more instructions. If you have problems or questions, call your provider. What are the risks? Having heart failure makes it more likely for you to have some  problems. These can get worse if you don't take good care of yourself. Problems may include: Damage to the kidneys, liver, or lungs. Not getting enough nutrients. This is called malnutrition. Heart rhythms that aren't normal. Problems with blood clotting that could cause a stroke. Supplies needed: A scale to keep track of your weight. A blood pressure monitor. A notebook. Medicines. How to care for yourself when you have heart failure Medicines Take your medicines only as told. Take them every day. Do not stop taking your medicine unless your provider tells you to. Do not skip any dose of medicine. Get your prescriptions refilled before you run out of medicine. Talk with your health care provider if you can't afford your medicines. Eating and drinking  Eat heart-healthy foods. Talk with an expert in healthy eating called a dietitian. They can help make an eating plan that's right for you. Limit salt (sodium) if told by your provider. Ask your dietitian to tell you which seasonings are healthy for your heart. Cook in healthy ways instead of frying. Healthy ways of cooking include roasting, grilling, broiling, baking, poaching, steaming, and stir-frying. Choose foods that have no trans fat and are low in saturated fat and cholesterol. Healthy choices include: Fresh or frozen fruits and vegetables. Fish. Lean meats. Legumes. Fat-free or low-fat dairy products. Whole-grain foods. High-fiber foods. Limit how much fluid you drink, if told by your provider. This may help with symptoms. Alcohol use Do not drink alcohol if: Your provider tells you not to drink. Your heart was damaged by alcohol, or you have very bad heart failure. You're pregnant, may be pregnant, or plan to become pregnant. If you drink alcohol: Limit how much you have to: 0-1 drink a day if you're female. 0-2 drinks a day if you're female. Know how much alcohol is in your drink. In the [place] one drink is one 12 oz  bottle of beer (355 mL), one 5 oz glass of wine (148 mL), or one 1 oz glass of hard liquor (44 mL). Lifestyle Do not smoke, vape, or use nicotine or tobacco. Do not use nicotine gum or patches before talking to your provider. Do not use illegal drugs. Work with your provider to stay at a healthy body weight. Lose weight if told. Do physical activity if told by your provider. Talk to your provider before you begin an exercise if: You're an older adult. You have very bad heart failure. Learn to manage stress. If you need help, ask your provider. Get physical rehabilitation, also called rehab, as needed to help you stay independent and to help with your quality of life. Take part in a cardiac rehab program. This program helps to improve your health and well-being through exercise training, education, and counseling. Plan time to rest when you get tired. Tracking important information  Weigh yourself  every day. This will help you to know if fluid is building up in your body. Weigh yourself every morning after you pee (urinate) and before you eat breakfast. Wear the same amount of clothing each time you weigh yourself. Write down your daily weight. Give your record to your provider. Check and write down your pulse and blood pressure as told by your provider. Dealing with very hot and very cold weather If the weather is very hot: Avoid activities that take a lot of energy. Use air conditioning or fans, or find a cooler place. Avoid caffeine and alcohol. Wear clothes that are loose, lightweight, and light-colored. If the weather is very cold: Avoid activities that take a lot of energy. Layer your clothes. Wear mittens or gloves, a hat, and a face covering when you go outside. Avoid alcohol. Other tips Stay up to date with shots, or vaccines. Get pneumococcal and flu (influenza) shots. Keep all follow-up visits. Your provider will watch your symptoms and change your treatment plan as  needed. Where to find more information American Heart Association: heart.org Contact a health care provider if: You gain 2-3 lb (1-1.4 kg) in 24 hours or 5 lb (2.3 kg) in a week. You have shortness of breath that's getting worse. You're not able to do your usual physical activities. You get tired easily. You cough a lot. You don't feel like eating or you feel like you may throw up. You have swelling in your hands, feet, ankles, or belly. You're not able to sleep because it's hard to breathe. You feel like your heart is beating fast. This is called palpitations. You get dizzy when you stand up. You feel depressed or sad. Get help right away if: You have trouble breathing. You or someone else notices a change in your awareness, such as having trouble staying awake or concentrating. You have pain or discomfort in your chest. You faint. These symptoms may be an emergency. Call 911 right away. Do not wait to see if the symptoms will go away. Do not drive yourself to the hospital. This information is not intended to replace advice given to you by your health care provider. Make sure you discuss any questions you have with your health care provider. Document Revised: 11/16/2022 Document Reviewed: 06/11/2022 Elsevier Patient Education  2024 Elsevier Inc.     Heart Failure: What to Know  Heart failure means that your heart isn't able to pump blood the way it should. The heart might not be able to pump enough blood and oxygen to your body tissues. Heart failure is usually a long-term condition. Be sure to take good care of yourself and follow your treatment plan. Different stages of heart failure have different treatment plans. The stages are: Stage A: At risk for heart failure. You don't have any symptoms, but you're at risk for getting heart failure. Stage B: Pre-heart failure. You don't have any symptoms, but your heart has changes that show heart failure. Stage C: Symptomatic heart  failure. You have symptoms of heart failure, and your heart has changed in ways that show heart failure. Stage D: Advanced heart failure. You have symptoms that make it hard to live your daily life, and you often need to stay in the hospital because of heart failure. What are the causes? Heart failure may be caused by: High blood pressure. Coronary artery disease. This is when cholesterol and fat build up in the arteries. Heart attack. Heart valves that don't open and close properly. Damage to  the heart muscle. An infection of the heart muscle. Lung disease. What increases the risk? Getting older. The risk of heart failure goes up as a person ages. Using tobacco or nicotine products. Being overweight. Using alcohol or drugs. Having any of these conditions: Diabetes. Abnormal heart rhythms. Thyroid  problems. Chronic kidney disease. Having a family history of heart failure. Having taken medicines that can damage the heart. What are the signs or symptoms? Symptoms of heart failure include: Shortness of breath. This may happen when doing things like climbing stairs. A cough that doesn't go away. Swelling of the feet, ankles, legs, or belly. This is called edema. Losing or gaining weight for no reason. Trouble breathing when lying flat. A fast heartbeat. Other symptoms may include: Feeling tired and not having energy. Feeling dizzy or light-headed. You may feel like you're going to faint. Not wanting to eat as much as normal. Feeling like you may vomit. Feeling confused. How is this diagnosed? Heart failure may be diagnosed based on: Your symptoms and medical history. A physical exam. Blood tests. Other tests. These may include: Chest X-ray. Electrocardiogram (ECG). Echocardiogram. Cardiac MRI. Cardiac catheterization and angiogram. How is this treated? Heart failure may be treated with: Medicines. These can be given to: Treat blood pressure, lower heart rates, or make  the heart muscle pump stronger. Cause the kidneys to remove extra salt and water from the blood through your pee. Changes in your daily life. These may include: Eating a healthy diet. Staying at a healthy weight. Quitting tobacco or drug use. Limiting or avoiding alcohol. Getting regular exercise. Taking part in a cardiac rehab program. This program helps you improve your health through exercise, education, and counseling. Surgery. Surgery can be done to: Open blocked arteries. Repair valves. Put a device in the heart. This might be a pacemaker, a device to treat abnormal heart rhythms, or a device to help the heart pump better. A heart transplant. This means getting a healthy heart from a donor. This is done when other treatments have not helped. Follow these instructions at home: Treat other conditions as told by your health care provider. These may include high blood pressure or lung disease. Learn as much as you can about heart failure. Keep all follow-up visits. Your provider will want to check on your condition. Where to find more information American Heart Association: heart.org Centers for Disease Control and Prevention: riphit.se This information is not intended to replace advice given to you by your health care provider. Make sure you discuss any questions you have with your health care provider. Document Revised: 11/16/2022 Document Reviewed: 06/07/2022 Elsevier Patient Education  2024 [employer].   Fall Prevention in the Home, Adult Falls can cause injuries and affect people of all ages. There are many simple things that you can do to make your home safe and to help prevent falls. If you need it, ask for help making these changes. What actions can I take to prevent falls? General information Use good lighting in all rooms. Make sure to: Replace any light bulbs that burn out. Turn on lights if it is dark and use night-lights. Keep items that you use often in  easy-to-reach places. Lower the shelves around your home if needed. Move furniture so that there are clear paths around it. Do not keep throw rugs or other things on the floor that can make you trip. If any of your floors are uneven, fix them. Add color or contrast paint or tape to clearly mark  and help you see: Grab bars or handrails. First and last steps of staircases. Where the edge of each step is. If you use a ladder or stepladder: Make sure that it is fully opened. Do not climb a closed ladder. Make sure the sides of the ladder are locked in place. Have someone hold the ladder while you use it. Know where your pets are as you move through your home. What can I do in the bathroom?     Keep the floor dry. Clean up any water that is on the floor right away. Remove soap buildup in the bathtub or shower. Buildup makes bathtubs and showers slippery. Use non-skid mats or decals on the floor of the bathtub or shower. Attach bath mats securely with double-sided, non-slip rug tape. If you need to sit down while you are in the shower, use a non-slip stool. Install grab bars by the toilet and in the bathtub and shower. Do not use towel bars as grab bars. What can I do in the bedroom? Make sure that you have a light by your bed that is easy to reach. Do not use any sheets or blankets on your bed that hang to the floor. Have a firm bench or chair with side arms that you can use for support when you get dressed. What can I do in the kitchen? Clean up any spills right away. If you need to reach something above you, use a sturdy step stool that has a grab bar. Keep electrical cables out of the way. Do not use floor polish or wax that makes floors slippery. What can I do with my stairs? Do not leave anything on the stairs. Make sure that you have a light switch at the top and the bottom of the stairs. Have them installed if you do not have them. Make sure that there are handrails on both sides  of the stairs. Fix handrails that are broken or loose. Make sure that handrails are as long as the staircases. Install non-slip stair treads on all stairs in your home if they do not have carpet. Avoid having throw rugs at the top or bottom of stairs, or secure the rugs with carpet tape to prevent them from moving. Choose a carpet design that does not hide the edge of steps on the stairs. Make sure that carpet is firmly attached to the stairs. Fix any carpet that is loose or worn. What can I do on the outside of my home? Use bright outdoor lighting. Repair the edges of walkways and driveways and fix any cracks. Clear paths of anything that can make you trip, such as tools or rocks. Add color or contrast paint or tape to clearly mark and help you see high doorway thresholds. Trim any bushes or trees on the main path into your home. Check that handrails are securely fastened and in good repair. Both sides of all steps should have handrails. Install guardrails along the edges of any raised decks or porches. Have leaves, snow, and ice cleared regularly. Use sand, salt, or ice melt on walkways during winter months if you live where there is ice and snow. In the garage, clean up any spills right away, including grease or oil spills. What other actions can I take? Review your medicines with your health care provider. Some medicines can make you confused or feel dizzy. This can increase your chance of falling. Wear closed-toe shoes that fit well and support your feet. Wear shoes that have rubber  soles and low heels. Use a cane, walker, scooter, or crutches that help you move around if needed. Talk with your provider about other ways that you can decrease your risk of falls. This may include seeing a physical therapist to learn to do exercises to improve movement and strength. Where to find more information Centers for Disease Control and Prevention, STEADI: tonerpromos.no [employer] on Aging:  baseringtones.pl National Institute on Aging: baseringtones.pl Contact a health care provider if: You are afraid of falling at home. You feel weak, drowsy, or dizzy at home. You fall at home. Get help right away if you: Lose consciousness or have trouble moving after a fall. Have a fall that causes a head injury. These symptoms may be an emergency. Get help right away. Call 911. Do not wait to see if the symptoms will go away. Do not drive yourself to the hospital. This information is not intended to replace advice given to you by your health care provider. Make sure you discuss any questions you have with your health care provider. Document Revised: 09/07/2021 Document Reviewed: 09/07/2021 Elsevier Patient Education  2024 [employer].

## 2024-02-10 NOTE — Patient Outreach (Signed)
 Complex Care Management   Visit Note  02/10/2024  Name:  Elizabeth Morgan MRN: [id number] DOB: 1950-06-23  Situation: Referral received for Complex Care Management related to Heart Failure I obtained verbal consent from Patient.  Visit completed with Patient  on the phone  Background:   Past Medical History:  Diagnosis Date   Actinic keratosis    Allergy    Breast cancer (HCC) 06/11/2019   DCIS   Cancer (HCC)    left breast   Family history of breast cancer    Family history of lung cancer    Hearing loss    Hernia, abdominal    showed up end of April 2018   History of herpes zoster 11/05/2015   Seasonal allergies    Squamous cell carcinoma in situ (SCCIS) 10/07/2020   left lateral zygoma Moh's 12/01/2020   Squamous cell carcinoma in situ (SCCIS) 10/07/2020   upper mid forehead, MOHs completed 12/15/20   Squamous cell carcinoma of skin 10/07/2020   L lat zygoma - SCCIS   Squamous cell carcinoma of skin 10/07/2020   upper mid forehead - SCCIS    Assessment: Patient Reported Symptoms:  Cognitive Cognitive Status: Alert and oriented to person, place, and time, Insightful and able to interpret abstract concepts, Normal speech and language skills Cognitive/Intellectual Conditions Management [RPT]: None reported or documented in medical history or problem list   Health Maintenance Behaviors: Annual physical exam, Healthy diet Healing Pattern: Average Health Facilitated by: Healthy diet  Neurological Neurological Review of Symptoms: No symptoms reported Neurological Management Strategies: Routine screening  HEENT HEENT Symptoms Reported: No symptoms reported HEENT Management Strategies: Routine screening    Cardiovascular Cardiovascular Symptoms Reported: Chest pain or discomfort Does patient have uncontrolled Hypertension?: No Weight: 159 lb (72.1 kg) Cardiovascular Comment: 01/10/24 cardiology - CP relieved with touching, improving, taking lasix 40 mg, weighs daily,  watches and contacts cardiology if not improved with watching diet that day calls provider disussed HF Action Plan and sending copy, recent hospitalization, had retained 10 lb of fluid, sx that brought her to ED - SOB and swelling in abd  Respiratory Respiratory Symptoms Reported: No symptoms reported Respiratory Management Strategies: Routine screening, Medication therapy  Endocrine Endocrine Symptoms Reported: Other Is patient diabetic?: No Endocrine Comment: 2/4 26 thyroid  bx with [facility] for nodules - no discomfort, no problems swallowing  Gastrointestinal Gastrointestinal Symptoms Reported: Reflux/heartburn Additional Gastrointestinal Details: well controlled prn med, good appetite, aware to eat low salt and limit sweets/processed foods, Gastrointestinal Management Strategies: Medication therapy    Genitourinary Genitourinary Symptoms Reported: No symptoms reported    Integumentary Integumentary Symptoms Reported: No symptoms reported    Musculoskeletal Musculoskelatal Symptoms Reviewed: No symptoms reported Musculoskeletal Management Strategies: Routine screening Falls in the past year?: No Number of falls in past year: 1 or less Was there an injury with Fall?: No Fall Risk Category Calculator: 0 Patient Fall Risk Level: Low Fall Risk Patient at Risk for Falls Due to: No Fall Risks Fall risk Follow up: Falls evaluation completed, Education provided, Falls prevention discussed  Psychosocial Psychosocial Symptoms Reported: No symptoms reported Behavioral Management Strategies: Support system   Quality of Family Relationships: helpful, involved, supportive Do you feel physically threatened by others?: No    02/10/2024    PHQ2-9 Depression Screening   Little interest or pleasure in doing things Not at all  Feeling down, depressed, or hopeless Not at all  PHQ-2 - Total Score 0  Trouble falling or staying asleep, or sleeping too  much    Feeling tired or having little energy     Poor appetite or overeating     Feeling bad about yourself - or that you are a failure or have let yourself or your family down    Trouble concentrating on things, such as reading the newspaper or watching television    Moving or speaking so slowly that other people could have noticed.  Or the opposite - being so fidgety or restless that you have been moving around a lot more than usual    Thoughts that you would be better off dead, or hurting yourself in some way    PHQ2-9 Total Score    If you checked off any problems, how difficult have these problems made it for you to do your work, take care of things at home, or get along with other people    Depression Interventions/Treatment      Today's Vitals   02/10/24 1418  Weight: 159 lb (72.1 kg)      Medications Reviewed Today     Reviewed by Devra Lands, RN (Registered Nurse) on 02/10/24 at 1413  Med List Status: <None>   Medication Order Taking? Sig Documenting Provider Last Dose Status Informant  alendronate  (FOSAMAX ) 70 MG tablet [id number] Yes Take 1 tablet (70 mg total) by mouth once a week. Take with a full glass of water on an empty stomach. Melanee Annah BROCKS, MD  Active   aspirin 81 MG chewable tablet [id number] Yes Chew 81 mg by mouth daily. [provider]  Active   benzonatate  (TESSALON ) 100 MG capsule [id number]  Take 1 capsule (100 mg total) by mouth 2 (two) times daily as needed for cough.  Patient not taking: Reported on 02/10/2024   Sharma Coyer, MD  Active   Calcium  Carbonate-Vitamin D 600-400 MG-UNIT tablet [id number] Yes Take 2 tablets by mouth daily. [provider]  Active            Med Note CATHY, PAULA   Wed Mar 30, 2023  9:28 AM) 1 tablet daily  cetirizine (ZYRTEC) 10 MG tablet [id number] Yes Take 10 mg by mouth daily as needed (allergies).  [provider]  Active   fluticasone (FLONASE) 50 MCG/ACT nasal spray [id number] Yes Place 1 spray into both nostrils daily as needed  for allergies or rhinitis. [provider]  Active   furosemide (LASIX) 40 MG tablet [id number] Yes Take 40 mg by mouth daily. [provider]  Active            Med Note LESLY, RICHERD GRADE   Fri Jan 13, 2024 11:05 AM) 01/10/24 [facility] states to decrease Lasix to 20 mg if showing signs of dehydration  losartan (COZAAR) 25 MG tablet [id number] Yes Take 25 mg by mouth daily. [provider]  Active   meclizine  (ANTIVERT ) 12.5 MG tablet [id number] Yes Take 1 tablet (12.5 mg total) by mouth 3 (three) times daily as needed for dizziness. Simmons-Robinson, Makiera, MD  Active   metoprolol succinate (TOPROL-XL) 25 MG 24 hr tablet [id number] Yes Take 25 mg by mouth. [provider]  Active            Med Note LESLY, RICHERD GRADE Debar Jan 24, 2024 11:18 AM) Patient to contact Cardiology regarding medication refill  omeprazole  (PRILOSEC) 40 MG capsule [id number] Yes TAKE 1 CAPSULE (40 MG TOTAL) BY MOUTH DAILY.  Patient taking differently: Take 40 mg by mouth daily. As needed   Sharma Coyer, MD  Active   ondansetron  (ZOFRAN -ODT) 8 MG disintegrating tablet [id number]  Take 1 tablet (8 mg total) by mouth every 8 (eight) hours as needed for nausea or vomiting.  Patient not taking: Reported on 02/10/2024   Bradler, Evan K, MD  Active   rosuvastatin  (CRESTOR ) 10 MG tablet [id number] Yes Take 1 tablet (10 mg total) by mouth daily. Simmons-Robinson, Makiera, MD  Active   tamoxifen  (NOLVADEX ) 20 MG tablet [id number] Yes Take 1 tablet (20 mg total) by mouth daily. Melanee Annah BROCKS, MD  Active   tiZANidine  (ZANAFLEX ) 4 MG tablet [id number]  Take 1 tablet (4 mg total) by mouth every 6 (six) hours as needed for muscle spasms.  Patient not taking: Reported on 02/10/2024   Sharma Coyer, MD  Active             Recommendation:   PCP Follow-up Specialty provider follow-up Thyroid  Biopsy Continue Current Plan of Care  Follow Up Plan:   Telephone  follow-up 2 Tionne Carelli  Nestora Duos, MSN, RN [facility]  [facility], [facility] RN Care Manager Direct Dial: [phone] Fax: [phone]

## 2024-02-24 ENCOUNTER — Other Ambulatory Visit: Payer: Self-pay | Admitting: Family Medicine

## 2024-02-24 DIAGNOSIS — E7800 Pure hypercholesterolemia, unspecified: Secondary | ICD-10-CM

## 2024-02-28 ENCOUNTER — Telehealth

## 2024-04-04 ENCOUNTER — Ambulatory Visit

## 2024-04-05 ENCOUNTER — Ambulatory Visit: Admitting: Family Medicine

## 2024-04-09 ENCOUNTER — Ambulatory Visit: Admitting: Family Medicine

## 2024-04-30 ENCOUNTER — Inpatient Hospital Stay: Admitting: Oncology

## 2024-11-13 ENCOUNTER — Encounter: Admitting: Dermatology
